# Patient Record
Sex: Male | Born: 1940
Health system: Southern US, Community
[De-identification: ages and names within clinical notes are randomized; demographics above are authoritative.]

## PROBLEM LIST (undated history)

## (undated) DIAGNOSIS — E785 Hyperlipidemia, unspecified: Secondary | ICD-10-CM

## (undated) DIAGNOSIS — N529 Male erectile dysfunction, unspecified: Secondary | ICD-10-CM

## (undated) DIAGNOSIS — Z860101 Personal history of adenomatous and serrated colon polyps: Secondary | ICD-10-CM

## (undated) DIAGNOSIS — Z8546 Personal history of malignant neoplasm of prostate: Secondary | ICD-10-CM

## (undated) DIAGNOSIS — N400 Enlarged prostate without lower urinary tract symptoms: Secondary | ICD-10-CM

## (undated) DIAGNOSIS — I10 Essential (primary) hypertension: Secondary | ICD-10-CM

## (undated) DIAGNOSIS — Z86010 Personal history of colonic polyps: Secondary | ICD-10-CM

## (undated) DIAGNOSIS — T7840XA Allergy, unspecified, initial encounter: Secondary | ICD-10-CM

## (undated) DIAGNOSIS — Z972 Presence of dental prosthetic device (complete) (partial): Secondary | ICD-10-CM

## (undated) DIAGNOSIS — M1990 Unspecified osteoarthritis, unspecified site: Secondary | ICD-10-CM

## (undated) HISTORY — DX: Male erectile dysfunction, unspecified: N52.9

## (undated) HISTORY — DX: Benign prostatic hyperplasia without lower urinary tract symptoms: N40.0

## (undated) HISTORY — DX: Essential (primary) hypertension: I10

## (undated) HISTORY — DX: Personal history of adenomatous and serrated colon polyps: Z86.0101

## (undated) HISTORY — DX: Hyperlipidemia, unspecified: E78.5

## (undated) HISTORY — DX: Personal history of malignant neoplasm of prostate: Z85.46

## (undated) HISTORY — DX: Personal history of colonic polyps: Z86.010

## (undated) HISTORY — DX: Unspecified osteoarthritis, unspecified site: M19.90

## (undated) HISTORY — DX: Allergy, unspecified, initial encounter: T78.40XA

---
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
## 1994-04-03 HISTORY — PX: FOOT FRACTURE SURGERY: SHX645

## 1999-05-25 ENCOUNTER — Encounter: Payer: Self-pay | Admitting: Internal Medicine

## 2004-04-06 ENCOUNTER — Ambulatory Visit: Payer: Self-pay | Admitting: Internal Medicine

## 2004-05-11 ENCOUNTER — Ambulatory Visit: Payer: Self-pay | Admitting: Family Medicine

## 2004-10-06 ENCOUNTER — Ambulatory Visit: Payer: Self-pay | Admitting: Internal Medicine

## 2004-10-06 ENCOUNTER — Emergency Department: Payer: Self-pay | Admitting: Emergency Medicine

## 2004-10-26 ENCOUNTER — Ambulatory Visit: Payer: Self-pay | Admitting: Internal Medicine

## 2004-11-09 ENCOUNTER — Ambulatory Visit: Payer: Self-pay | Admitting: Urology

## 2004-11-24 ENCOUNTER — Emergency Department: Payer: Self-pay | Admitting: Emergency Medicine

## 2004-11-28 ENCOUNTER — Ambulatory Visit: Payer: Self-pay | Admitting: Internal Medicine

## 2004-12-06 ENCOUNTER — Emergency Department: Payer: Self-pay | Admitting: Emergency Medicine

## 2004-12-09 ENCOUNTER — Emergency Department: Payer: Self-pay | Admitting: Unknown Physician Specialty

## 2004-12-14 ENCOUNTER — Ambulatory Visit: Payer: Self-pay | Admitting: Internal Medicine

## 2005-01-02 ENCOUNTER — Ambulatory Visit: Payer: Self-pay | Admitting: Internal Medicine

## 2005-02-07 ENCOUNTER — Ambulatory Visit: Payer: Self-pay | Admitting: Internal Medicine

## 2005-04-11 ENCOUNTER — Ambulatory Visit: Payer: Self-pay | Admitting: Internal Medicine

## 2005-07-21 ENCOUNTER — Ambulatory Visit: Payer: Self-pay | Admitting: Internal Medicine

## 2005-07-25 ENCOUNTER — Ambulatory Visit: Payer: Self-pay | Admitting: Internal Medicine

## 2005-07-27 ENCOUNTER — Encounter: Payer: Self-pay | Admitting: Internal Medicine

## 2005-08-19 ENCOUNTER — Ambulatory Visit: Payer: Self-pay | Admitting: Internal Medicine

## 2005-10-02 ENCOUNTER — Ambulatory Visit: Payer: Self-pay | Admitting: Orthopedic Surgery

## 2005-11-01 HISTORY — PX: KNEE ARTHROSCOPY: SHX127

## 2005-11-03 ENCOUNTER — Other Ambulatory Visit: Payer: Self-pay

## 2005-11-07 ENCOUNTER — Ambulatory Visit: Payer: Self-pay | Admitting: Internal Medicine

## 2005-11-09 ENCOUNTER — Ambulatory Visit: Payer: Self-pay | Admitting: Orthopedic Surgery

## 2005-12-08 ENCOUNTER — Ambulatory Visit: Payer: Self-pay | Admitting: Internal Medicine

## 2005-12-22 ENCOUNTER — Ambulatory Visit: Payer: Self-pay | Admitting: Orthopedic Surgery

## 2006-03-22 ENCOUNTER — Ambulatory Visit: Payer: Self-pay | Admitting: Internal Medicine

## 2006-08-30 ENCOUNTER — Encounter: Payer: Self-pay | Admitting: Internal Medicine

## 2006-11-06 ENCOUNTER — Encounter (INDEPENDENT_AMBULATORY_CARE_PROVIDER_SITE_OTHER): Payer: Self-pay | Admitting: *Deleted

## 2007-01-07 ENCOUNTER — Encounter (INDEPENDENT_AMBULATORY_CARE_PROVIDER_SITE_OTHER): Payer: Self-pay | Admitting: *Deleted

## 2007-03-04 ENCOUNTER — Encounter: Payer: Self-pay | Admitting: Internal Medicine

## 2007-03-07 DIAGNOSIS — J309 Allergic rhinitis, unspecified: Secondary | ICD-10-CM | POA: Insufficient documentation

## 2007-03-07 DIAGNOSIS — E119 Type 2 diabetes mellitus without complications: Secondary | ICD-10-CM

## 2007-03-07 DIAGNOSIS — F528 Other sexual dysfunction not due to a substance or known physiological condition: Secondary | ICD-10-CM

## 2007-03-07 DIAGNOSIS — D126 Benign neoplasm of colon, unspecified: Secondary | ICD-10-CM

## 2007-03-07 DIAGNOSIS — I10 Essential (primary) hypertension: Secondary | ICD-10-CM

## 2007-03-20 ENCOUNTER — Telehealth (INDEPENDENT_AMBULATORY_CARE_PROVIDER_SITE_OTHER): Payer: Self-pay | Admitting: *Deleted

## 2007-03-26 ENCOUNTER — Ambulatory Visit: Payer: Self-pay | Admitting: Internal Medicine

## 2007-04-08 LAB — CONVERTED CEMR LAB
Basophils Relative: 3.6 % — ABNORMAL HIGH (ref 0.0–1.0)
Chloride: 104 meq/L (ref 96–112)
Eosinophils Relative: 1.9 % (ref 0.0–5.0)
GFR calc Af Amer: 86 mL/min
GFR calc non Af Amer: 71 mL/min
Glucose, Bld: 107 mg/dL — ABNORMAL HIGH (ref 70–99)
Microalb Creat Ratio: 3.9 mg/g (ref 0.0–30.0)
Phosphorus: 3.9 mg/dL (ref 2.3–4.6)
Platelets: 199 10*3/uL (ref 150–400)
Potassium: 3.8 meq/L (ref 3.5–5.1)
RBC: 3.85 M/uL — ABNORMAL LOW (ref 4.22–5.81)
Sodium: 141 meq/L (ref 135–145)
TSH: 1.37 microintl units/mL (ref 0.35–5.50)
Total CHOL/HDL Ratio: 3.9
Triglycerides: 76 mg/dL (ref 0–149)
VLDL: 15 mg/dL (ref 0–40)
WBC: 5.1 10*3/uL (ref 4.5–10.5)

## 2007-04-25 ENCOUNTER — Ambulatory Visit: Payer: Self-pay | Admitting: Internal Medicine

## 2007-05-05 LAB — HM DIABETES EYE EXAM: HM Diabetic Eye Exam: NORMAL

## 2007-05-20 ENCOUNTER — Encounter: Payer: Self-pay | Admitting: Internal Medicine

## 2007-05-29 ENCOUNTER — Ambulatory Visit: Payer: Self-pay | Admitting: Internal Medicine

## 2007-06-18 ENCOUNTER — Telehealth: Payer: Self-pay | Admitting: Internal Medicine

## 2007-07-04 ENCOUNTER — Encounter: Payer: Self-pay | Admitting: Internal Medicine

## 2007-07-24 ENCOUNTER — Telehealth: Payer: Self-pay | Admitting: Internal Medicine

## 2007-08-01 ENCOUNTER — Encounter: Payer: Self-pay | Admitting: Internal Medicine

## 2007-08-28 ENCOUNTER — Ambulatory Visit: Payer: Self-pay | Admitting: Unknown Physician Specialty

## 2007-08-28 ENCOUNTER — Other Ambulatory Visit: Payer: Self-pay

## 2007-08-30 ENCOUNTER — Ambulatory Visit: Payer: Self-pay | Admitting: Internal Medicine

## 2007-09-02 HISTORY — PX: SINUS SURGERY WITH INSTATRAK: SHX5215

## 2007-09-10 ENCOUNTER — Ambulatory Visit: Payer: Self-pay | Admitting: Unknown Physician Specialty

## 2007-09-21 ENCOUNTER — Emergency Department: Payer: Self-pay | Admitting: Unknown Physician Specialty

## 2007-10-08 ENCOUNTER — Ambulatory Visit: Payer: Self-pay | Admitting: Internal Medicine

## 2007-10-08 DIAGNOSIS — N400 Enlarged prostate without lower urinary tract symptoms: Secondary | ICD-10-CM | POA: Insufficient documentation

## 2007-10-08 DIAGNOSIS — E785 Hyperlipidemia, unspecified: Secondary | ICD-10-CM

## 2007-10-09 LAB — CONVERTED CEMR LAB
Albumin: 4 g/dL (ref 3.5–5.2)
Basophils Absolute: 0 10*3/uL (ref 0.0–0.1)
Basophils Relative: 0.6 % (ref 0.0–1.0)
CO2: 28 meq/L (ref 19–32)
Chloride: 98 meq/L (ref 96–112)
Cholesterol: 175 mg/dL (ref 0–200)
Eosinophils Absolute: 0.1 10*3/uL (ref 0.0–0.7)
GFR calc non Af Amer: 79 mL/min
Glucose, Bld: 156 mg/dL — ABNORMAL HIGH (ref 70–99)
Hemoglobin: 13.8 g/dL (ref 13.0–17.0)
Hgb A1c MFr Bld: 7.1 % — ABNORMAL HIGH (ref 4.6–6.0)
LDL Cholesterol: 112 mg/dL — ABNORMAL HIGH (ref 0–99)
MCHC: 34.2 g/dL (ref 30.0–36.0)
MCV: 99.4 fL (ref 78.0–100.0)
Neutro Abs: 1.8 10*3/uL (ref 1.4–7.7)
Neutrophils Relative %: 46.2 % (ref 43.0–77.0)
RBC: 4.06 M/uL — ABNORMAL LOW (ref 4.22–5.81)
RDW: 13.8 % (ref 11.5–14.6)
Sodium: 135 meq/L (ref 135–145)
TSH: 0.88 microintl units/mL (ref 0.35–5.50)
Total Bilirubin: 1.3 mg/dL — ABNORMAL HIGH (ref 0.3–1.2)
Triglycerides: 95 mg/dL (ref 0–149)
VLDL: 19 mg/dL (ref 0–40)

## 2007-10-10 ENCOUNTER — Telehealth: Payer: Self-pay | Admitting: Internal Medicine

## 2007-10-14 ENCOUNTER — Telehealth (INDEPENDENT_AMBULATORY_CARE_PROVIDER_SITE_OTHER): Payer: Self-pay | Admitting: *Deleted

## 2007-10-21 ENCOUNTER — Telehealth: Payer: Self-pay | Admitting: Internal Medicine

## 2007-10-21 ENCOUNTER — Ambulatory Visit: Payer: Self-pay | Admitting: Internal Medicine

## 2007-10-21 DIAGNOSIS — G479 Sleep disorder, unspecified: Secondary | ICD-10-CM | POA: Insufficient documentation

## 2007-10-30 ENCOUNTER — Encounter: Payer: Self-pay | Admitting: Internal Medicine

## 2007-11-22 ENCOUNTER — Ambulatory Visit: Payer: Self-pay | Admitting: Internal Medicine

## 2007-11-25 ENCOUNTER — Telehealth (INDEPENDENT_AMBULATORY_CARE_PROVIDER_SITE_OTHER): Payer: Self-pay | Admitting: *Deleted

## 2008-01-20 ENCOUNTER — Telehealth: Payer: Self-pay | Admitting: Internal Medicine

## 2008-02-18 ENCOUNTER — Ambulatory Visit: Payer: Self-pay | Admitting: Internal Medicine

## 2008-04-17 ENCOUNTER — Ambulatory Visit: Payer: Self-pay | Admitting: Internal Medicine

## 2008-04-20 ENCOUNTER — Encounter: Payer: Self-pay | Admitting: Internal Medicine

## 2008-04-20 LAB — CONVERTED CEMR LAB
ALT: 27 units/L (ref 0–53)
Basophils Absolute: 0.1 10*3/uL (ref 0.0–0.1)
Chloride: 102 meq/L (ref 96–112)
Cholesterol: 165 mg/dL (ref 0–200)
Creatinine,U: 191.5 mg/dL
Eosinophils Absolute: 0.1 10*3/uL (ref 0.0–0.7)
GFR calc Af Amer: 108 mL/min
Glucose, Bld: 104 mg/dL — ABNORMAL HIGH (ref 70–99)
HCT: 40.7 % (ref 39.0–52.0)
Hemoglobin: 14.4 g/dL (ref 13.0–17.0)
MCHC: 35.3 g/dL (ref 30.0–36.0)
MCV: 97.7 fL (ref 78.0–100.0)
Microalb, Ur: 1.4 mg/dL (ref 0.0–1.9)
Monocytes Absolute: 0.4 10*3/uL (ref 0.1–1.0)
Monocytes Relative: 7.4 % (ref 3.0–12.0)
Neutro Abs: 2.2 10*3/uL (ref 1.4–7.7)
Phosphorus: 3.3 mg/dL (ref 2.3–4.6)
Potassium: 3.6 meq/L (ref 3.5–5.1)
RDW: 13.1 % (ref 11.5–14.6)
Sodium: 139 meq/L (ref 135–145)
TSH: 0.98 microintl units/mL (ref 0.35–5.50)
Total Bilirubin: 1.7 mg/dL — ABNORMAL HIGH (ref 0.3–1.2)
Triglycerides: 57 mg/dL (ref 0–149)

## 2008-05-18 ENCOUNTER — Telehealth: Payer: Self-pay | Admitting: Internal Medicine

## 2008-07-13 ENCOUNTER — Telehealth: Payer: Self-pay | Admitting: Internal Medicine

## 2008-07-28 ENCOUNTER — Encounter: Payer: Self-pay | Admitting: Internal Medicine

## 2008-08-14 ENCOUNTER — Telehealth: Payer: Self-pay | Admitting: Internal Medicine

## 2008-09-08 ENCOUNTER — Telehealth: Payer: Self-pay | Admitting: Internal Medicine

## 2008-09-10 ENCOUNTER — Ambulatory Visit: Payer: Self-pay | Admitting: Internal Medicine

## 2008-09-21 ENCOUNTER — Telehealth: Payer: Self-pay | Admitting: Internal Medicine

## 2008-10-19 ENCOUNTER — Encounter: Payer: Self-pay | Admitting: Internal Medicine

## 2008-11-10 ENCOUNTER — Telehealth: Payer: Self-pay | Admitting: Internal Medicine

## 2008-11-18 ENCOUNTER — Encounter: Payer: Self-pay | Admitting: Internal Medicine

## 2008-11-25 ENCOUNTER — Encounter: Payer: Self-pay | Admitting: Internal Medicine

## 2008-12-02 ENCOUNTER — Ambulatory Visit: Payer: Self-pay | Admitting: Radiation Oncology

## 2008-12-08 ENCOUNTER — Telehealth: Payer: Self-pay | Admitting: Internal Medicine

## 2008-12-14 ENCOUNTER — Encounter: Payer: Self-pay | Admitting: Internal Medicine

## 2008-12-21 ENCOUNTER — Encounter: Payer: Self-pay | Admitting: Internal Medicine

## 2008-12-21 ENCOUNTER — Ambulatory Visit: Payer: Self-pay | Admitting: Radiation Oncology

## 2008-12-22 ENCOUNTER — Ambulatory Visit: Payer: Self-pay | Admitting: Internal Medicine

## 2008-12-22 DIAGNOSIS — Z8546 Personal history of malignant neoplasm of prostate: Secondary | ICD-10-CM

## 2008-12-24 LAB — CONVERTED CEMR LAB
Basophils Absolute: 0 10*3/uL (ref 0.0–0.1)
CO2: 31 meq/L (ref 19–32)
Calcium: 9.1 mg/dL (ref 8.4–10.5)
Eosinophils Absolute: 0.1 10*3/uL (ref 0.0–0.7)
HCT: 40.9 % (ref 39.0–52.0)
HDL: 45 mg/dL (ref >39.00)
Hemoglobin: 14 g/dL (ref 13.0–17.0)
Lymphs Abs: 2 10*3/uL (ref 0.7–4.0)
MCHC: 34.1 g/dL (ref 30.0–36.0)
Microalb Creat Ratio: 2.3 mg/g (ref 0.0–30.0)
Neutro Abs: 2 10*3/uL (ref 1.4–7.7)
Potassium: 3.7 meq/L (ref 3.5–5.1)
RDW: 12.7 % (ref 11.5–14.6)
Sodium: 140 meq/L (ref 135–145)
Total Bilirubin: 2.4 mg/dL — ABNORMAL HIGH (ref 0.3–1.2)
Total CHOL/HDL Ratio: 3
VLDL: 15.4 mg/dL (ref 0.0–40.0)

## 2008-12-30 ENCOUNTER — Ambulatory Visit: Payer: Self-pay | Admitting: Internal Medicine

## 2008-12-30 ENCOUNTER — Telehealth: Payer: Self-pay | Admitting: Internal Medicine

## 2009-01-01 ENCOUNTER — Ambulatory Visit: Payer: Self-pay | Admitting: Radiation Oncology

## 2009-01-01 HISTORY — PX: PROSTATE SURGERY: SHX751

## 2009-01-12 ENCOUNTER — Ambulatory Visit: Payer: Self-pay | Admitting: Radiation Oncology

## 2009-01-13 ENCOUNTER — Telehealth: Payer: Self-pay | Admitting: Internal Medicine

## 2009-02-01 ENCOUNTER — Ambulatory Visit: Payer: Self-pay | Admitting: Radiation Oncology

## 2009-02-02 ENCOUNTER — Ambulatory Visit: Payer: Self-pay | Admitting: Urology

## 2009-02-04 ENCOUNTER — Ambulatory Visit: Payer: Self-pay | Admitting: Cardiology

## 2009-02-09 ENCOUNTER — Ambulatory Visit: Payer: Self-pay | Admitting: Radiation Oncology

## 2009-02-17 ENCOUNTER — Telehealth: Payer: Self-pay | Admitting: Internal Medicine

## 2009-03-03 ENCOUNTER — Ambulatory Visit: Payer: Self-pay | Admitting: Radiation Oncology

## 2009-04-03 ENCOUNTER — Ambulatory Visit: Payer: Self-pay | Admitting: Radiation Oncology

## 2009-04-21 ENCOUNTER — Encounter: Payer: Self-pay | Admitting: Internal Medicine

## 2009-05-04 ENCOUNTER — Telehealth: Payer: Self-pay | Admitting: Internal Medicine

## 2009-06-21 ENCOUNTER — Ambulatory Visit: Payer: Self-pay | Admitting: Internal Medicine

## 2009-06-22 LAB — CONVERTED CEMR LAB
AST: 33 units/L (ref 0–37)
Albumin: 3.9 g/dL (ref 3.5–5.2)
Alkaline Phosphatase: 58 units/L (ref 39–117)
BUN: 16 mg/dL (ref 6–23)
Basophils Absolute: 0 10*3/uL (ref 0.0–0.1)
Bilirubin, Direct: 0.2 mg/dL (ref 0.0–0.3)
CO2: 29 meq/L (ref 19–32)
Cholesterol: 151 mg/dL (ref 0–200)
Creatinine, Ser: 0.9 mg/dL (ref 0.4–1.5)
Creatinine,U: 181.7 mg/dL
Eosinophils Absolute: 0.1 10*3/uL (ref 0.0–0.7)
Glucose, Bld: 124 mg/dL — ABNORMAL HIGH (ref 70–99)
Lymphs Abs: 1.2 10*3/uL (ref 0.7–4.0)
MCHC: 34 g/dL (ref 30.0–36.0)
MCV: 99.8 fL (ref 78.0–100.0)
Microalb Creat Ratio: 8.8 mg/g (ref 0.0–30.0)
Monocytes Absolute: 0.3 10*3/uL (ref 0.1–1.0)
Neutrophils Relative %: 52 % (ref 43.0–77.0)
Platelets: 179 10*3/uL (ref 150.0–400.0)
RDW: 13.3 % (ref 11.5–14.6)
TSH: 1.19 microintl units/mL (ref 0.35–5.50)
Total CHOL/HDL Ratio: 3
Triglycerides: 54 mg/dL (ref 0.0–149.0)
WBC: 3.4 10*3/uL — ABNORMAL LOW (ref 4.5–10.5)

## 2009-06-23 ENCOUNTER — Encounter: Payer: Self-pay | Admitting: Internal Medicine

## 2009-06-30 ENCOUNTER — Encounter (INDEPENDENT_AMBULATORY_CARE_PROVIDER_SITE_OTHER): Payer: Self-pay | Admitting: *Deleted

## 2009-07-16 ENCOUNTER — Telehealth: Payer: Self-pay | Admitting: Internal Medicine

## 2009-08-09 ENCOUNTER — Encounter: Payer: Self-pay | Admitting: Internal Medicine

## 2009-09-10 ENCOUNTER — Telehealth: Payer: Self-pay | Admitting: Internal Medicine

## 2009-12-16 ENCOUNTER — Encounter: Payer: Self-pay | Admitting: Internal Medicine

## 2009-12-20 ENCOUNTER — Telehealth: Payer: Self-pay | Admitting: Internal Medicine

## 2009-12-22 ENCOUNTER — Ambulatory Visit: Payer: Self-pay | Admitting: Internal Medicine

## 2009-12-22 DIAGNOSIS — M1990 Unspecified osteoarthritis, unspecified site: Secondary | ICD-10-CM | POA: Insufficient documentation

## 2009-12-22 LAB — HM DIABETES FOOT EXAM

## 2009-12-23 LAB — CONVERTED CEMR LAB: Hgb A1c MFr Bld: 6.3 % (ref 4.6–6.5)

## 2010-01-20 ENCOUNTER — Encounter: Payer: Self-pay | Admitting: Internal Medicine

## 2010-02-15 ENCOUNTER — Encounter (INDEPENDENT_AMBULATORY_CARE_PROVIDER_SITE_OTHER): Payer: Self-pay | Admitting: *Deleted

## 2010-02-17 ENCOUNTER — Ambulatory Visit: Payer: Self-pay | Admitting: Internal Medicine

## 2010-02-22 ENCOUNTER — Telehealth: Payer: Self-pay | Admitting: Internal Medicine

## 2010-03-03 ENCOUNTER — Ambulatory Visit: Payer: Self-pay | Admitting: Internal Medicine

## 2010-04-29 ENCOUNTER — Telehealth: Payer: Self-pay | Admitting: Internal Medicine

## 2010-05-04 NOTE — Letter (Signed)
Summary: Pre Visit No Show Letter  [facility]  [address] [place], [place] [address]   Phone: [phone]  Fax: [phone]        Aug 09, 2009 MRN: [id number]    [facility] [address] [place], [place]  [address]    Dear Rodney Suarez,   We have been unable to reach you by phone concerning the pre-procedure visit that you missed on 08/09/09. For this reason,your procedure scheduled on 08/23/09 has been cancelled. Our scheduling staff will gladly assist you with rescheduling your appointments at a more convenient time. Please call our office at [phone] between the hours of 8:00am and 5:00pm, press option #2 to reach an appointment scheduler. Please consider updating your contact numbers at this time so that we can reach you by phone in the future with schedule changes or results.    Thank you,    Wyona Almas RN [facility]

## 2010-05-04 NOTE — Assessment & Plan Note (Signed)
Summary: FOLLOW UP / LFW   Vital Signs:  Patient profile:   70 year old male Height:      72 inches Weight:      213 pounds BMI:     28.99 Temp:     98.7 degrees F oral Pulse rate:   72 / minute Pulse rhythm:   regular BP sitting:   160 / 100  (right arm) Cuff size:   large  Vitals Entered By: Mervin Hack CMA Duncan Dull) (December 22, 2009 10:36 AM) CC: six month follow up   History of Present Illness: DOing well Has had recent follow up with Dr Cope---has done well PSA very low BP was fine there  got disturbing phone call before visit may be reason for elevated BP today  checks sugars 3-4 times per week Usually under 110 Has cut out junk in diet and lost 12# rides bike daily Has eye appt  No depression Rarely needs lorazepam in day  Allergies: 1)  Riomet (Metformin Hcl)  Past History:  Past medical, surgical, family and social histories (including risk factors) reviewed for relevance to current acute and chronic problems.  Past Medical History: Allergic rhinitis Diabetes mellitus, type II Hypertension Colonic polyps, hx of--adenomatous Erectile dysfunction Benign prostatic hypertrophy Hyperlipidemia Prostate cancer, hx of----------------------------------------Dr Cope Osteoarthritis  Past Surgical History: Reviewed history from 06/21/2009 and no changes required. Appendectomy 1956 Fx right foot 1996 Right knee arthroscopy 08/07 Sinus surgery 6/09 I-131 implants 10/10 for prostate cancer  Family History: Reviewed history from 03/07/2007 and no changes required. Dad died in MVA Mom okay DM in family No CAD No cancer  Social History: Reviewed history from 03/07/2007 and no changes required. 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   Neck:  supple, no masses, no thyromegaly, no carotid bruits, and no cervical lymphadenopathy.   Lungs:  normal respiratory effort, no intercostal retractions, no accessory muscle use, and normal breath sounds.   Heart:  normal rate, regular rhythm, no murmur, and no gallop.   Msk:  no joint tenderness and no joint swelling.   Pulses:  2+ in each foot Extremities:  no edema Psych:  normally interactive, good eye contact, not anxious appearing, and not depressed appearing.    Diabetes Management Exam:    Foot Exam (with socks and/or shoes not present):       Sensory-Pinprick/Light touch:          Left medial foot (L-4): normal          Left dorsal foot (L-5): normal          Left lateral foot (S-1): normal          Right medial foot (L-4): normal          Right dorsal foot (L-5): normal          Right lateral foot (S-1): normal       Inspection:          Left foot: normal          Right foot: normal       Nails:          Left foot: normal          Right foot: normal   Impression &  Recommendations:  Problem # 1:  DIABETES MELLITUS, TYPE II (ICD-250.00) Assessment Unchanged  good control without meds will check A1c  His updated medication list for this problem includes:    Lisinopril-hydrochlorothiazide 20-12.5 Mg Tabs (Lisinopril-hydrochlorothiazide) .Marland Kitchen... Take 2 tablet by mouth once a day  Labs Reviewed: Creat: 0.9 (06/21/2009)     Last Eye Exam: normal (05/05/2007) Reviewed HgBA1c results: 6.4 (06/21/2009)  6.0 (12/22/2008)  Orders: Venipuncture (91478) TLB-A1C / Hgb A1C (Glycohemoglobin) (83036-A1C)  Problem # 2:  HYPERTENSION (ICD-401.9) Assessment: Comment Only up today but has been okay no changes  His updated medication list for this problem includes:    Lisinopril-hydrochlorothiazide 20-12.5 Mg Tabs (Lisinopril-hydrochlorothiazide) .Marland Kitchen... Take 2 tablet by mouth once a day    Norvasc 5 Mg Tabs (Amlodipine  besylate) .Marland Kitchen... Take 1 tablet by mouth once a day  BP today: 160/100 Prior BP: 132/78 (06/21/2009)  Labs Reviewed: K+: 3.6 (06/21/2009) Creat: : 0.9 (06/21/2009)   Chol: 151 (06/21/2009)   HDL: 59.90 (06/21/2009)   LDL: 80 (06/21/2009)   TG: 54.0 (06/21/2009)  Problem # 3:  HYPERLIPIDEMIA (ICD-272.4) Assessment: Unchanged at goal recheck next time  His updated medication list for this problem includes:    Pravastatin Sodium 20 Mg Tabs (Pravastatin sodium) .Marland Kitchen... Take one tablet by mouth once daily  Labs Reviewed: SGOT: 33 (06/21/2009)   SGPT: 40 (06/21/2009)   HDL:59.90 (06/21/2009), 45.00 (12/22/2008)  LDL:80 (06/21/2009), 87 (12/22/2008)  Chol:151 (06/21/2009), 147 (12/22/2008)  Trig:54.0 (06/21/2009), 77.0 (12/22/2008)  Problem # 4:  SLEEP DISORDER (ICD-780.50) Assessment: Unchanged uses lorazepam as needed   Problem # 5:  OSTEOARTHRITIS (ICD-715.90) Assessment: Improved knee better with weight loss  Complete Medication List: 1)  Lisinopril-hydrochlorothiazide 20-12.5 Mg Tabs (Lisinopril-hydrochlorothiazide) .... Take 2 tablet by mouth once a day 2)  Norvasc 5 Mg Tabs (Amlodipine besylate) .... Take 1 tablet by mouth once a day 3)  Pravastatin Sodium 20 Mg Tabs (Pravastatin sodium) .... Take one tablet by mouth once daily 4)  Lorazepam 1 Mg Tabs (Lorazepam) .Marland Kitchen.. 1-2 at bedtime to help sleep 5)  Viagra 100 Mg Tabs (Sildenafil citrate) .... Administer 1/2-1 tablet by mouth once a day 6)  Ketoconazole 2 % Crea (Ketoconazole) .... Apply two times a day to rash till clear  Other Orders: Flu Vaccine 59yrs + [phone]) Admin 1st Vaccine (13086) Admin 1st Vaccine [facility]) [phone])  Patient Instructions: 1)  Please schedule a follow-up appointment in 6 months for physical  Current Allergies (reviewed today): RIOMET (METFORMIN HCL)   Influenza Vaccine    Vaccine Type: Fluvax 3+    Site: left deltoid    Mfr: GlaxoSmithKline    Dose: 0.5 ml    Route: IM    Given by: Mervin Hack CMA (AAMA)    Exp. Date: 10/01/2010    Lot #: [id number]    VIS given: 10/26/09 version given December 22, 2009.  Flu Vaccine Consent Questions    Do you have a history of severe allergic reactions to this vaccine? no    Any prior history of allergic reactions to egg and/or gelatin? no    Do you have a sensitivity to the preservative Thimersol? no    Do you have a past history of Guillan-Barre Syndrome? no    Do you currently have an acute febrile illness? no    Have you ever had a severe reaction to latex? no    Vaccine information given and explained to patient? yes

## 2010-05-04 NOTE — Letter (Signed)
Summary: [facility] Instructions  [facility]  [address] [place], [place] [address]   Phone: [phone]  Fax: [phone]       Rodney Suarez    06/06/1940    MRN: [id number]        Procedure Day /Date:  Thursday 03/03/2010     Arrival Time:  8:30 am      Procedure Time: 9:30 am     Location of Procedure:                    _x _  [facility] (4th Floor)                        PREPARATION FOR COLONOSCOPY WITH MOVIPREP   Starting 5 days prior to your procedure Saturday 11/26 do not eat nuts, seeds, popcorn, corn, beans, peas,  salads, or any raw vegetables.  Do not take any fiber supplements (e.g. Metamucil, Citrucel, and Benefiber).  THE DAY BEFORE YOUR PROCEDURE         DATE: Wednesday 11/30  1.  Drink clear liquids the entire day-NO SOLID FOOD  2.  Do not drink anything colored red or purple.  Avoid juices with pulp.  No orange juice.  3.  Drink at least 64 oz. (8 glasses) of fluid/clear liquids during the day to prevent dehydration and help the prep work efficiently.  CLEAR LIQUIDS INCLUDE: Water Jello Ice Popsicles Tea (sugar ok, no milk/cream) Powdered fruit flavored drinks Coffee (sugar ok, no milk/cream) Gatorade Juice: apple, white grape, white cranberry  Lemonade Clear bullion, consomm, broth Carbonated beverages (any kind) Strained chicken noodle soup Hard Candy                             4.  In the morning, mix first dose of MoviPrep solution:    Empty 1 Pouch A and 1 Pouch B into the disposable container    Add lukewarm drinking water to the top line of the container. Mix to dissolve    Refrigerate (mixed solution should be used within 24 hrs)  5.  Begin drinking the prep at 5:00 p.m. The MoviPrep container is divided by 4 marks.   Every 15 minutes drink the solution down to the next mark (approximately 8 oz) until the full liter is complete.   6.  Follow completed prep with 16 oz of clear liquid of your choice  (Nothing red or purple).  Continue to drink clear liquids until bedtime.  7.  Before going to bed, mix second dose of MoviPrep solution:    Empty 1 Pouch A and 1 Pouch B into the disposable container    Add lukewarm drinking water to the top line of the container. Mix to dissolve    Refrigerate  THE DAY OF YOUR PROCEDURE      DATE: Thursday 12/1  Beginning at 4:30 a.m. (5 hours before procedure):         1. Every 15 minutes, drink the solution down to the next mark (approx 8 oz) until the full liter is complete.  2. Follow completed prep with 16 oz. of clear liquid of your choice.    3. You may drink clear liquids until 7:30 am (2 HOURS BEFORE PROCEDURE).   MEDICATION INSTRUCTIONS  Unless otherwise instructed, you should take regular prescription medications with a small sip of water   as early as possible the morning  of your procedure.   Additional medication instructions: _HOLD your lisinopril/HCTZ the am of your procedure         OTHER INSTRUCTIONS  You will need a responsible adult at least 71 years of age to accompany you and drive you home.   This person must remain in the waiting room during your procedure.  Wear loose fitting clothing that is easily removed.  Leave jewelry and other valuables at home.  However, you may wish to bring a book to read or  an iPod/MP3 player to listen to music as you wait for your procedure to start.  Remove all body piercing jewelry and leave at home.  Total time from sign-in until discharge is approximately 2-3 hours.  You should go home directly after your procedure and rest.  You can resume normal activities the  day after your procedure.  The day of your procedure you should not:   Drive   Make legal decisions   Operate machinery   Drink alcohol   Return to work  You will receive specific instructions about eating, activities and medications before you leave.    The above instructions have been reviewed and  explained to me by   Clide Cliff, RN______________________    I fully understand and can verbalize these instructions _____________________________ Date _________

## 2010-05-04 NOTE — Progress Notes (Signed)
Summary: refill request for lorazepam  Phone Note Refill Request Message from:  Fax from Pharmacy  Refills Requested: Medication #1:  LORAZEPAM 1 MG  TABS 1-2 at bedtime to help sleep   Last Refilled: 01/22/2010 [facility] [address] [facility] on your desk.  Initial call taken by: Lowella Petties CMA, AAMA,  February 22, 2010 8:20 AM  Follow-up for Phone Call        okay #60 x 1 Follow-up by: Cindee Salt MD,  February 22, 2010 8:54 AM  Additional Follow-up for Phone Call Additional follow up Details #1::        Rx faxed to pharmacy Additional Follow-up by: DeShannon Holden CMA Duncan Dull),  February 22, 2010 10:00 AM    Prescriptions: LORAZEPAM 1 MG  TABS (LORAZEPAM) 1-2 at bedtime to help sleep  #60 x 1   Entered by:   Mervin Hack CMA (AAMA)   Authorized by:   Cindee Salt MD   Signed by:   Mervin Hack CMA (AAMA) on 02/22/2010   Method used:   Handwritten   RxID:   [id number]

## 2010-05-04 NOTE — Letter (Signed)
Summary: Pre Visit Letter Revised  [facility]  [address] [place], [place] [address]   Phone: [phone]  Fax: [phone]        01/20/2010 MRN: [id number]  [facility] [address] [place], [place]  [address]                            Procedure Date:  12-1at 9:30am Welcome to the Gastroenterology Division at [facility].    You are scheduled to see a nurse for your pre-procedure visit on 02-17-10 at 10am on the 3rd floor at [facility], [address]  We ask that you try to arrive at our office 15 minutes prior to your appointment time to allow for check-in.  Please take a minute to review the attached form.  If you answer "Yes" to one or more of the questions on the first page, we ask that you call the person listed at your earliest opportunity.  If you answer "No" to all of the questions, please complete the rest of the form and bring it to your appointment.    Your nurse visit will consist of discussing your medical and surgical history, your immediate family medical history, and your medications.   If you are unable to list all of your medications on the form, please bring the medication bottles to your appointment and we will list them.  We will need to be aware of both prescribed and over the counter drugs.  We will need to know exact dosage information as well.    Please be prepared to read and sign documents such as consent forms, a financial agreement, and acknowledgement forms.  If necessary, and with your consent, a friend or relative is welcome to sit-in on the nurse visit with you.  Please bring your insurance card so that we may make a copy of it.  If your insurance requires a referral to see a specialist, please bring your referral form from your primary care physician.  No co-pay is required for this nurse visit.     If you cannot keep your appointment, please call [phone] to cancel or reschedule prior to your appointment date.   This allows [place] the opportunity to schedule an appointment for another patient in need of care.    Thank you for choosing  Gastroenterology for your medical needs.  We appreciate the opportunity to care for you.  Please visit [place] at our website  to learn more about our practice.  Sincerely, The Gastroenterology Division

## 2010-05-04 NOTE — Letter (Signed)
Summary: [facility]  [facility]   Imported By: Lanelle Bal 12/28/2009 08:32:18  _____________________________________________________________________  External Attachment:    Type:   Image     Comment:   External Document  Appended Document: [facility] Prostate cancer follow up PSA drawn SOme issues with ED as well

## 2010-05-04 NOTE — Letter (Signed)
Summary: [facility]  [facility]   Imported By: Lanelle Bal 04/27/2009 13:57:03  _____________________________________________________________________  External Attachment:    Type:   Image     Comment:   External Document  Appended Document: [facility] doing well after prostate brachytherapy viagra sample given

## 2010-05-04 NOTE — Progress Notes (Signed)
Summary: LORAZEPAM  Phone Note [facility] 04[facility] 3:55 PM  Refills Requested: Medication #1:  LORAZEPAM 1 MG  TABS 1-2 at bedtime to help sleep   Last Refilled: 02/17/2009 Form on your desk    Method Requested: Fax to Local Pharmacy Initial call taken by: DeShannon Colvin CMA Duncan Dull),  May 04, 2009 3:55 PM  Follow-up for Phone Call        okay #60 x 1 Follow-up by: Cindee Salt MD,  May 05, 2009 10:26 AM  Additional Follow-up for Phone Call Additional follow up Details #1::        Rx faxed to pharmacy Additional Follow-up by: DeShannon Moilanen CMA Duncan Dull),  May 05, 2009 10:50 AM    Prescriptions: LORAZEPAM 1 MG  TABS (LORAZEPAM) 1-2 at bedtime to help sleep  #60 x 1   Entered by:   Mervin Hack CMA (AAMA)   Authorized by:   Cindee Salt MD   Signed by:   Mervin Hack CMA (AAMA) on 05/05/2009   Method used:   Handwritten   RxID:   [id number]

## 2010-05-04 NOTE — Miscellaneous (Signed)
Summary: RECALL COLON/YF  Clinical Lists Changes  Medications: Added new medication of MOVIPREP 100 GM  SOLR (PEG-KCL-NACL-NASULF-NA ASC-C) As directed - Signed Rx of MOVIPREP 100 GM  SOLR (PEG-KCL-NACL-NASULF-NA ASC-C) As directed;  #1 x 0;  Signed;  Entered by: Clide Cliff RN;  Authorized by: Hilarie Fredrickson MD;  Method used: [facility]. [phone]*, [address], [place], [place], [place]  [address], Ph: [phone] or [phone], Fax: [phone] Observations: Added new observation of ALLERGY REV: Done (02/17/2010 9:41)    Prescriptions: MOVIPREP 100 GM  SOLR (PEG-KCL-NACL-NASULF-NA ASC-C) As directed  #1 x 0   Entered by:   Clide Cliff RN   Authorized by:   Hilarie Fredrickson MD   Signed by:   Clide Cliff RN on 02/17/2010   Method used:   [facility] to        [facility] [phone]* (retail)       [address] [place], [place]  [address]       Ph: [phone] or [phone]       Fax: [phone]   RxID:   [id number]

## 2010-05-04 NOTE — Assessment & Plan Note (Signed)
Summary: FOLLOW UP / LFW   Vital Signs:  Patient profile:   70 year old male Weight:      225 pounds BMI:     30.63 Temp:     98 degrees F oral Pulse rate:   64 / minute Pulse rhythm:   regular BP sitting:   132 / 78  (left arm) Cuff size:   large  Vitals Entered By: Mervin Hack CMA Duncan Dull) (June 21, 2009 10:34 AM) CC: 6 month follow-up   History of Present Illness: Had implants put in in October went well PSA down now No apparent side effects---no pelvic pain, no rectal bleeding No urinary problems Nocturia only once--rarely twice  Checking sugars every morning Doing great  105-115 Has cut out all the junk Rides bike fairly regularly Due for eye exam  No chest pain  No SOB No edema  Allergies: 1)  Riomet (Metformin Hcl)  Past History:  Past medical, surgical, family and social histories (including risk factors) reviewed for relevance to current acute and chronic problems.  Past Medical History: Reviewed history from 12/22/2008 and no changes required. Allergic rhinitis Diabetes mellitus, type II Hypertension Colonic polyps, hx of--adenomatous Erectile dysfunction Benign prostatic hypertrophy Hyperlipidemia Prostate cancer, hx of----------------------------------------Dr Achilles Dunk  Past Surgical History: Appendectomy 1956 Fx right foot 1996 Right knee arthroscopy 08/07 Sinus surgery 6/09 I-131 implants 10/10 for prostate cancer  Family History: Reviewed history from 03/07/2007 and no changes required. Dad died in MVA Mom okay DM in family No CAD No cancer  Social History: Reviewed history from 03/07/2007 and no changes required. 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   Neck:  supple, no masses, no thyromegaly, no carotid bruits, and no cervical lymphadenopathy.   Lungs:  normal respiratory effort and normal breath sounds.   Heart:  normal rate, regular rhythm, no murmur, and no gallop.   Abdomen:  soft and non-tender.   Msk:  no joint tenderness and no joint swelling.   Skin:  no suspicious lesions and no ulcerations.   Psych:  normally interactive, good eye contact, not anxious appearing, and not depressed appearing.    Diabetes Management Exam:    Foot Exam (with socks and/or shoes not present):       Sensory-Pinprick/Light touch:          Left medial foot (L-4): normal          Left dorsal foot (L-5): normal          Left lateral foot (S-1): normal          Right medial foot (L-4): normal          Right dorsal foot (L-5): normal          Right lateral foot (S-1): normal       Inspection:          Left foot: normal          Right foot: normal       Nails:          Left foot: normal          Right foot: normal   Impression & Recommendations:  Problem # 1:  DIABETES MELLITUS, TYPE II (ICD-250.00) Assessment Comment Only  seems to be controlled without meds will check labs getting eye exam soon  His updated medication list for this problem includes:    Lisinopril-hydrochlorothiazide 20-12.5 Mg Tabs (Lisinopril-hydrochlorothiazide) .Marland Kitchen... Take 2 tablet by mouth once a day  Labs Reviewed: Creat: 0.9 (12/22/2008)     Last Eye Exam: normal (05/05/2007) Reviewed HgBA1c results: 6.0 (12/22/2008)  6.3 (04/17/2008)  Orders: TLB-Microalbumin/Creat Ratio, Urine (82043-MALB) TLB-A1C / Hgb A1C (Glycohemoglobin) (83036-A1C)  Problem # 2:  HYPERLIPIDEMIA (ICD-272.4) Assessment: Unchanged  no problems with meds will check labs  His updated medication list for this problem includes:    Pravastatin Sodium 20 Mg Tabs (Pravastatin sodium) .Marland Kitchen... Take one tablet by mouth once daily  Labs  Reviewed: SGOT: 34 (12/22/2008)   SGPT: 36 (12/22/2008)   HDL:45.00 (12/22/2008), 55.1 (04/17/2008)  LDL:87 (12/22/2008), 99 (04/17/2008)  Chol:147 (12/22/2008), 165 (04/17/2008)  Trig:77.0 (12/22/2008), 57 (04/17/2008)  Orders: TLB-Lipid Panel (80061-LIPID) TLB-Hepatic/Liver Function Pnl (80076-HEPATIC)  Problem # 3:  HYPERTENSION (ICD-401.9) Assessment: Unchanged  good control no changes needed  His updated medication list for this problem includes:    Lisinopril-hydrochlorothiazide 20-12.5 Mg Tabs (Lisinopril-hydrochlorothiazide) .Marland Kitchen... Take 2 tablet by mouth once a day    Norvasc 5 Mg Tabs (Amlodipine besylate) .Marland Kitchen... Take 1 tablet by mouth once a day  BP today: 132/78 Prior BP: 120/80 (12/22/2008)  Labs Reviewed: K+: 3.7 (12/22/2008) Creat: : 0.9 (12/22/2008)   Chol: 147 (12/22/2008)   HDL: 45.00 (12/22/2008)   LDL: 87 (12/22/2008)   TG: 77.0 (12/22/2008)  Orders: Venipuncture (40981) TLB-Renal Function Panel (80069-RENAL) TLB-CBC Platelet - w/Differential (85025-CBCD) TLB-TSH (Thyroid Stimulating Hormone) (84443-TSH)  Problem # 4:  SLEEP DISORDER (ICD-780.50) Assessment: Comment Only uses lorazepam as needed   Problem # 5:  BENIGN PROSTATIC HYPERTROPHY (ICD-600.00) Assessment: Comment Only voiding okay  Complete Medication List: 1)  Viagra 100 Mg Tabs (Sildenafil citrate) .... Administer 1/2-1 tablet by mouth once a day 2)  Lisinopril-hydrochlorothiazide 20-12.5 Mg Tabs (Lisinopril-hydrochlorothiazide) .... Take 2 tablet by mouth once a day 3)  Norvasc 5 Mg Tabs (Amlodipine besylate) .... Take 1 tablet by mouth once a day 4)  Pravastatin Sodium 20 Mg Tabs (Pravastatin sodium) .... Take one tablet by mouth once daily 5)  Lorazepam 1 Mg Tabs (Lorazepam) .Marland Kitchen.. 1-2 at bedtime to help sleep 6)  Ketoconazole 2 % Crea (Ketoconazole) .... Apply two times a day to rash till clear  Patient Instructions: 1)  Please schedule a follow-up appointment in 6 months .  2)  Schedule  a colonoscopy/ sigmoidoscopy to help detect colon cancer.   Current Allergies (reviewed today): RIOMET (METFORMIN HCL)

## 2010-05-04 NOTE — Progress Notes (Signed)
Summary: LORAZEPAM   Phone Note Refill Request Message from:  [id number] on July 16, 2009 10:00 AM  Refills Requested: Medication #1:  LORAZEPAM 1 MG  TABS 1-2 at bedtime to help sleep   Last Refilled: 06/08/2009 Form on your desk    Method Requested: Fax to Local Pharmacy Initial call taken by: DeShannon Wilmes CMA Duncan Dull),  July 16, 2009 10:01 AM  Follow-up for Phone Call        okay #60 x 1 Follow-up by: Cindee Salt MD,  July 16, 2009 11:44 AM  Additional Follow-up for Phone Call Additional follow up Details #1::        Rx called to pharmacy Additional Follow-up by: DeShannon Twining CMA Duncan Dull),  July 16, 2009 12:07 PM    Prescriptions: LORAZEPAM 1 MG  TABS (LORAZEPAM) 1-2 at bedtime to help sleep  #60 x 1   Entered by:   Mervin Hack CMA (AAMA)   Authorized by:   Cindee Salt MD   Signed by:   Mervin Hack CMA (AAMA) on 07/16/2009   Method used:   Telephoned to ...       [facility] [phone] (retail)       [address] [place], [place]  [address]       Ph: [phone] or [phone]       Fax: [phone]   RxID:   [id number]

## 2010-05-04 NOTE — Progress Notes (Signed)
Summary: refill request for lorazepam  Phone Note Refill Request   Refills Requested: Medication #1:  LORAZEPAM 1 MG  TABS 1-2 at bedtime to help sleep   Last Refilled: 08/15/2009 [facility] [address] is on your desk.  Initial call taken by: Lowella Petties CMA,  September 10, 2009 9:30 AM  Follow-up for Phone Call        okay #60 x 1 Follow-up by: Cindee Salt MD,  September 10, 2009 1:39 PM  Additional Follow-up for Phone Call Additional follow up Details #1::        Rx faxed to pharmacy Additional Follow-up by: DeShannon Rho CMA Duncan Dull),  September 10, 2009 3:16 PM    Prescriptions: LORAZEPAM 1 MG  TABS (LORAZEPAM) 1-2 at bedtime to help sleep  #60 x 1   Entered by:   Mervin Hack CMA (AAMA)   Authorized by:   Cindee Salt MD   Signed by:   Mervin Hack CMA (AAMA) on 09/10/2009   Method used:   Handwritten   RxID:   [id number]

## 2010-05-04 NOTE — Progress Notes (Signed)
Summary: ativan  Phone Note Refill Request Message from:  Fax from Pharmacy on December 20, 2009 1:28 PM  Refills Requested: Medication #1:  LORAZEPAM 1 MG  TABS 1-2 at bedtime to help sleep   Supply Requested: 1 month   Last Ref[facility]: [facility]10/[phone]   Method Requested: Telephone to Pharmacy Initial call taken by: Benny Lennert CMA Duncan Dull),  December 20, 2009 1:29 PM  Follow-up for Phone Call        okay #60 x 1 Follow-up by: Cindee Salt MD,  December 20, 2009 1:48 PM  Additional Follow-up for Phone Call Additional follow up Details #1::        Rx called to pharmacy Additional Follow-up by: Mervin Hack CMA Duncan Dull),  December 20, 2009 3:26 PM    Prescriptions: LORAZEPAM 1 MG  TABS (LORAZEPAM) 1-2 at bedtime to help sleep  #60 x 1   Entered by:   Mervin Hack CMA (AAMA)   Authorized by:   Cindee Salt MD   Signed by:   Mervin Hack CMA (AAMA) on 12/20/2009   Method used:   Telephoned to ...       [facility] [phone]* (retail)       [address] [place], [place]  [address]       Ph: [phone] or [phone]       Fax: [phone]   RxID:   [id number]

## 2010-05-04 NOTE — Procedures (Signed)
Summary: Colonoscopy  Patient: [name] Note: All result statuses are Final unless otherwise noted.  Tests: (1) Colonoscopy (COL)   COL Colonoscopy           DONE     [facility]     [address]     [place], [place]  [address]           COLONOSCOPY PROCEDURE REPORT           PATIENT:  Rodney Suarez, Rodney Suarez  MR#:  [id number]     BIRTHDATE:  [birth year], 69 yrs. old  GENDER:  male     ENDOSCOPIST:  Wilhemina Bonito. Eda Keys, MD     REF. BY:  Surveillance Program Recall,     PROCEDURE DATE:  03/03/2010     PROCEDURE:  Surveillance Colonoscopy     ASA CLASS:  Class II     INDICATIONS:  history of pre-cancerous (adenomatous) colon polyps,     surveillance and high-risk screening ; index 05-2003 w/ TA     MEDICATIONS:   Fentanyl 75 mcg IV, Versed 8 mg IV           DESCRIPTION OF PROCEDURE:   After the risks benefits and     alternatives of the procedure were thoroughly explained, informed     consent was obtained.  Digital rectal exam was performed and     revealed no abnormalities.   The LB CF-[id number] endoscope     was introduced through the anus and advanced to the cecum, which     was identified by both the appendix and ileocecal valve, without     limitations.Time to cecum = 4:43 min.  The quality of the prep was     good, using MoviPrep.  The instrument was then slowly withdrawn     (time = 11:25 min) as the colon was fully examined.     <<PROCEDUREIMAGES>>           FINDINGS:  A normal appearing cecum, ileocecal valve, and     appendiceal orifice were identified. The ascending, hepatic     flexure, transverse, splenic flexure, descending, sigmoid colon,     and rectum appeared unremarkable.  No polyps or cancers were seen.     Retroflexed views in the rectum revealed internal hemorrhoids.     The scope was then withdrawn from the patient and the procedure     completed.           COMPLICATIONS:  None           ENDOSCOPIC IMPRESSION:     1) Normal colon     2) No  polyps or cancers     3) Internal hemorrhoids           RECOMMENDATIONS:     1) Follow up colonoscopy in 5 years           ______________________________     Wilhemina Bonito. Eda Keys, MD           CC:  Karie Schwalbe, MD; The Patient           n.     eSIGNED:   Wilhemina Bonito. Eda Keys at 03/03/2010 10:52 AM           Elson Areas, [id number]  Note: An exclamation mark (!) indicates a result that was not dispersed into the flowsheet. Document Creation Date: 03/03/2010 10:52 AM _______________________________________________________________________  (1) Order result status: Final Collection or observation date-time: 03/03/2010 10:46 Requested  date-time:  Receipt date-time:  Reported date-time:  Referring Physician:   Ordering Physician: Fransico Setters [phone]) Specimen Source:  Source: Launa Grill Order Number: [id number] Lab site:   Appended Document: Colonoscopy    Clinical Lists Changes  Observations: Added new observation of COLONNXTDUE: 03/2015 (03/03/2010 12:29)

## 2010-05-04 NOTE — Miscellaneous (Signed)
Summary: Orders Update   Clinical Lists Changes  Orders: Added new Referral order of Gastroenterology Referral (GI) - Signed 

## 2010-05-04 NOTE — Letter (Signed)
Summary: Previsit letter  [facility]  [address] [place], [place] [address]   Phone: [phone]  Fax: [phone]       06/30/2009 MRN: [id number]  [facility] [address] [place], [place]  [address]  Dear Rodney Suarez,  Welcome to the Gastroenterology Division at [facility].    You are scheduled to see a nurse for your pre-procedure visit on 08-09-09 at 10:00a.m. on the 3rd floor at [facility], [address]  We ask that you try to arrive at our office 15 minutes prior to your appointment time to allow for check-in.  Your nurse visit will consist of discussing your medical and surgical history, your immediate family medical history, and your medications.    Please bring a complete list of all your medications or, if you prefer, bring the medication bottles and we will list them.  We will need to be aware of both prescribed and over the counter drugs.  We will need to know exact dosage information as well.  If you are on blood thinners (Coumadin, Plavix, Aggrenox, Ticlid, etc.) please call our office today/prior to your appointment, as we need to consult with your physician about holding your medication.   Please be prepared to read and sign documents such as consent forms, a financial agreement, and acknowledgement forms.  If necessary, and with your consent, a friend or relative is welcome to sit-in on the nurse visit with you.  Please bring your insurance card so that we may make a copy of it.  If your insurance requires a referral to see a specialist, please bring your referral form from your primary care physician.  No co-pay is required for this nurse visit.     If you cannot keep your appointment, please call [phone] to cancel or reschedule prior to your appointment date.  This allows [place] the opportunity to schedule an appointment for another patient in need of care.    Thank you for choosing [facility] for your medical needs.  We  appreciate the opportunity to care for you.  Please visit [place] at our website  to learn more about our practice.                     Sincerely.                                                                                                                   The Gastroenterology Division

## 2010-05-05 NOTE — Progress Notes (Signed)
Summary: lorazepam   Phone Note Refill Request Message from:  Fax from Pharmacy on April 29, 2010 10:11 AM  Refills Requested: Medication #1:  LORAZEPAM 1 MG  TABS 1-2 at bedtime to help sleep   Last Refilled: 03/29/2010 [facility] [address] [phone]. Fax is on your desk.   Initial call taken by: Melody Comas,  April 29, 2010 10:11 AM  Follow-up for Phone Call        Okay #60 x 1 Follow-up by: Cindee Salt MD,  April 29, 2010 10:44 AM  Additional Follow-up for Phone Call Additional follow up Details #1::        Rx faxed to pharmacy Additional Follow-up by: DeShannon Schrieber CMA Duncan Dull),  April 29, 2010 11:01 AM    Prescriptions: LORAZEPAM 1 MG  TABS (LORAZEPAM) 1-2 at bedtime to help sleep  #60 x 1   Entered by:   Mervin Hack CMA (AAMA)   Authorized by:   Cindee Salt MD   Signed by:   Mervin Hack CMA (AAMA) on 04/29/2010   Method used:   Handwritten   RxID:   [id number]

## 2010-05-07 ENCOUNTER — Encounter: Payer: Self-pay | Admitting: Internal Medicine

## 2010-06-13 ENCOUNTER — Encounter: Payer: Self-pay | Admitting: Internal Medicine

## 2010-06-21 NOTE — Letter (Signed)
Summary: [facility] Follow [facility]  [facility] Follow [facility]   Imported By: Maryln Gottron 06/17/2010 13:53:17  _____________________________________________________________________  External Attachment:    Type:   Image     Comment:   External Document

## 2010-06-22 ENCOUNTER — Encounter: Payer: Self-pay | Admitting: Internal Medicine

## 2010-07-11 ENCOUNTER — Other Ambulatory Visit: Payer: Self-pay | Admitting: *Deleted

## 2010-07-11 MED ORDER — LORAZEPAM 1 MG PO TABS: ORAL_TABLET | ORAL | Status: DC

## 2010-07-11 NOTE — Telephone Encounter (Signed)
Okay #60 x 0 

## 2010-07-11 NOTE — Telephone Encounter (Signed)
rx faxed over to pharmacy, manually.

## 2010-07-11 NOTE — Telephone Encounter (Signed)
Form on your desk  

## 2010-08-12 ENCOUNTER — Other Ambulatory Visit: Payer: Self-pay | Admitting: *Deleted

## 2010-08-12 MED ORDER — LORAZEPAM 1 MG PO TABS: ORAL_TABLET | ORAL | Status: DC

## 2010-08-12 NOTE — Telephone Encounter (Signed)
rx faxed to pharmacy manually  

## 2010-08-12 NOTE — Telephone Encounter (Signed)
Okay #60 x 1 

## 2010-10-17 ENCOUNTER — Other Ambulatory Visit: Payer: Self-pay | Admitting: *Deleted

## 2010-10-17 MED ORDER — AMLODIPINE BESYLATE 5 MG PO TABS: 5.0000 mg | ORAL_TABLET | Freq: Every day | ORAL | Status: DC

## 2010-10-17 MED ORDER — LORAZEPAM 1 MG PO TABS: ORAL_TABLET | ORAL | Status: DC

## 2010-10-17 NOTE — Telephone Encounter (Signed)
Fax is on your desk . 

## 2010-10-17 NOTE — Telephone Encounter (Signed)
rx faxed to pharmacy manually  

## 2010-10-17 NOTE — Telephone Encounter (Signed)
Okay #60 x 1 

## 2010-12-16 ENCOUNTER — Other Ambulatory Visit: Payer: Self-pay | Admitting: *Deleted

## 2010-12-16 MED ORDER — LORAZEPAM 1 MG PO TABS: ORAL_TABLET | ORAL | Status: DC

## 2010-12-16 NOTE — Telephone Encounter (Signed)
Form on your desk  

## 2010-12-16 NOTE — Telephone Encounter (Signed)
rx faxed to pharmacy manually  

## 2010-12-16 NOTE — Telephone Encounter (Signed)
Okay #60 x 1 

## 2010-12-28 ENCOUNTER — Encounter: Payer: Self-pay | Admitting: Internal Medicine

## 2010-12-28 ENCOUNTER — Ambulatory Visit (INDEPENDENT_AMBULATORY_CARE_PROVIDER_SITE_OTHER): Payer: Medicare Other | Admitting: Internal Medicine

## 2010-12-28 VITALS — BP 167/79 | HR 58 | Temp 97.9°F | Ht 72.0 in | Wt 208.0 lb

## 2010-12-28 DIAGNOSIS — E119 Type 2 diabetes mellitus without complications: Secondary | ICD-10-CM

## 2010-12-28 DIAGNOSIS — Z23 Encounter for immunization: Secondary | ICD-10-CM

## 2010-12-28 DIAGNOSIS — I10 Essential (primary) hypertension: Secondary | ICD-10-CM

## 2010-12-28 DIAGNOSIS — E785 Hyperlipidemia, unspecified: Secondary | ICD-10-CM

## 2010-12-28 DIAGNOSIS — Z0000 Encounter for general adult medical examination without abnormal findings: Secondary | ICD-10-CM | POA: Insufficient documentation

## 2010-12-28 LAB — CBC WITH DIFFERENTIAL/PLATELET
Basophils Relative: 0.5 % (ref 0.0–3.0)
Eosinophils Relative: 3.2 % (ref 0.0–5.0)
Hemoglobin: 14.4 g/dL (ref 13.0–17.0)
Lymphocytes Relative: 39.7 % (ref 12.0–46.0)
MCHC: 33.8 g/dL (ref 30.0–36.0)
Monocytes Relative: 9.1 % (ref 3.0–12.0)
Neutro Abs: 1.8 10*3/uL (ref 1.4–7.7)
Neutrophils Relative %: 47.5 % (ref 43.0–77.0)
RBC: 4.26 Mil/uL (ref 4.22–5.81)
WBC: 3.9 10*3/uL — ABNORMAL LOW (ref 4.5–10.5)

## 2010-12-28 LAB — HEPATIC FUNCTION PANEL
ALT: 28 U/L (ref 0–53)
AST: 33 U/L (ref 0–37)
Alkaline Phosphatase: 73 U/L (ref 39–117)
Bilirubin, Direct: 0.3 mg/dL (ref 0.0–0.3)
Total Bilirubin: 2.1 mg/dL — ABNORMAL HIGH (ref 0.3–1.2)
Total Protein: 7.2 g/dL (ref 6.0–8.3)

## 2010-12-28 LAB — BASIC METABOLIC PANEL
CO2: 29 mEq/L (ref 19–32)
Calcium: 9.1 mg/dL (ref 8.4–10.5)
GFR: 152.57 mL/min (ref >60.00)
Sodium: 141 mEq/L (ref 135–145)

## 2010-12-28 LAB — MICROALBUMIN / CREATININE URINE RATIO
Creatinine,U: 158.9 mg/dL
Microalb Creat Ratio: 0.5 mg/g (ref 0.0–30.0)

## 2010-12-28 LAB — LIPID PANEL
Total CHOL/HDL Ratio: 3
Triglycerides: 64 mg/dL (ref 0.0–149.0)

## 2010-12-28 MED ORDER — AMLODIPINE BESYLATE 10 MG PO TABS: 10.0000 mg | ORAL_TABLET | Freq: Every day | ORAL | Status: DC

## 2010-12-28 NOTE — Assessment & Plan Note (Signed)
Still seems to have good control Will recheck labs 

## 2010-12-28 NOTE — Progress Notes (Signed)
Subjective:    Patient ID: Rodney Suarez, male    DOB: 1940/10/10, 70 y.o.   MRN: [id number]  HPI Doing fairly well No new concerns  Checks sugars occ---about once a week Still running good No hypoglycemic reaction Just had eye exam in July  Still sees Dr Achilles Dunk for cancer PSA went up just a little---not a concern as yet  Current Outpatient Prescriptions on File Prior to Visit  Medication Sig Dispense Refill  . amLODipine (NORVASC) 5 MG tablet Take 1 tablet (5 mg total) by mouth daily.  90 tablet  0  . ketoconazole (NIZORAL) 2 % cream Apply topically 2 (two) times daily as needed.        Marland Kitchen lisinopril-hydrochlorothiazide (PRINZIDE,ZESTORETIC) 20-12.5 MG per tablet Take 2 tablets by mouth daily.        Marland Kitchen LORazepam (ATIVAN) 1 MG tablet Take 1-2 at bedtime to help sleep  60 tablet  1  . pravastatin (PRAVACHOL) 20 MG tablet Take 20 mg by mouth daily.        . sildenafil (VIAGRA) 100 MG tablet Take by mouth daily as needed. 1/2 to 1         Allergies  Allergen Reactions  . Metformin     REACTION: diarrhea    Past Medical History  Diagnosis Date  . Allergy   . Diabetes mellitus   . Hypertension   . Hx of adenomatous colonic polyps   . ED (erectile dysfunction)   . BPH (benign prostatic hypertrophy)   . Hyperlipidemia   . History of prostate cancer   . Osteoarthritis     Past Surgical History  Procedure Date  . Appendectomy 1956  . Foot fracture surgery 1996  . Knee arthroscopy 11/2005    right knee  . Sinus surgery with instatrak 09/2007  . Prostate surgery 01/2009    I-131 implants    No family history on file.  History   Social History  . Marital Status: Married    Spouse Name: N/A    Number of Children: 2  . Years of Education: N/A   Occupational History  . retired     Publishing copy   Social History Main Topics  . Smoking status: Never Smoker   . Smokeless tobacco: Never Used  . Alcohol Use: No  . Drug Use:   . Sexually Active:     Other Topics Concern  . Not on file   Social History Narrative  . No narrative on file   Review of Systems  Constitutional: Negative for fatigue and unexpected weight change.       Weight is down a few pounds Regularly walks and rides bike Wears seat belt  HENT: Negative for hearing loss, congestion, rhinorrhea, dental problem and tinnitus.        Regular with dentist  Eyes: Negative for visual disturbance.       No diplopia or unilateral vision loss   Respiratory: Negative for choking, chest tightness and shortness of breath.   Cardiovascular: Negative for chest pain, palpitations and leg swelling.  Gastrointestinal: Negative for nausea, vomiting, abdominal pain, constipation and blood in stool.       No sig heartburn  Genitourinary: Negative for dysuria, urgency, frequency and difficulty urinating.       Has ED--viagra had helped before prostate procedure  Musculoskeletal: Positive for arthralgias. Negative for back pain and joint swelling.       Knee feels better using the bike more  Skin: Negative for rash.  No suspicious areas   Neurological: Negative for dizziness, syncope, weakness, light-headedness, numbness and headaches.  Hematological: Negative for adenopathy. Does not bruise/bleed easily.  Psychiatric/Behavioral: Positive for sleep disturbance. Negative for dysphoric mood. The patient is not nervous/anxious.        Occ needs lorazepam to help sleep Nerves are better now       Objective:   Physical Exam  Constitutional: He is oriented to person, place, and time. He appears well-developed and well-nourished. No distress.  HENT:  Head: Normocephalic and atraumatic.  Right Ear: External ear normal.  Left Ear: External ear normal.  Mouth/Throat: Oropharynx is clear and moist. No oropharyngeal exudate.       TMs normal  Eyes: Conjunctivae and EOM are normal. Pupils are equal, round, and reactive to light.  Neck: Normal range of motion. Neck supple. No  thyromegaly present.  Cardiovascular: Normal rate, regular rhythm, normal heart sounds and intact distal pulses.  Exam reveals no gallop.   No murmur heard. Pulmonary/Chest: Effort normal and breath sounds normal. No respiratory distress. He has no wheezes. He has no rales.  Abdominal: Soft. There is no tenderness.  Musculoskeletal: Normal range of motion. He exhibits no edema and no tenderness.  Lymphadenopathy:    He has no cervical adenopathy.  Neurological: He is alert and oriented to person, place, and time. He exhibits normal muscle tone.       Normal gait and strength  Skin: Skin is warm. No rash noted.       Multiple benign nevi on back No foot lesions  Psychiatric: He has a normal mood and affect. His behavior is normal. Judgment and thought content normal.          Assessment & Plan:

## 2010-12-28 NOTE — Patient Instructions (Signed)
Please increase the amlodipine to 10mg  daily Check your blood pressure about once a month and write them down for review. Call if they are above 150/90

## 2010-12-28 NOTE — Assessment & Plan Note (Signed)
Doing well Gave Rx for zostavax Follows with urologist for prostate cancer Exercises regularly

## 2010-12-28 NOTE — Assessment & Plan Note (Signed)
BP Readings from Last 3 Encounters:  12/28/10 167/79  12/22/09 160/100  06/21/09 132/78   He thinks he has white coat hypertension Will check urine microal  He will check as outpatient Increase amlodipine

## 2011-02-13 ENCOUNTER — Other Ambulatory Visit: Payer: Self-pay | Admitting: Internal Medicine

## 2011-02-20 ENCOUNTER — Other Ambulatory Visit: Payer: Self-pay | Admitting: *Deleted

## 2011-02-20 NOTE — Telephone Encounter (Signed)
Received faxed refill request, in your IN box.

## 2011-02-21 MED ORDER — LORAZEPAM 1 MG PO TABS: 1.0000 mg | ORAL_TABLET | Freq: Every evening | ORAL | Status: DC | PRN

## 2011-02-21 NOTE — Telephone Encounter (Signed)
rx called into pharmacy

## 2011-02-21 NOTE — Telephone Encounter (Signed)
Okay #60 x 1 

## 2011-05-14 ENCOUNTER — Other Ambulatory Visit: Payer: Self-pay | Admitting: Internal Medicine

## 2011-06-01 ENCOUNTER — Other Ambulatory Visit: Payer: Self-pay | Admitting: *Deleted

## 2011-06-01 NOTE — Telephone Encounter (Signed)
Okay #60 x 1 

## 2011-06-02 MED ORDER — LORAZEPAM 1 MG PO TABS: 1.0000 mg | ORAL_TABLET | Freq: Every evening | ORAL | Status: DC | PRN

## 2011-06-02 NOTE — Telephone Encounter (Signed)
rx called into pharmacy

## 2011-06-21 ENCOUNTER — Telehealth: Payer: Self-pay | Admitting: Internal Medicine

## 2011-06-21 NOTE — Telephone Encounter (Signed)
Triage Record Num: [id number] Operator: Arline Asp Loftin Patient Name: Rodney Suarez Call Date & Time: 06/21/2011 10:35:10AM Patient Phone: [phone] PCP: Tillman Abide Patient Gender: Male PCP Fax : [phone] Patient DOB: 1940-10-15 Practice Name: [facility] Reason for Call: Caller: [name]/Patient; PCP: Tillman Abide I.; CB#: [phone]; Call regarding possible Sinus Infection and Runny Nose, occasional sneezing, onset 03/17. Took Zyrtec on 03/18, mild relief. Afebrile. Emergent sx negative. Home care advice per "Upper Respiratory Infection" due to "new onset of runny nose, sneezing, usually lasting about a week." Recommended daily Zyrtec to see if this is allergy sx. Call back parameters reviewed. Protocol(s) Used: Upper Respiratory Infection (URI) Recommended Outcome per Protocol: Provide Home/Self Care Reason for Outcome: New onset of two or more of the following symptoms: nasal congestion with runny nose; sneezing; itchy or mild sore throat; mild headache or body aches; mild fatigue; low grade fever up to 101.5 F (38.6C) usually lasting about a week Care Advice: ~ Call provider if symptoms worsen or new symptoms develop. Most adults need to drink 6-10 eight-ounce glasses (1.2-2.0 liters) of fluids per day unless previously told to limit fluid intake for other medical reasons. Limit fluids that contain caffeine, sugar or alcohol. Urine will be a very light yellow color when you drink enough fluids. ~ 06/21/2011 11:00:25AM Page 1 of 1 [id number]

## 2011-06-21 NOTE — Telephone Encounter (Signed)
[facility] Triage Call Report Triage Record Num: [id number] Operator: Arline Asp Loftin Patient Name: Rodney Suarez Call Date & Time: 06/21/2011 10:35:10AM Patient Phone: [phone] PCP: Tillman Abide Patient Gender: Male PCP Fax : [phone] Patient DOB: 30-Jun-1940 Practice Name: [facility] Reason for Call: Caller: Antwione/Patient; PCP: Tillman Abide I.; CB#: [phone]; Call regarding possible Sinus Infection and Runny Nose, occasional sneezing, onset 03/17. Took Zyrtec on 03/18, mild relief. Afebrile. Emergent sx negative. Home care advice per "Upper Respiratory Infection" due to "new onset of runny nose, sneezing, usually lasting about a week." Recommended daily Zyrtec to see if this is allergy sx. Call back parameters reviewed. Protocol(s) Used: Upper Respiratory Infection (URI) Recommended Outcome per Protocol: Provide Home/Self Care Reason for Outcome: New onset of two or more of the following symptoms: nasal congestion with runny nose; sneezing; itchy or mild sore throat; mild headache or body aches; mild fatigue; low grade fever up to 101.5 F (38.6C) usually lasting about a week Care Advice: ~ Call provider if symptoms worsen or new symptoms develop. Most adults need to drink 6-10 eight-ounce glasses (1.2-2.0 liters) of fluids per day unless previously told to limit fluid intake for other medical reasons. Limit fluids that contain caffeine, sugar or alcohol. Urine will be a very light yellow color when you drink enough fluids. ~ 03/

## 2011-06-21 NOTE — Telephone Encounter (Signed)
noted 

## 2011-06-22 NOTE — Telephone Encounter (Signed)
See how he is doing Can offer appt if he feels he needs to be seen

## 2011-06-23 NOTE — Telephone Encounter (Signed)
Spoke with patient and he feels better and doesn't need to be seen.

## 2011-06-29 ENCOUNTER — Ambulatory Visit: Payer: Medicare Other | Admitting: Internal Medicine

## 2011-07-07 ENCOUNTER — Ambulatory Visit (INDEPENDENT_AMBULATORY_CARE_PROVIDER_SITE_OTHER): Payer: Medicare Other | Admitting: Internal Medicine

## 2011-07-07 ENCOUNTER — Encounter: Payer: Self-pay | Admitting: Internal Medicine

## 2011-07-07 VITALS — BP 128/80 | HR 76 | Temp 97.7°F | Ht 72.0 in | Wt 201.0 lb

## 2011-07-07 DIAGNOSIS — E119 Type 2 diabetes mellitus without complications: Secondary | ICD-10-CM

## 2011-07-07 DIAGNOSIS — E785 Hyperlipidemia, unspecified: Secondary | ICD-10-CM

## 2011-07-07 DIAGNOSIS — I10 Essential (primary) hypertension: Secondary | ICD-10-CM

## 2011-07-07 DIAGNOSIS — G479 Sleep disorder, unspecified: Secondary | ICD-10-CM

## 2011-07-07 DIAGNOSIS — G478 Other sleep disorders: Secondary | ICD-10-CM

## 2011-07-07 NOTE — Assessment & Plan Note (Signed)
Doing well on the med Lab Results  Component Value Date   LDLCALC 96 12/28/2010

## 2011-07-07 NOTE — Progress Notes (Signed)
Subjective:    Patient ID: Rodney Suarez, male    DOB: 1941/01/24, 71 y.o.   MRN: [id number]  HPI Had some sinus symptoms a couple of weeks ago Did use symptom relief and is better  Didn't get the shingles vaccine Plans to do this though  Checks sugars every other day or so Generally under 110 Just had eye exam  No chest pain No SOB No edema No dizziness or syncope  No stomach troubles No myalgias  Still on statin  Current Outpatient Prescriptions on File Prior to Visit  Medication Sig Dispense Refill  . amLODipine (NORVASC) 10 MG tablet Take 1 tablet (10 mg total) by mouth daily.  90 tablet  3  . ketoconazole (NIZORAL) 2 % cream Apply topically 2 (two) times daily as needed.        Marland Kitchen lisinopril-hydrochlorothiazide (PRINZIDE,ZESTORETIC) 20-12.5 MG per tablet TAKE 2 TABLETS BY MOUTH EVERY DAY  180 tablet  2  . LORazepam (ATIVAN) 1 MG tablet Take 1-2 tablets (1-2 mg total) by mouth at bedtime as needed.  60 tablet  1  . pravastatin (PRAVACHOL) 20 MG tablet TAKE 1 TABLET BY MOUTH EVERY DAY  90 tablet  2  . sildenafil (VIAGRA) 100 MG tablet Take by mouth daily as needed. 1/2 to 1         Allergies  Allergen Reactions  . Metformin     REACTION: diarrhea    Past Medical History  Diagnosis Date  . Allergy   . Diabetes mellitus   . Hypertension   . Hx of adenomatous colonic polyps   . ED (erectile dysfunction)   . BPH (benign prostatic hypertrophy)   . Hyperlipidemia   . History of prostate cancer   . Osteoarthritis     Past Surgical History  Procedure Date  . Appendectomy 1956  . Foot fracture surgery 1996  . Knee arthroscopy 11/2005    right knee  . Sinus surgery with instatrak 09/2007  . Prostate surgery 01/2009    I-131 implants    No family history on file.  History   Social History  . Marital Status: Married    Spouse Name: N/A    Number of Children: 2  . Years of Education: N/A   Occupational History  . retired     Publishing copy    Social History Main Topics  . Smoking status: Never Smoker   . Smokeless tobacco: Never Used  . Alcohol Use: No  . Drug Use:   . Sexually Active:    Other Topics Concern  . Not on file   Social History Narrative  . No narrative on file   Review of Systems Usually sleeps okay----occ uses the lorazepam to help sleep Appetite is fine Weight is down some--has been more active and not eating out as much    Objective:   Physical Exam  Constitutional: He appears well-developed and well-nourished. No distress.  Neck: Normal range of motion. Neck supple. No thyromegaly present.  Cardiovascular: Normal rate, regular rhythm, normal heart sounds and intact distal pulses.  Exam reveals no gallop.   No murmur heard. Pulmonary/Chest: Effort normal and breath sounds normal. No respiratory distress. He has no wheezes. He has no rales.  Musculoskeletal: He exhibits no edema and no tenderness.  Lymphadenopathy:    He has no cervical adenopathy.  Skin:       No foot ulcers or lesions  Psychiatric: He has a normal mood and affect. His behavior is normal.  Assessment & Plan:

## 2011-07-07 NOTE — Assessment & Plan Note (Signed)
BP Readings from Last 3 Encounters:  07/07/11 128/80  12/28/10 167/79  12/22/09 160/100   Much better now No changes needed

## 2011-07-07 NOTE — Assessment & Plan Note (Signed)
Lab Results  Component Value Date   HGBA1C 5.8 12/28/2010   Will recheck  Has lost weight and good lifestyle

## 2011-07-07 NOTE — Assessment & Plan Note (Signed)
Uses the lorazepam occ

## 2011-07-10 ENCOUNTER — Encounter: Payer: Self-pay | Admitting: *Deleted

## 2011-07-10 ENCOUNTER — Ambulatory Visit: Payer: Medicare Other | Admitting: Internal Medicine

## 2011-08-09 ENCOUNTER — Other Ambulatory Visit: Payer: Self-pay | Admitting: *Deleted

## 2011-08-09 MED ORDER — LORAZEPAM 1 MG PO TABS: 1.0000 mg | ORAL_TABLET | Freq: Every evening | ORAL | Status: DC | PRN

## 2011-08-09 NOTE — Telephone Encounter (Signed)
Okay #60 x 1 

## 2011-08-09 NOTE — Telephone Encounter (Signed)
rx called into pharmacy

## 2011-11-15 ENCOUNTER — Other Ambulatory Visit: Payer: Self-pay | Admitting: *Deleted

## 2011-11-15 NOTE — Telephone Encounter (Signed)
Last filled 09/27/2011

## 2011-11-16 MED ORDER — LORAZEPAM 1 MG PO TABS: 1.0000 mg | ORAL_TABLET | Freq: Every evening | ORAL | Status: DC | PRN

## 2011-11-16 NOTE — Telephone Encounter (Signed)
Okay #60 x 0 

## 2011-11-16 NOTE — Telephone Encounter (Signed)
rx called into pharmacy

## 2012-01-10 ENCOUNTER — Other Ambulatory Visit: Payer: Self-pay | Admitting: *Deleted

## 2012-01-10 MED ORDER — LORAZEPAM 1 MG PO TABS: 1.0000 mg | ORAL_TABLET | Freq: Every evening | ORAL | Status: DC | PRN

## 2012-01-10 NOTE — Telephone Encounter (Signed)
Okay #60 x 0 

## 2012-01-10 NOTE — Telephone Encounter (Signed)
rx called into pharmacy

## 2012-02-06 ENCOUNTER — Encounter: Payer: Medicare Other | Admitting: Internal Medicine

## 2012-02-07 ENCOUNTER — Ambulatory Visit: Payer: Medicare Other

## 2012-02-07 ENCOUNTER — Ambulatory Visit (INDEPENDENT_AMBULATORY_CARE_PROVIDER_SITE_OTHER): Payer: Medicare Other

## 2012-02-07 DIAGNOSIS — Z23 Encounter for immunization: Secondary | ICD-10-CM

## 2012-02-22 ENCOUNTER — Other Ambulatory Visit: Payer: Self-pay | Admitting: *Deleted

## 2012-02-22 MED ORDER — LORAZEPAM 1 MG PO TABS: 1.0000 mg | ORAL_TABLET | Freq: Every evening | ORAL | Status: DC | PRN

## 2012-02-22 NOTE — Telephone Encounter (Signed)
Okay #60 x 0 

## 2012-02-22 NOTE — Telephone Encounter (Signed)
rx called into pharmacy

## 2012-04-01 ENCOUNTER — Encounter: Payer: Self-pay | Admitting: Internal Medicine

## 2012-04-01 ENCOUNTER — Ambulatory Visit (INDEPENDENT_AMBULATORY_CARE_PROVIDER_SITE_OTHER): Payer: Medicare Other | Admitting: Internal Medicine

## 2012-04-01 VITALS — BP 130/82 | HR 82 | Temp 98.7°F | Ht 72.0 in | Wt 196.5 lb

## 2012-04-01 DIAGNOSIS — E119 Type 2 diabetes mellitus without complications: Secondary | ICD-10-CM

## 2012-04-01 DIAGNOSIS — I10 Essential (primary) hypertension: Secondary | ICD-10-CM

## 2012-04-01 DIAGNOSIS — Z8546 Personal history of malignant neoplasm of prostate: Secondary | ICD-10-CM

## 2012-04-01 DIAGNOSIS — E785 Hyperlipidemia, unspecified: Secondary | ICD-10-CM

## 2012-04-01 DIAGNOSIS — R110 Nausea: Secondary | ICD-10-CM

## 2012-04-01 LAB — BASIC METABOLIC PANEL
BUN: 15 mg/dL (ref 6–23)
CO2: 30 mEq/L (ref 19–32)
Calcium: 8.9 mg/dL (ref 8.4–10.5)
Chloride: 101 mEq/L (ref 96–112)
GFR: 96.34 mL/min (ref >60.00)
Potassium: 3.5 mEq/L (ref 3.5–5.1)

## 2012-04-01 LAB — LIPID PANEL
Cholesterol: 190 mg/dL (ref 0–200)
LDL Cholesterol: 104 mg/dL — ABNORMAL HIGH (ref 0–99)

## 2012-04-01 LAB — HEPATIC FUNCTION PANEL
Alkaline Phosphatase: 214 U/L — ABNORMAL HIGH (ref 39–117)
Bilirubin, Direct: 0.9 mg/dL — ABNORMAL HIGH (ref 0.0–0.3)
Total Bilirubin: 3.6 mg/dL — ABNORMAL HIGH (ref 0.3–1.2)
Total Protein: 7.4 g/dL (ref 6.0–8.3)

## 2012-04-01 LAB — TSH: TSH: 1.21 u[IU]/mL (ref 0.35–5.50)

## 2012-04-01 NOTE — Assessment & Plan Note (Signed)
Seems to be from intolerance to the sausage that he ate No signs of infection Discussed just waiting this out No rx indicated

## 2012-04-01 NOTE — Assessment & Plan Note (Signed)
BP Readings from Last 3 Encounters:  04/01/12 130/82  07/07/11 128/80  12/28/10 167/79   Good control since he lost weight No changes needed

## 2012-04-01 NOTE — Assessment & Plan Note (Signed)
Seems to still be well controlled Will check labs 

## 2012-04-01 NOTE — Progress Notes (Signed)
Subjective:    Patient ID: Rodney Suarez, male    DOB: Jul 30, 1940, 71 y.o.   MRN: [id number]  HPI Had been feeling good Has been delivering orders for [facility] Add little beef sausages all day since he was busy Started getting anorexia and nausea since then (2 days ago) Better today but still doesn't feel good No abdominal pain No diarrhea ---had normal stool 2 nights ago. No blood May have had low grade fever  Checks sugars every couple of days Running good--very careful. Still watching diet  No chest pain No SOB  Current Outpatient Prescriptions on File Prior to Visit  Medication Sig Dispense Refill  . ketoconazole (NIZORAL) 2 % cream Apply topically 2 (two) times daily as needed.        Marland Kitchen LORazepam (ATIVAN) 1 MG tablet Take 1-2 tablets (1-2 mg total) by mouth at bedtime as needed.  60 tablet  0  . pravastatin (PRAVACHOL) 20 MG tablet TAKE 1 TABLET BY MOUTH EVERY DAY  90 tablet  2  . amLODipine (NORVASC) 10 MG tablet Take 1 tablet (10 mg total) by mouth daily.  90 tablet  3  . lisinopril-hydrochlorothiazide (PRINZIDE,ZESTORETIC) 20-12.5 MG per tablet TAKE 2 TABLETS BY MOUTH EVERY DAY  180 tablet  2  . sildenafil (VIAGRA) 100 MG tablet Take by mouth daily as needed. 1/2 to 1         Allergies  Allergen Reactions  . Metformin     REACTION: diarrhea    Past Medical History  Diagnosis Date  . Allergy   . Diabetes mellitus   . Hypertension   . Hx of adenomatous colonic polyps   . ED (erectile dysfunction)   . BPH (benign prostatic hypertrophy)   . Hyperlipidemia   . History of prostate cancer   . Osteoarthritis     Past Surgical History  Procedure Date  . Appendectomy 1956  . Foot fracture surgery 1996  . Knee arthroscopy 11/2005    right knee  . Sinus surgery with instatrak 09/2007  . Prostate surgery 01/2009    I-131 implants    No family history on file.  History   Social History  . Marital Status: Married    Spouse Name: N/A    Number of  Children: 2  . Years of Education: N/A   Occupational History  . retired     Publishing copy   Social History Main Topics  . Smoking status: Former Smoker    Quit date: 04/03/1982  . Smokeless tobacco: Never Used  . Alcohol Use: No  . Drug Use: Not on file  . Sexually Active: Not on file   Other Topics Concern  . Not on file   Social History Narrative  . No narrative on file   Review of Systems Urine bright yellow No dysuria No cough or SOB    Objective:   Physical Exam  Constitutional: He appears well-developed and well-nourished. No distress.  Neck: Normal range of motion. Neck supple. No thyromegaly present.  Cardiovascular: Normal rate, regular rhythm and normal heart sounds.  Exam reveals no gallop.   No murmur heard. Pulmonary/Chest: Effort normal and breath sounds normal. No respiratory distress. He has no wheezes. He has no rales.  Abdominal: Soft. Bowel sounds are normal. He exhibits no distension and no mass. There is no tenderness. There is no rebound and no guarding.  Musculoskeletal: He exhibits no edema and no tenderness.  Lymphadenopathy:    He has no cervical adenopathy.  Psychiatric: He has a normal mood and affect. His behavior is normal.          Assessment & Plan:

## 2012-04-02 LAB — CBC WITH DIFFERENTIAL/PLATELET
Basophils Absolute: 0 10*3/uL (ref 0.0–0.1)
Eosinophils Absolute: 0.2 10*3/uL (ref 0.0–0.7)
Hemoglobin: 15 g/dL (ref 13.0–17.0)
Lymphocytes Relative: 24.4 % (ref 12.0–46.0)
Monocytes Relative: 10.1 % (ref 3.0–12.0)
Neutro Abs: 2 10*3/uL (ref 1.4–7.7)
Neutrophils Relative %: 57.7 % (ref 43.0–77.0)
RDW: 14.7 % — ABNORMAL HIGH (ref 11.5–14.6)

## 2012-04-09 ENCOUNTER — Other Ambulatory Visit: Payer: Self-pay | Admitting: *Deleted

## 2012-04-09 MED ORDER — LORAZEPAM 1 MG PO TABS: 1.0000 mg | ORAL_TABLET | Freq: Every evening | ORAL | Status: DC | PRN

## 2012-04-09 NOTE — Telephone Encounter (Signed)
rx called into pharmacy

## 2012-04-09 NOTE — Telephone Encounter (Signed)
Okay #60 x 0 

## 2012-04-24 ENCOUNTER — Other Ambulatory Visit (INDEPENDENT_AMBULATORY_CARE_PROVIDER_SITE_OTHER): Payer: Medicare Other

## 2012-04-24 DIAGNOSIS — R7989 Other specified abnormal findings of blood chemistry: Secondary | ICD-10-CM

## 2012-04-24 LAB — HEPATIC FUNCTION PANEL
ALT: 22 U/L (ref 0–53)
AST: 24 U/L (ref 0–37)
Alkaline Phosphatase: 99 U/L (ref 39–117)
Bilirubin, Direct: 0.1 mg/dL (ref 0.0–0.3)
Total Bilirubin: 1.1 mg/dL (ref 0.3–1.2)
Total Protein: 7.5 g/dL (ref 6.0–8.3)

## 2012-06-03 ENCOUNTER — Other Ambulatory Visit: Payer: Self-pay | Admitting: *Deleted

## 2012-06-03 MED ORDER — LORAZEPAM 1 MG PO TABS: 1.0000 mg | ORAL_TABLET | Freq: Every evening | ORAL | Status: DC | PRN

## 2012-06-03 NOTE — Telephone Encounter (Signed)
rx called into pharmacy

## 2012-06-03 NOTE — Telephone Encounter (Signed)
Last filled 04/09/2012

## 2012-06-03 NOTE — Telephone Encounter (Signed)
Okay #60 x 0 

## 2012-06-08 ENCOUNTER — Emergency Department: Payer: Self-pay | Admitting: Emergency Medicine

## 2012-06-25 ENCOUNTER — Other Ambulatory Visit: Payer: Self-pay | Admitting: Internal Medicine

## 2012-07-10 ENCOUNTER — Other Ambulatory Visit: Payer: Self-pay | Admitting: Internal Medicine

## 2012-07-11 ENCOUNTER — Other Ambulatory Visit: Payer: Self-pay | Admitting: *Deleted

## 2012-07-11 MED ORDER — LORAZEPAM 1 MG PO TABS: 1.0000 mg | ORAL_TABLET | Freq: Every evening | ORAL | Status: DC | PRN

## 2012-07-11 NOTE — Telephone Encounter (Signed)
Okay #60 x 0 

## 2012-07-11 NOTE — Telephone Encounter (Signed)
Last filled 06/03/12

## 2012-07-11 NOTE — Telephone Encounter (Signed)
rx called into pharmacy

## 2012-08-09 ENCOUNTER — Other Ambulatory Visit: Payer: Self-pay | Admitting: Internal Medicine

## 2012-08-09 NOTE — Telephone Encounter (Signed)
rx called into pharmacy

## 2012-08-09 NOTE — Telephone Encounter (Signed)
Okay #60 x 0 

## 2012-08-19 ENCOUNTER — Encounter: Payer: Medicare Other | Admitting: Internal Medicine

## 2012-09-13 ENCOUNTER — Other Ambulatory Visit: Payer: Self-pay | Admitting: *Deleted

## 2012-09-13 MED ORDER — LORAZEPAM 1 MG PO TABS: ORAL_TABLET | ORAL | Status: DC

## 2012-09-13 NOTE — Telephone Encounter (Signed)
Faxed refill request from [facility] [address] for lorazepam, last filled 08/09/12.  Dr Alphonsus Sias is out of the office.

## 2012-09-17 MED ORDER — LORAZEPAM 1 MG PO TABS: ORAL_TABLET | ORAL | Status: DC

## 2012-09-17 NOTE — Telephone Encounter (Signed)
Rx called to pharmacy as instructed. 

## 2012-09-17 NOTE — Telephone Encounter (Signed)
Please call in

## 2012-10-11 ENCOUNTER — Encounter: Payer: Self-pay | Admitting: *Deleted

## 2012-10-14 ENCOUNTER — Ambulatory Visit (INDEPENDENT_AMBULATORY_CARE_PROVIDER_SITE_OTHER): Payer: Medicare Other | Admitting: Internal Medicine

## 2012-10-14 ENCOUNTER — Encounter: Payer: Self-pay | Admitting: Internal Medicine

## 2012-10-14 VITALS — BP 140/80 | HR 67 | Temp 98.6°F | Wt 196.0 lb

## 2012-10-14 DIAGNOSIS — Z0000 Encounter for general adult medical examination without abnormal findings: Secondary | ICD-10-CM

## 2012-10-14 DIAGNOSIS — I10 Essential (primary) hypertension: Secondary | ICD-10-CM

## 2012-10-14 DIAGNOSIS — E119 Type 2 diabetes mellitus without complications: Secondary | ICD-10-CM

## 2012-10-14 DIAGNOSIS — G479 Sleep disorder, unspecified: Secondary | ICD-10-CM

## 2012-10-14 DIAGNOSIS — E785 Hyperlipidemia, unspecified: Secondary | ICD-10-CM

## 2012-10-14 LAB — HEPATIC FUNCTION PANEL
Alkaline Phosphatase: 71 U/L (ref 39–117)
Bilirubin, Direct: 0.2 mg/dL (ref 0.0–0.3)
Total Bilirubin: 2.2 mg/dL — ABNORMAL HIGH (ref 0.3–1.2)
Total Protein: 7.5 g/dL (ref 6.0–8.3)

## 2012-10-14 LAB — CBC WITH DIFFERENTIAL/PLATELET
Eosinophils Relative: 2.9 % (ref 0.0–5.0)
HCT: 44.6 % (ref 39.0–52.0)
Hemoglobin: 15.1 g/dL (ref 13.0–17.0)
Lymphocytes Relative: 40.7 % (ref 12.0–46.0)
Lymphs Abs: 1.5 10*3/uL (ref 0.7–4.0)
Monocytes Relative: 8.3 % (ref 3.0–12.0)
Platelets: 190 10*3/uL (ref 150.0–400.0)
WBC: 3.7 10*3/uL — ABNORMAL LOW (ref 4.5–10.5)

## 2012-10-14 LAB — BASIC METABOLIC PANEL
BUN: 15 mg/dL (ref 6–23)
GFR: 99.71 mL/min (ref >60.00)
Potassium: 4 mEq/L (ref 3.5–5.1)
Sodium: 138 mEq/L (ref 135–145)

## 2012-10-14 LAB — LIPID PANEL
LDL Cholesterol: 123 mg/dL — ABNORMAL HIGH (ref 0–99)
VLDL: 10.2 mg/dL (ref 0.0–40.0)

## 2012-10-14 LAB — TSH: TSH: 1.38 u[IU]/mL (ref 0.35–5.50)

## 2012-10-14 NOTE — Assessment & Plan Note (Signed)
Seems to be well controlled.

## 2012-10-14 NOTE — Assessment & Plan Note (Signed)
Doing well UTD on cancer screening and imms Taking good care of himself

## 2012-10-14 NOTE — Patient Instructions (Signed)
Please set up your diabetic eye exam. 

## 2012-10-14 NOTE — Assessment & Plan Note (Signed)
Uses the lorazepam fairly regularly

## 2012-10-14 NOTE — Assessment & Plan Note (Signed)
BP Readings from Last 3 Encounters:  10/14/12 140/80  04/01/12 130/82  07/07/11 128/80   Good control No changes needed

## 2012-10-14 NOTE — Progress Notes (Signed)
Subjective:    Patient ID: Rodney Suarez, male    DOB: 11-30-1940, 72 y.o.   MRN: [id number]  HPI Here for physical Reviewed advanced directives Reviewed form   checks sugars twice a week or so Usually under 110 No hypoglycemic Due for eye exam  Exercises regularly Rides bike, walks  Current Outpatient Prescriptions on File Prior to Visit  Medication Sig Dispense Refill  . amLODipine (NORVASC) 10 MG tablet TAKE 1 TABLET (10 MG TOTAL) BY MOUTH DAILY.  90 tablet  2  . aspirin 325 MG tablet Take 325 mg by mouth daily.      Marland Kitchen lisinopril-hydrochlorothiazide (PRINZIDE,ZESTORETIC) 20-12.5 MG per tablet TAKE 2 TABLETS BY MOUTH EVERY DAY  180 tablet  2  . LORazepam (ATIVAN) 1 MG tablet TAKE 1 TO 2 TABLETS AT BEDTIME AS NEEDED  60 tablet  0  . pravastatin (PRAVACHOL) 20 MG tablet TAKE 1 TABLET BY MOUTH EVERY DAY  90 tablet  2   No current facility-administered medications on file prior to visit.    Allergies  Allergen Reactions  . Metformin     REACTION: diarrhea    Past Medical History  Diagnosis Date  . Allergy   . Diabetes mellitus   . Hypertension   . Hx of adenomatous colonic polyps   . ED (erectile dysfunction)   . BPH (benign prostatic hypertrophy)   . Hyperlipidemia   . History of prostate cancer   . Osteoarthritis     Past Surgical History  Procedure Laterality Date  . Appendectomy  1956  . Foot fracture surgery  1996  . Knee arthroscopy  11/2005    right knee  . Sinus surgery with instatrak  09/2007  . Prostate surgery  01/2009    I-131 implants    No family history on file.  History   Social History  . Marital Status: Married    Spouse Name: N/A    Number of Children: 2  . Years of Education: N/A   Occupational History  . retired     Publishing copy   Social History Main Topics  . Smoking status: Former Smoker    Quit date: 04/03/1982  . Smokeless tobacco: Never Used  . Alcohol Use: No  . Drug Use: Not on file  . Sexually Active:  Not on file   Other Topics Concern  . Not on file   Social History Narrative   No living will   Wife and daughter should be health care POAs   Discussed DNR--he requests this (done 10/14/12)   No tube feedings if cognitively unaware   Review of Systems  Constitutional: Negative for fatigue and unexpected weight change.       Wears seat belt  HENT: Negative for hearing loss, congestion, rhinorrhea, dental problem and tinnitus.        Overdue for dentist  Eyes: Negative for visual disturbance.       No diplopia or unilateral vision loss  Respiratory: Negative for cough, chest tightness and shortness of breath.   Cardiovascular: Negative for chest pain, palpitations and leg swelling.  Gastrointestinal: Negative for nausea, vomiting, abdominal pain, constipation and blood in stool.       No heartburn  Endocrine: Negative for cold intolerance and heat intolerance.  Genitourinary: Negative for urgency, frequency and difficulty urinating.       No sex --no problem  Musculoskeletal: Negative for back pain, joint swelling and arthralgias.  Skin: Negative for rash.       No  suspicious lesions  Allergic/Immunologic: Negative for environmental allergies and immunocompromised state.  Neurological: Negative for dizziness, syncope, weakness, light-headedness, numbness and headaches.  Psychiatric/Behavioral: Positive for sleep disturbance. Negative for dysphoric mood. The patient is not nervous/anxious.        Does need the lorazepam regularly to help sleep       Objective:   Physical Exam  Constitutional: He is oriented to person, place, and time. He appears well-developed and well-nourished. No distress.  HENT:  Head: Normocephalic and atraumatic.  Right Ear: External ear normal.  Left Ear: External ear normal.  Mouth/Throat: Oropharynx is clear and moist. No oropharyngeal exudate.  Eyes: Conjunctivae and EOM are normal. Pupils are equal, round, and reactive to light.  Neck: Normal range  of motion. Neck supple. No thyromegaly present.  Cardiovascular: Normal rate, regular rhythm, normal heart sounds and intact distal pulses.  Exam reveals no gallop.   No murmur heard. Pulmonary/Chest: Effort normal and breath sounds normal. No respiratory distress. He has no wheezes. He has no rales.  Abdominal: Soft. There is no tenderness.  Musculoskeletal: He exhibits no edema and no tenderness.  Lymphadenopathy:    He has no cervical adenopathy.  Neurological: He is alert and oriented to person, place, and time.  Skin: No rash noted. No erythema.  Psychiatric: He has a normal mood and affect. His behavior is normal.          Assessment & Plan:

## 2012-10-14 NOTE — Assessment & Plan Note (Signed)
Will check labs Increase pravastatin if LDL still over 100

## 2012-10-16 ENCOUNTER — Encounter: Payer: Self-pay | Admitting: *Deleted

## 2012-10-22 ENCOUNTER — Other Ambulatory Visit: Payer: Self-pay | Admitting: Family Medicine

## 2012-10-22 NOTE — Telephone Encounter (Signed)
Electronic refill request.  Please advise. 

## 2012-10-23 NOTE — Telephone Encounter (Signed)
rx called into pharmacy

## 2012-10-23 NOTE — Telephone Encounter (Signed)
Okay #60 x 0 

## 2012-10-25 ENCOUNTER — Encounter: Payer: Self-pay | Admitting: Internal Medicine

## 2012-10-29 ENCOUNTER — Other Ambulatory Visit: Payer: Self-pay | Admitting: Internal Medicine

## 2012-12-06 ENCOUNTER — Encounter: Payer: Medicare Other | Admitting: Internal Medicine

## 2012-12-09 ENCOUNTER — Other Ambulatory Visit: Payer: Self-pay | Admitting: *Deleted

## 2012-12-09 NOTE — Telephone Encounter (Signed)
Last filled 10/23/12 

## 2012-12-10 MED ORDER — LORAZEPAM 1 MG PO TABS: ORAL_TABLET | ORAL | Status: DC

## 2012-12-10 NOTE — Telephone Encounter (Signed)
rx called into pharmacy

## 2012-12-10 NOTE — Telephone Encounter (Signed)
Okay #60 x 0 

## 2012-12-20 ENCOUNTER — Encounter: Payer: Self-pay | Admitting: Internal Medicine

## 2013-01-13 ENCOUNTER — Other Ambulatory Visit: Payer: Self-pay | Admitting: *Deleted

## 2013-01-13 NOTE — Telephone Encounter (Signed)
Last filled 12/10/12 

## 2013-01-14 MED ORDER — LORAZEPAM 1 MG PO TABS: ORAL_TABLET | ORAL | Status: DC

## 2013-01-14 NOTE — Telephone Encounter (Signed)
Please call in

## 2013-01-14 NOTE — Telephone Encounter (Signed)
rx called into pharmacy

## 2013-01-23 ENCOUNTER — Ambulatory Visit (INDEPENDENT_AMBULATORY_CARE_PROVIDER_SITE_OTHER): Payer: Medicare Other

## 2013-01-23 DIAGNOSIS — Z23 Encounter for immunization: Secondary | ICD-10-CM

## 2013-02-18 ENCOUNTER — Telehealth: Payer: Self-pay | Admitting: Internal Medicine

## 2013-02-18 NOTE — Telephone Encounter (Signed)
Does Mr. Rodney Suarez need to come in for futher labs work? Pt stated on phone this morning that his wife spoke to someone yesterday about coming in for labs. Pt stated his cholesterol was high the last time he was seen and maybe needed to be rechecked.

## 2013-02-18 NOTE — Telephone Encounter (Signed)
Please disregard previous message. Spoke to [employer] D and got the answer!  Thanks

## 2013-03-02 ENCOUNTER — Other Ambulatory Visit: Payer: Self-pay | Admitting: Family Medicine

## 2013-03-03 NOTE — Telephone Encounter (Signed)
Last filled 01/13/13

## 2013-03-04 NOTE — Telephone Encounter (Signed)
Okay #60 x 0 

## 2013-03-04 NOTE — Telephone Encounter (Signed)
Rx called to pharmacy as instructed. 

## 2013-04-07 ENCOUNTER — Other Ambulatory Visit: Payer: Self-pay | Admitting: *Deleted

## 2013-04-07 MED ORDER — LORAZEPAM 1 MG PO TABS: 1.0000 mg | ORAL_TABLET | Freq: Every evening | ORAL | Status: DC | PRN

## 2013-04-07 NOTE — Telephone Encounter (Signed)
rx called into pharmacy

## 2013-04-07 NOTE — Telephone Encounter (Signed)
Ok to refill 

## 2013-04-07 NOTE — Telephone Encounter (Signed)
Okay #60 x 0 

## 2013-05-05 ENCOUNTER — Other Ambulatory Visit: Payer: Self-pay | Admitting: *Deleted

## 2013-05-05 MED ORDER — LORAZEPAM 1 MG PO TABS: 1.0000 mg | ORAL_TABLET | Freq: Every evening | ORAL | Status: DC | PRN

## 2013-05-05 NOTE — Telephone Encounter (Signed)
Last filled 04/07/13

## 2013-05-05 NOTE — Telephone Encounter (Signed)
plz phone in. 

## 2013-05-06 NOTE — Telephone Encounter (Signed)
rx called into pharmacy

## 2013-06-12 ENCOUNTER — Other Ambulatory Visit: Payer: Self-pay | Admitting: Family Medicine

## 2013-06-12 NOTE — Telephone Encounter (Signed)
Medication phoned to pharmacy.  

## 2013-06-12 NOTE — Telephone Encounter (Signed)
Ok to refill in Dr. Letvak's absence? 

## 2013-06-12 NOTE — Telephone Encounter (Signed)
Please call in

## 2013-07-15 ENCOUNTER — Other Ambulatory Visit: Payer: Self-pay | Admitting: Family Medicine

## 2013-07-15 NOTE — Telephone Encounter (Signed)
Electronic refill request. Last filled:  60 tablets 0RF on 06/12/2013  Please advise.

## 2013-07-16 NOTE — Telephone Encounter (Signed)
Please call in.  Thanks.   

## 2013-07-16 NOTE — Telephone Encounter (Signed)
rx called into pharmacy

## 2013-07-22 ENCOUNTER — Telehealth: Payer: Self-pay

## 2013-07-22 MED ORDER — KETOCONAZOLE 2 % EX CREA: 1.0000 "application " | TOPICAL_CREAM | Freq: Two times a day (BID) | CUTANEOUS | Status: DC | PRN

## 2013-07-22 NOTE — Telephone Encounter (Signed)
Pt has red irritated area in right groin with itching which started 2 days ago. Pt said years ago Dr Silvio Pate prescribed med for jock itch but pt cannot remember name of med. Pt only wants to see Dr Silvio Pate; no available appts for few days with Dr Silvio Pate; pt ask if Dr Silvio Pate will prescribe med for jock itch to [place]. Pt has tried OTC cortisone but has not worked.pt request cb.

## 2013-07-22 NOTE — Telephone Encounter (Signed)
rx sent to pharmacy by e-script  

## 2013-07-22 NOTE — Telephone Encounter (Signed)
Okay to send Rx for ketoconazole 2% cream Bid to rash prn #60 gm x 1

## 2013-07-31 LAB — HM DIABETES EYE EXAM

## 2013-08-18 ENCOUNTER — Other Ambulatory Visit: Payer: Self-pay | Admitting: Family Medicine

## 2013-08-18 NOTE — Telephone Encounter (Signed)
Received refill request electronically from pharmacy. Last refill 07/16/13 #60, last office visit 10/14/12. Is it okay to refill medication?

## 2013-08-18 NOTE — Telephone Encounter (Signed)
rx called into pharmacy

## 2013-08-18 NOTE — Telephone Encounter (Signed)
Okay #60 x 0 

## 2013-09-02 ENCOUNTER — Other Ambulatory Visit: Payer: Self-pay | Admitting: Internal Medicine

## 2013-09-24 ENCOUNTER — Other Ambulatory Visit: Payer: Self-pay | Admitting: Internal Medicine

## 2013-09-24 NOTE — Telephone Encounter (Signed)
08/18/13 

## 2013-09-24 NOTE — Telephone Encounter (Signed)
rx called into pharmacy

## 2013-09-24 NOTE — Telephone Encounter (Signed)
Okay #60 x 0 Have him set up Medicare wellness in October or November

## 2013-10-02 ENCOUNTER — Other Ambulatory Visit: Payer: Self-pay | Admitting: Internal Medicine

## 2013-10-13 ENCOUNTER — Telehealth: Payer: Self-pay | Admitting: *Deleted

## 2013-10-13 NOTE — Telephone Encounter (Signed)
Lm on pts vm requesting a call back to schedule DIABETIC BUNDLE OV-needs BP check and LDL 

## 2013-10-15 ENCOUNTER — Ambulatory Visit: Payer: Medicare Other | Admitting: Internal Medicine

## 2013-10-22 ENCOUNTER — Ambulatory Visit (INDEPENDENT_AMBULATORY_CARE_PROVIDER_SITE_OTHER): Payer: Medicare Other | Admitting: Internal Medicine

## 2013-10-22 ENCOUNTER — Encounter: Payer: Self-pay | Admitting: Internal Medicine

## 2013-10-22 ENCOUNTER — Encounter: Payer: Self-pay | Admitting: Radiology

## 2013-10-22 VITALS — BP 122/80 | HR 68 | Temp 98.4°F | Wt 215.0 lb

## 2013-10-22 DIAGNOSIS — G479 Sleep disorder, unspecified: Secondary | ICD-10-CM

## 2013-10-22 DIAGNOSIS — E119 Type 2 diabetes mellitus without complications: Secondary | ICD-10-CM

## 2013-10-22 DIAGNOSIS — I10 Essential (primary) hypertension: Secondary | ICD-10-CM

## 2013-10-22 DIAGNOSIS — E785 Hyperlipidemia, unspecified: Secondary | ICD-10-CM

## 2013-10-22 LAB — COMPREHENSIVE METABOLIC PANEL
ALBUMIN: 4.3 g/dL (ref 3.5–5.2)
ALK PHOS: 64 U/L (ref 39–117)
ALT: 28 U/L (ref 0–53)
AST: 31 U/L (ref 0–37)
BUN: 17 mg/dL (ref 6–23)
CHLORIDE: 102 meq/L (ref 96–112)
CO2: 30 mEq/L (ref 19–32)
Calcium: 9.1 mg/dL (ref 8.4–10.5)
Creatinine, Ser: 1 mg/dL (ref 0.4–1.5)
GFR: 89.57 mL/min (ref >60.00)
GLUCOSE: 111 mg/dL — AB (ref 70–99)
Potassium: 3.7 mEq/L (ref 3.5–5.1)
Sodium: 138 mEq/L (ref 135–145)
Total Bilirubin: 2 mg/dL — ABNORMAL HIGH (ref 0.2–1.2)
Total Protein: 7.4 g/dL (ref 6.0–8.3)

## 2013-10-22 LAB — LIPID PANEL
CHOLESTEROL: 162 mg/dL (ref 0–200)
HDL: 53.8 mg/dL (ref >39.00)
LDL Cholesterol: 94 mg/dL (ref 0–99)
NONHDL: 108.2
Total CHOL/HDL Ratio: 3
Triglycerides: 71 mg/dL (ref 0.0–149.0)
VLDL: 14.2 mg/dL (ref 0.0–40.0)

## 2013-10-22 LAB — CBC WITH DIFFERENTIAL/PLATELET
Basophils Absolute: 0 10*3/uL (ref 0.0–0.1)
Basophils Relative: 0.5 % (ref 0.0–3.0)
EOS ABS: 0.1 10*3/uL (ref 0.0–0.7)
Eosinophils Relative: 3.3 % (ref 0.0–5.0)
HEMATOCRIT: 42 % (ref 39.0–52.0)
Hemoglobin: 14.1 g/dL (ref 13.0–17.0)
LYMPHS ABS: 1.8 10*3/uL (ref 0.7–4.0)
Lymphocytes Relative: 39.4 % (ref 12.0–46.0)
MCHC: 33.6 g/dL (ref 30.0–36.0)
MCV: 99.2 fl (ref 78.0–100.0)
MONO ABS: 0.4 10*3/uL (ref 0.1–1.0)
Monocytes Relative: 9.6 % (ref 3.0–12.0)
NEUTROS PCT: 47.2 % (ref 43.0–77.0)
Neutro Abs: 2.1 10*3/uL (ref 1.4–7.7)
PLATELETS: 190 10*3/uL (ref 150.0–400.0)
RBC: 4.23 Mil/uL (ref 4.22–5.81)
RDW: 14.5 % (ref 11.5–15.5)
WBC: 4.5 10*3/uL (ref 4.0–10.5)

## 2013-10-22 LAB — HEMOGLOBIN A1C: HEMOGLOBIN A1C: 6.2 % (ref 4.6–6.5)

## 2013-10-22 LAB — HM DIABETES FOOT EXAM

## 2013-10-22 LAB — T4, FREE: Free T4: 0.97 ng/dL (ref 0.60–1.60)

## 2013-10-22 NOTE — Assessment & Plan Note (Signed)
Still seems to have excellent control Will check labs

## 2013-10-22 NOTE — Assessment & Plan Note (Signed)
Uses the lorazepam intermittently

## 2013-10-22 NOTE — Assessment & Plan Note (Signed)
Will recheck labs now Had been off the med by mistake last year when LDL was up some

## 2013-10-22 NOTE — Assessment & Plan Note (Signed)
BP Readings from Last 3 Encounters:  10/22/13 122/80  10/14/12 140/80  04/01/12 130/82   Good control No changes needed

## 2013-10-22 NOTE — Progress Notes (Signed)
Subjective:    Patient ID: Rodney Suarez, male    DOB: September 29, 1940, 73 y.o.   MRN: [id number]  HPI Doing well No new problems  Checks sugars every day or 2 Usually under 110 fasting Trying to be careful with diet Eye exam ~2 months ago No sores, numbness or pain in feet  Still has some sleep issues Uses the lorazepam when needed---several times per week (twice in past week?) No depression or anxiety  No chest pain No SOB No palpitations No dizziness or syncope No edema  No myalgias or GI problems on statin Discussed reducing asa dose to 81  Current Outpatient Prescriptions on File Prior to Visit  Medication Sig Dispense Refill  . amLODipine (NORVASC) 10 MG tablet TAKE 1 TABLET EVERY DAY  90 tablet  2  . ketoconazole (NIZORAL) 2 % cream Apply 1 application topically 2 (two) times daily as needed for irritation.  60 g  1  . lisinopril-hydrochlorothiazide (PRINZIDE,ZESTORETIC) 20-12.5 MG per tablet TAKE 2 TABLETS BY MOUTH EVERY DAY  180 tablet  2  . LORazepam (ATIVAN) 1 MG tablet TAKE 1 TO 2 TABLETS AT BEDTIME AS NEEDED  60 tablet  0  . pravastatin (PRAVACHOL) 20 MG tablet TAKE 1 TABLET BY MOUTH EVERY DAY  90 tablet  1   No current facility-administered medications on file prior to visit.    Allergies  Allergen Reactions  . Metformin     REACTION: diarrhea    Past Medical History  Diagnosis Date  . Allergy   . Diabetes mellitus   . Hypertension   . Hx of adenomatous colonic polyps   . ED (erectile dysfunction)   . BPH (benign prostatic hypertrophy)   . Hyperlipidemia   . History of prostate cancer   . Osteoarthritis     Past Surgical History  Procedure Laterality Date  . Appendectomy  1956  . Foot fracture surgery  1996  . Knee arthroscopy  11/2005    right knee  . Sinus surgery with instatrak  09/2007  . Prostate surgery  01/2009    I-131 implants    No family history on file.  History   Social History  . Marital Status: Married    Spouse Name:  Rodney Suarez    Number of Children: 2  . Years of Education: Rodney Suarez   Occupational History  . retired     [employer] logistics/support/administrative officer   Social History Main Topics  . Smoking status: Former Smoker    Quit date: 04/03/1982  . Smokeless tobacco: Never Used  . Alcohol Use: No  . Drug Use: Not on file  . Sexual Activity: Not on file   Other Topics Concern  . Not on file   Social History Narrative   No living will   Wife and daughter should be health care POAs   Discussed DNR--he requests this (done 10/14/12)   No tube feedings if cognitively unaware   Review of Systems Did have a recent fall helping son load his car--mild injury to left shoulder Bowels okay    Objective:   Physical Exam  Constitutional: He appears well-developed and well-nourished. No distress.  Neck: Normal range of motion. Neck supple. No thyromegaly present.  Cardiovascular: Normal rate, regular rhythm, normal heart sounds and intact distal pulses.  Exam reveals no gallop.   No murmur heard. Pulmonary/Chest: Effort normal and breath sounds normal. No respiratory distress. He has no wheezes. He has no rales.  Musculoskeletal: He exhibits no edema and no tenderness.  Lymphadenopathy:    He has no cervical adenopathy.  Neurological:  Normal sensation in plantar feet  Skin: No rash noted.  Psychiatric: He has a normal mood and affect. His behavior is normal.          Assessment & Plan:

## 2013-10-22 NOTE — Progress Notes (Signed)
Pre visit review using our clinic review tool, if applicable. No additional management support is needed unless otherwise documented below in the visit note. 

## 2013-10-23 ENCOUNTER — Telehealth: Payer: Self-pay | Admitting: Internal Medicine

## 2013-10-23 NOTE — Telephone Encounter (Signed)
Relevant patient education assigned to patient using Emmi. ° °

## 2013-10-24 ENCOUNTER — Encounter: Payer: Self-pay | Admitting: *Deleted

## 2013-10-27 ENCOUNTER — Other Ambulatory Visit: Payer: Self-pay | Admitting: Internal Medicine

## 2013-10-27 NOTE — Telephone Encounter (Signed)
09/24/13 

## 2013-10-27 NOTE — Telephone Encounter (Signed)
rx called into pharmacy

## 2013-10-27 NOTE — Telephone Encounter (Signed)
Okay #60 x 0 

## 2013-11-05 ENCOUNTER — Encounter: Payer: Self-pay | Admitting: Internal Medicine

## 2013-12-05 ENCOUNTER — Other Ambulatory Visit: Payer: Self-pay | Admitting: Internal Medicine

## 2013-12-05 NOTE — Telephone Encounter (Signed)
Last filled 10/27/13.  Dr. Silvio Pate out of office.

## 2013-12-05 NOTE — Telephone Encounter (Signed)
plz phone in. 

## 2013-12-05 NOTE — Telephone Encounter (Signed)
Medication phoned to pharmacy.  

## 2013-12-31 ENCOUNTER — Ambulatory Visit (INDEPENDENT_AMBULATORY_CARE_PROVIDER_SITE_OTHER): Payer: Medicare Other

## 2013-12-31 DIAGNOSIS — Z23 Encounter for immunization: Secondary | ICD-10-CM

## 2014-01-12 ENCOUNTER — Other Ambulatory Visit: Payer: Self-pay | Admitting: Family Medicine

## 2014-01-12 NOTE — Telephone Encounter (Signed)
Ok to refill in Dr. Letvak's absence? 

## 2014-01-12 NOTE — Telephone Encounter (Signed)
plz phone in. 

## 2014-01-12 NOTE — Telephone Encounter (Signed)
Rx called in as directed.   

## 2014-01-14 ENCOUNTER — Other Ambulatory Visit: Payer: Self-pay | Admitting: Family Medicine

## 2014-02-20 ENCOUNTER — Other Ambulatory Visit: Payer: Self-pay | Admitting: Family Medicine

## 2014-02-20 NOTE — Telephone Encounter (Signed)
Approved: #60 x 0 Due for follow up in January

## 2014-02-20 NOTE — Telephone Encounter (Signed)
Ok to refill 

## 2014-02-20 NOTE — Telephone Encounter (Signed)
Rx called in to pharmacy. 

## 2014-03-07 ENCOUNTER — Other Ambulatory Visit: Payer: Self-pay | Admitting: Internal Medicine

## 2014-04-01 ENCOUNTER — Other Ambulatory Visit: Payer: Self-pay | Admitting: Family Medicine

## 2014-04-01 NOTE — Telephone Encounter (Signed)
rx called into pharmacy

## 2014-04-01 NOTE — Telephone Encounter (Signed)
Approved: #60 x 0 Due for appt to check labs and reeval

## 2014-04-30 ENCOUNTER — Other Ambulatory Visit: Payer: Self-pay | Admitting: Internal Medicine

## 2014-05-01 NOTE — Telephone Encounter (Signed)
Rx called in to requested pharmacy. Attempted to contact pt; vm full

## 2014-05-01 NOTE — Telephone Encounter (Signed)
Pt requesting medication refill. Last f/u appt 10/2013. pls advise

## 2014-05-01 NOTE — Telephone Encounter (Signed)
Approved: #60 x 0 He is overdue for diabetic follow up Have him set up within the next month

## 2014-06-09 ENCOUNTER — Other Ambulatory Visit: Payer: Self-pay | Admitting: Internal Medicine

## 2014-06-09 NOTE — Telephone Encounter (Signed)
Received refill request electronically from pharmacy. Last refill 05/01/14 #60, last office visit 10/22/13. Is it okay to refill medication?

## 2014-06-09 NOTE — Telephone Encounter (Signed)
plz phone in. 

## 2014-06-10 NOTE — Telephone Encounter (Signed)
Rx called in as directed.   

## 2014-06-23 ENCOUNTER — Telehealth: Payer: Self-pay | Admitting: *Deleted

## 2014-06-23 NOTE — Telephone Encounter (Signed)
Patient called stating that the home health nurse with [facility] came by today for his annual check. Patient stated that the nurse told him that he has a heart murmur and that was not noted on his records. Patient stated that the nurse told him that it was not a bad one, but wanted to make sure that you were aware of it. Patient stated that the nurse will be sending you a report. Patient stated that he was not aware of this either. Please let patient know what he should do.

## 2014-06-23 NOTE — Telephone Encounter (Signed)
Most murmurs are not a concern I don't think I have heard one in the past but if he feels okay, no need to do anything. I will just check him well at his next visit

## 2014-06-24 NOTE — Telephone Encounter (Signed)
Spoke with patient's wife and advised results  

## 2014-06-29 ENCOUNTER — Other Ambulatory Visit: Payer: Self-pay | Admitting: Internal Medicine

## 2014-07-06 ENCOUNTER — Ambulatory Visit (INDEPENDENT_AMBULATORY_CARE_PROVIDER_SITE_OTHER): Payer: Medicare Other | Admitting: Internal Medicine

## 2014-07-06 ENCOUNTER — Encounter: Payer: Self-pay | Admitting: Internal Medicine

## 2014-07-06 VITALS — BP 130/70 | HR 60 | Temp 97.8°F | Wt 221.0 lb

## 2014-07-06 DIAGNOSIS — S86911A Strain of unspecified muscle(s) and tendon(s) at lower leg level, right leg, initial encounter: Secondary | ICD-10-CM | POA: Insufficient documentation

## 2014-07-06 DIAGNOSIS — I10 Essential (primary) hypertension: Secondary | ICD-10-CM

## 2014-07-06 DIAGNOSIS — E119 Type 2 diabetes mellitus without complications: Secondary | ICD-10-CM

## 2014-07-06 LAB — HM DIABETES FOOT EXAM

## 2014-07-06 NOTE — Assessment & Plan Note (Signed)
BP Readings from Last 3 Encounters:  07/06/14 130/70  10/22/13 122/80  10/14/12 140/80   Good control No changes needed

## 2014-07-06 NOTE — Progress Notes (Signed)
Pre visit review using our clinic review tool, if applicable. No additional management support is needed unless otherwise documented below in the visit note. 

## 2014-07-06 NOTE — Progress Notes (Signed)
Subjective:    Patient ID: Rodney Suarez, male    DOB: [birth year], 74 y.o.   MRN: [id number]  HPI Here due to right knee and ankle pain Overdue for follow up of diabetes  Several days ago--riding mower didn't work and he had to push mow his entire yard (~2 acres) Long standing stiffness in right knee--- past arthroscopy Knee also swelled up Right ankle also swelled--over lateral malleolus Not much pain in ankle though  Swelling is better than at first He did ice it at first Uses a couple of aleve--seemed to help Able to walk on it Brace helps him  No problems with diabetic control Checks daily-- usually under 120 No sores or pain in feet  No chest pain No SOB No dizziness or syncope  Current Outpatient Prescriptions on File Prior to Visit  Medication Sig Dispense Refill  . amLODipine (NORVASC) 10 MG tablet TAKE 1 TABLET EVERY DAY 90 tablet 2  . aspirin EC 81 MG tablet Take 81 mg by mouth daily.    Marland Kitchen ketoconazole (NIZORAL) 2 % cream Apply 1 application topically 2 (two) times daily as needed for irritation. 60 g 1  . lisinopril-hydrochlorothiazide (PRINZIDE,ZESTORETIC) 20-12.5 MG per tablet TAKE 2 TABLETS BY MOUTH EVERY DAY 180 tablet 2  . LORazepam (ATIVAN) 1 MG tablet TAKE 1 TO 2 TABLETS AT BEDTIME AS NEEDED 60 tablet 0  . pravastatin (PRAVACHOL) 20 MG tablet TAKE 1 TABLET BY MOUTH EVERY DAY 90 tablet 3   No current facility-administered medications on file prior to visit.    Allergies  Allergen Reactions  . Metformin     REACTION: diarrhea    Past Medical History  Diagnosis Date  . Allergy   . Diabetes mellitus   . Hypertension   . Hx of adenomatous colonic polyps   . ED (erectile dysfunction)   . BPH (benign prostatic hypertrophy)   . Hyperlipidemia   . History of prostate cancer   . Osteoarthritis     Past Surgical History  Procedure Laterality Date  . Appendectomy  1956  . Foot fracture surgery  1996  . Knee arthroscopy  11/2005    right knee  .  Sinus surgery with instatrak  09/2007  . Prostate surgery  01/2009    I-131 implants    No family history on file.  History   Social History  . Marital Status: Married    Spouse Name: N/A  . Number of Children: 2  . Years of Education: N/A   Occupational History  . retired     [employer] logistics/support/administrative officer   Social History Main Topics  . Smoking status: Former Smoker    Quit date: 04/03/1982  . Smokeless tobacco: Never Used  . Alcohol Use: No  . Drug Use: Not on file  . Sexual Activity: Not on file   Other Topics Concern  . Not on file   Social History Narrative   No living will   Wife and daughter should be health care POAs   Discussed DNR--he requests this (done 10/14/12)   No tube feedings if cognitively unaware   Review of Systems Sleeps okay in general Appetite is fine Weight is stable    Objective:   Physical Exam  Constitutional: He appears well-developed and well-nourished. No distress.  Neck: Normal range of motion. Neck supple. No thyromegaly present.  Cardiovascular: Normal rate, regular rhythm, normal heart sounds and intact distal pulses.  Exam reveals no gallop.   No murmur heard. Pulmonary/Chest: Effort  normal and breath sounds normal. No respiratory distress. He has no wheezes. He has no rales.  Musculoskeletal: He exhibits no edema or tenderness.  No right ankle swelling. No malleolar tenderness. Normal ROM  No right knee swelling. Moderate crepitus but full passive ROM. No ligament or meniscus findings  Lymphadenopathy:    He has no cervical adenopathy.  Neurological:  Normal sensation on plantar feet  Skin:  No foot lesions  Psychiatric: He has a normal mood and affect. His behavior is normal.          Assessment & Plan:

## 2014-07-06 NOTE — Assessment & Plan Note (Signed)
Overdid it--on top of baseline arthritis Improving Discussed brace, ice if exacerbates and occasional aleve No ortho eval needed

## 2014-07-06 NOTE — Assessment & Plan Note (Signed)
Seems to have good control Will just recheck A1c

## 2014-07-07 ENCOUNTER — Encounter: Payer: Self-pay | Admitting: *Deleted

## 2014-07-07 LAB — HEMOGLOBIN A1C: Hgb A1c MFr Bld: 6 % (ref 4.6–6.5)

## 2014-07-20 ENCOUNTER — Other Ambulatory Visit: Payer: Self-pay | Admitting: Family Medicine

## 2014-07-20 NOTE — Telephone Encounter (Signed)
Ok to refill 

## 2014-07-20 NOTE — Telephone Encounter (Signed)
Approved: #60 x 0 

## 2014-07-21 NOTE — Telephone Encounter (Signed)
rx called into pharmacy

## 2014-09-14 ENCOUNTER — Other Ambulatory Visit: Payer: Self-pay | Admitting: Family Medicine

## 2014-09-14 NOTE — Telephone Encounter (Signed)
Rx called in as directed.   

## 2014-09-14 NOTE — Telephone Encounter (Signed)
plz phone in. 

## 2014-11-10 ENCOUNTER — Other Ambulatory Visit: Payer: Self-pay | Admitting: Family Medicine

## 2014-11-10 NOTE — Telephone Encounter (Signed)
This is Dr. Everardo Beals pt, will send to him as he is in office today

## 2014-11-10 NOTE — Telephone Encounter (Signed)
Approved:okay #60 x 0 

## 2014-11-10 NOTE — Telephone Encounter (Signed)
09/04/2014 

## 2014-11-10 NOTE — Telephone Encounter (Signed)
rx called into pharmacy

## 2014-11-10 NOTE — Telephone Encounter (Signed)
Ok to refill in Dr. Synthia Innocent absence? Last filled 09/14/14 #60 0RF

## 2014-11-18 ENCOUNTER — Ambulatory Visit (INDEPENDENT_AMBULATORY_CARE_PROVIDER_SITE_OTHER)
Admission: RE | Admit: 2014-11-18 | Discharge: 2014-11-18 | Disposition: A | Discharge status: Home / Self Care | Payer: Medicare Other | Source: Ambulatory Visit | Attending: Family Medicine | Admitting: Family Medicine

## 2014-11-18 ENCOUNTER — Ambulatory Visit (INDEPENDENT_AMBULATORY_CARE_PROVIDER_SITE_OTHER): Payer: Medicare Other | Admitting: Family Medicine

## 2014-11-18 VITALS — BP 138/76 | HR 66 | Temp 98.6°F | Ht 72.0 in | Wt 216.5 lb

## 2014-11-18 DIAGNOSIS — M25561 Pain in right knee: Secondary | ICD-10-CM | POA: Diagnosis not present

## 2014-11-18 DIAGNOSIS — M1711 Unilateral primary osteoarthritis, right knee: Secondary | ICD-10-CM | POA: Diagnosis not present

## 2014-11-18 MED ORDER — METHYLPREDNISOLONE ACETATE 40 MG/ML IJ SUSP
80.0000 mg | Freq: Once | INTRAMUSCULAR | Status: AC
  Administered 2014-11-18: 80 mg via INTRA_ARTICULAR

## 2014-11-18 MED ORDER — MELOXICAM 15 MG PO TABS: 15.0000 mg | ORAL_TABLET | Freq: Every day | ORAL | Status: DC
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
## 2014-11-18 NOTE — Progress Notes (Signed)
Pre visit review using our clinic review tool, if applicable. No additional management support is needed unless otherwise documented below in the visit note. 

## 2014-11-18 NOTE — Patient Instructions (Signed)
After you have an injection of any joint, the numbing medicine will make it feel better for a few hours. Later tonight, it is common for the joint to feel worse. The steroid will take 48-72 hours to start working - it is the thing that will likely provide the most relief.  Ice the joint where you had the injection at least 2-3 times a day for 20 minutes for 3 days. If have had swelling, pain in the joint itself, ice for 1 week.  You can use an ice bag, frozen peas or corn, an ice pack - all work

## 2014-11-21 ENCOUNTER — Encounter: Payer: Self-pay | Admitting: Family Medicine

## 2014-11-25 ENCOUNTER — Encounter: Payer: Self-pay | Admitting: Family Medicine

## 2014-11-25 ENCOUNTER — Ambulatory Visit (INDEPENDENT_AMBULATORY_CARE_PROVIDER_SITE_OTHER): Payer: Medicare Other | Admitting: Family Medicine

## 2014-11-25 VITALS — BP 150/90 | HR 58 | Temp 98.0°F | Ht 72.0 in | Wt 216.2 lb

## 2014-11-25 DIAGNOSIS — M25561 Pain in right knee: Secondary | ICD-10-CM

## 2014-11-25 DIAGNOSIS — M1711 Unilateral primary osteoarthritis, right knee: Secondary | ICD-10-CM

## 2014-11-25 NOTE — Progress Notes (Signed)
Pre visit review using our clinic review tool, if applicable. No additional management support is needed unless otherwise documented below in the visit note. 
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
## 2014-11-25 NOTE — Patient Instructions (Signed)

## 2014-12-01 DIAGNOSIS — M25561 Pain in right knee: Secondary | ICD-10-CM | POA: Diagnosis not present

## 2014-12-01 DIAGNOSIS — M1711 Unilateral primary osteoarthritis, right knee: Secondary | ICD-10-CM | POA: Diagnosis not present

## 2014-12-08 DIAGNOSIS — M1711 Unilateral primary osteoarthritis, right knee: Secondary | ICD-10-CM | POA: Diagnosis not present

## 2014-12-14 ENCOUNTER — Encounter: Payer: Self-pay | Admitting: Internal Medicine

## 2014-12-14 ENCOUNTER — Ambulatory Visit (INDEPENDENT_AMBULATORY_CARE_PROVIDER_SITE_OTHER): Payer: Medicare Other | Admitting: Internal Medicine

## 2014-12-14 VITALS — BP 140/80 | HR 61 | Temp 98.0°F | Wt 212.0 lb

## 2014-12-14 DIAGNOSIS — R252 Cramp and spasm: Secondary | ICD-10-CM | POA: Diagnosis not present

## 2014-12-14 MED ORDER — MELOXICAM 7.5 MG PO TABS: 7.5000 mg | ORAL_TABLET | Freq: Two times a day (BID) | ORAL | Status: DC | PRN

## 2014-12-14 NOTE — Progress Notes (Signed)
   Subjective:    Patient ID: Rodney Suarez, male    DOB: 26-May-1940, 74 y.o.   MRN: [id number]  HPI Here due to leg cramping  Reviewed Dr Lillie Fragmin notes Referred to Dr Marry Guan-- saw Rodney Suarez Not too bad-- got first synvisc (or similar) shot--may be some better  Now having cramps at night in past week In feet and by toes also Actually had to pull over in car after severe cramp in right calf No new meds--but ran out of meloxicam 2 weeks ago  Drinking lots of water Hasn't tried anything for this--tried small amount of vinegar (no help)  Current Outpatient Prescriptions on File Prior to Visit  Medication Sig Dispense Refill  . amLODipine (NORVASC) 10 MG tablet TAKE 1 TABLET EVERY DAY 90 tablet 2  . aspirin EC 81 MG tablet Take 81 mg by mouth daily.    Marland Kitchen ketoconazole (NIZORAL) 2 % cream Apply 1 application topically 2 (two) times daily as needed for irritation. 60 g 1  . lisinopril-hydrochlorothiazide (PRINZIDE,ZESTORETIC) 20-12.5 MG per tablet TAKE 2 TABLETS BY MOUTH EVERY DAY 180 tablet 2  . LORazepam (ATIVAN) 1 MG tablet TAKE 1 TO 2 TABLETS AT BEDTIME 60 tablet 0  . meloxicam (MOBIC) 15 MG tablet Take 1 tablet (15 mg total) by mouth daily. 30 tablet 3  . pravastatin (PRAVACHOL) 20 MG tablet TAKE 1 TABLET BY MOUTH EVERY DAY 90 tablet 3   No current facility-administered medications on file prior to visit.    Allergies  Allergen Reactions  . Metformin     REACTION: diarrhea    Past Medical History  Diagnosis Date  . Allergy   . Diabetes mellitus   . Hypertension   . Hx of adenomatous colonic polyps   . ED (erectile dysfunction)   . BPH (benign prostatic hypertrophy)   . Hyperlipidemia   . History of prostate cancer   . Osteoarthritis     Past Surgical History  Procedure Laterality Date  . Appendectomy  1956  . Foot fracture surgery  1996  . Knee arthroscopy  11/2005    right knee  . Sinus surgery with instatrak  09/2007  . Prostate surgery  01/2009    I-131  implants    No family history on file.  Social History   Social History  . Marital Status: Married    Spouse Name: N/A  . Number of Children: 2  . Years of Education: N/A   Occupational History  . retired     Armed forces logistics/support/administrative officer   Social History Main Topics  . Smoking status: Former Smoker    Quit date: 04/03/1982  . Smokeless tobacco: Never Used  . Alcohol Use: No  . Drug Use: Not on file  . Sexual Activity: Not on file   Other Topics Concern  . Not on file   Social History Narrative   No living will   Wife and daughter should be health care POAs   Discussed DNR--he requests this (done 10/14/12)   No tube feedings if cognitively unaware   Review of Systems No other myalgias or muscle aches Sleeps okay--unless the cramp awakens him    Objective:   Physical Exam  Constitutional: He appears well-developed and well-nourished. No distress.  Musculoskeletal:  No muscle tenderness No calf swelling or tenderness  Neurological:  Gait slow but not antalgic          Assessment & Plan:

## 2014-12-14 NOTE — Patient Instructions (Signed)
Please consider trying some mustard before bedtime.  You can use the lower dose of meloxicam at bedtime for now to see if that prevents the muscle cramps.

## 2014-12-14 NOTE — Assessment & Plan Note (Signed)
Unclear reason Just recently I don't think it is related to the synvisc Will refill the meloxicam ?try mustard at bedtime

## 2014-12-14 NOTE — Progress Notes (Signed)
Pre visit review using our clinic review tool, if applicable. No additional management support is needed unless otherwise documented below in the visit note. 

## 2014-12-15 DIAGNOSIS — M1711 Unilateral primary osteoarthritis, right knee: Secondary | ICD-10-CM | POA: Diagnosis not present

## 2014-12-22 DIAGNOSIS — M1711 Unilateral primary osteoarthritis, right knee: Secondary | ICD-10-CM | POA: Diagnosis not present

## 2014-12-25 ENCOUNTER — Other Ambulatory Visit: Payer: Self-pay | Admitting: Internal Medicine

## 2015-01-03 ENCOUNTER — Other Ambulatory Visit: Payer: Self-pay | Admitting: Internal Medicine

## 2015-01-04 NOTE — Telephone Encounter (Signed)
Approved: #60 x 0 

## 2015-01-04 NOTE — Telephone Encounter (Signed)
11/10/14 

## 2015-01-04 NOTE — Telephone Encounter (Signed)
rx called into pharmacy

## 2015-01-12 DIAGNOSIS — M1711 Unilateral primary osteoarthritis, right knee: Secondary | ICD-10-CM | POA: Diagnosis not present

## 2015-01-15 ENCOUNTER — Ambulatory Visit (INDEPENDENT_AMBULATORY_CARE_PROVIDER_SITE_OTHER): Payer: Medicare Other | Admitting: Internal Medicine

## 2015-01-15 ENCOUNTER — Encounter: Payer: Self-pay | Admitting: Internal Medicine

## 2015-01-15 VITALS — BP 128/80 | HR 63 | Temp 97.9°F | Ht 72.0 in | Wt 215.0 lb

## 2015-01-15 DIAGNOSIS — E785 Hyperlipidemia, unspecified: Secondary | ICD-10-CM

## 2015-01-15 DIAGNOSIS — R413 Other amnesia: Secondary | ICD-10-CM | POA: Diagnosis not present

## 2015-01-15 DIAGNOSIS — I10 Essential (primary) hypertension: Secondary | ICD-10-CM | POA: Diagnosis not present

## 2015-01-15 DIAGNOSIS — Z23 Encounter for immunization: Secondary | ICD-10-CM

## 2015-01-15 DIAGNOSIS — E119 Type 2 diabetes mellitus without complications: Secondary | ICD-10-CM | POA: Diagnosis not present

## 2015-01-15 DIAGNOSIS — Z0000 Encounter for general adult medical examination without abnormal findings: Secondary | ICD-10-CM

## 2015-01-15 LAB — COMPREHENSIVE METABOLIC PANEL
ALBUMIN: 4.2 g/dL (ref 3.5–5.2)
ALT: 36 U/L (ref 0–53)
AST: 33 U/L (ref 0–37)
Alkaline Phosphatase: 58 U/L (ref 39–117)
BILIRUBIN TOTAL: 1.7 mg/dL — AB (ref 0.2–1.2)
BUN: 24 mg/dL — ABNORMAL HIGH (ref 6–23)
CALCIUM: 9.3 mg/dL (ref 8.4–10.5)
CO2: 32 meq/L (ref 19–32)
CREATININE: 1 mg/dL (ref 0.40–1.50)
Chloride: 102 mEq/L (ref 96–112)
GFR: 93.41 mL/min (ref >60.00)
Glucose, Bld: 131 mg/dL — ABNORMAL HIGH (ref 70–99)
Potassium: 4 mEq/L (ref 3.5–5.1)
Sodium: 139 mEq/L (ref 135–145)
Total Protein: 7.3 g/dL (ref 6.0–8.3)

## 2015-01-15 LAB — LIPID PANEL
CHOLESTEROL: 176 mg/dL (ref 0–200)
HDL: 70.2 mg/dL (ref >39.00)
LDL CALC: 95 mg/dL (ref 0–99)
NonHDL: 105.48
TRIGLYCERIDES: 51 mg/dL (ref 0.0–149.0)
Total CHOL/HDL Ratio: 3
VLDL: 10.2 mg/dL (ref 0.0–40.0)

## 2015-01-15 LAB — CBC WITH DIFFERENTIAL/PLATELET
BASOS ABS: 0 10*3/uL (ref 0.0–0.1)
Basophils Relative: 0.4 % (ref 0.0–3.0)
EOS ABS: 0.1 10*3/uL (ref 0.0–0.7)
Eosinophils Relative: 1 % (ref 0.0–5.0)
HEMATOCRIT: 41 % (ref 39.0–52.0)
HEMOGLOBIN: 13.8 g/dL (ref 13.0–17.0)
LYMPHS PCT: 31.9 % (ref 12.0–46.0)
Lymphs Abs: 1.7 10*3/uL (ref 0.7–4.0)
MCHC: 33.6 g/dL (ref 30.0–36.0)
MCV: 99.3 fl (ref 78.0–100.0)
MONOS PCT: 9.5 % (ref 3.0–12.0)
Monocytes Absolute: 0.5 10*3/uL (ref 0.1–1.0)
Neutro Abs: 3.1 10*3/uL (ref 1.4–7.7)
Neutrophils Relative %: 57.2 % (ref 43.0–77.0)
Platelets: 184 10*3/uL (ref 150.0–400.0)
RBC: 4.13 Mil/uL — ABNORMAL LOW (ref 4.22–5.81)
RDW: 15.5 % (ref 11.5–15.5)
WBC: 5.5 10*3/uL (ref 4.0–10.5)

## 2015-01-15 LAB — HEMOGLOBIN A1C: Hgb A1c MFr Bld: 6.1 % (ref 4.6–6.5)

## 2015-01-15 LAB — VITAMIN B12: Vitamin B-12: 326 pg/mL (ref 211–911)

## 2015-01-15 LAB — T4, FREE: Free T4: 1.13 ng/dL (ref 0.60–1.60)

## 2015-01-15 MED ORDER — ZOSTER VACCINE LIVE 19400 UNT/0.65ML ~~LOC~~ SOLR: 0.6500 mL | Freq: Once | SUBCUTANEOUS | Status: DC

## 2015-01-15 NOTE — Assessment & Plan Note (Signed)
Didn't do well on testing Will check labs He will discuss with wife--but no functional deficits

## 2015-01-15 NOTE — Addendum Note (Signed)
Addended by: Despina Hidden on: 01/15/2015 11:22 AM   Modules accepted: Orders, Medications

## 2015-01-15 NOTE — Assessment & Plan Note (Signed)
Seems to still have good control Labs today Has eye exam in 2 weeks

## 2015-01-15 NOTE — Progress Notes (Signed)
Pre visit review using our clinic review tool, if applicable. No additional management support is needed unless otherwise documented below in the visit note. 

## 2015-01-15 NOTE — Assessment & Plan Note (Signed)
No problems with statin 

## 2015-01-15 NOTE — Assessment & Plan Note (Signed)
BP Readings from Last 3 Encounters:  01/15/15 128/80  12/14/14 140/80  11/25/14 150/90   Good control No change needed

## 2015-01-15 NOTE — Progress Notes (Signed)
Subjective:    Patient ID: Rodney Suarez, male    DOB: 1940/07/09, 74 y.o.   MRN: [id number]  HPI Here for Medicare wellness visit and follow up on chronic medical conditions Reviewed form and advanced directives Reviewed other doctors No tobacco or alcohol Has had to stop walking due to the knee pain Vision and hearing are okay No falls No depression or anhedonia Remains independent with instrumental ADLs No cognitive changes  Had had the 3 shot series in right knee Had follow up without improvement--- got another shot (steroid) Still not great--thinks he will eventually need TKR Cramps did go away  Checks sugars every day or every other day Runs 100-110 usually No Rx No numbness, pain or foot sores Has appt for eye exam later this month  Voids okay Nocturia is only occasional No significant daytime problems  No problems with statin No myalgias or GI problems  No chest pain No palpitations No dizziness or syncope No edema No SOB  Current Outpatient Prescriptions on File Prior to Visit  Medication Sig Dispense Refill  . amLODipine (NORVASC) 10 MG tablet TAKE 1 TABLET EVERY DAY 90 tablet 2  . aspirin EC 81 MG tablet Take 81 mg by mouth daily.    Marland Kitchen ketoconazole (NIZORAL) 2 % cream Apply 1 application topically 2 (two) times daily as needed for irritation. 60 g 1  . lisinopril-hydrochlorothiazide (PRINZIDE,ZESTORETIC) 20-12.5 MG per tablet TAKE 2 TABLETS BY MOUTH EVERY DAY 180 tablet 2  . LORazepam (ATIVAN) 1 MG tablet TAKE 1 TO 2 TABLETS AT BEDTIME 60 tablet 0  . meloxicam (MOBIC) 7.5 MG tablet Take 1 tablet (7.5 mg total) by mouth 2 (two) times daily as needed for pain. 60 tablet 3  . pravastatin (PRAVACHOL) 20 MG tablet TAKE 1 TABLET BY MOUTH EVERY DAY 90 tablet 3   No current facility-administered medications on file prior to visit.    Allergies  Allergen Reactions  . Metformin     REACTION: diarrhea    Past Medical History  Diagnosis Date  . Allergy     . Diabetes mellitus   . Hypertension   . Hx of adenomatous colonic polyps   . ED (erectile dysfunction)   . BPH (benign prostatic hypertrophy)   . Hyperlipidemia   . History of prostate cancer   . Osteoarthritis     Past Surgical History  Procedure Laterality Date  . Appendectomy  1956  . Foot fracture surgery  1996  . Knee arthroscopy  11/2005    right knee  . Sinus surgery with instatrak  09/2007  . Prostate surgery  01/2009    I-131 implants    No family history on file.  Social History   Social History  . Marital Status: Married    Spouse Name: N/A  . Number of Children: 2  . Years of Education: N/A   Occupational History  . retired     Armed forces logistics/support/administrative officer   Social History Main Topics  . Smoking status: Former Smoker    Quit date: 04/03/1982  . Smokeless tobacco: Never Used  . Alcohol Use: No  . Drug Use: Not on file  . Sexual Activity: Not on file   Other Topics Concern  . Not on file   Social History Narrative   No living will   Wife and daughter should be health care POAs   Discussed DNR--he requests this (done 10/14/12)   No tube feedings if cognitively unaware   Review of Systems Appetite  is good Weight stable Sleeps fairly well Wears seat belt Teeth need some work No other joint pain No skin problems    Objective:   Physical Exam  Constitutional: He is oriented to person, place, and time. He appears well-developed and well-nourished. No distress.  HENT:  Mouth/Throat: Oropharynx is clear and moist. No oropharyngeal exudate.  Neck: Normal range of motion. Neck supple. No thyromegaly present.  Cardiovascular: Normal rate, regular rhythm, normal heart sounds and intact distal pulses.  Exam reveals no gallop.   No murmur heard. Pulmonary/Chest: Effort normal and breath sounds normal. No respiratory distress. He has no wheezes. He has no rales.  Abdominal: Soft. There is no tenderness.  Musculoskeletal: He exhibits no edema or  tenderness.  Lymphadenopathy:    He has no cervical adenopathy.  Neurological: He is alert and oriented to person, place, and time.  President--- "Dorie Rank Obama, Ulysses, ?" [phone].... D-o-r-l Recall 0/3  Skin: No rash noted. No erythema.  No foot lesions  Psychiatric: He has a normal mood and affect. His behavior is normal.          Assessment & Plan:

## 2015-01-15 NOTE — Assessment & Plan Note (Signed)
I have personally reviewed the Medicare Annual Wellness questionnaire and have noted 1. The patient's medical and social history 2. Their use of alcohol, tobacco or illicit drugs 3. Their current medications and supplements 4. The patient's functional ability including ADL's, fall risks, home safety risks and hearing or visual             impairment. 5. Diet and physical activities 6. Evidence for depression or mood disorders  The patients weight, height, BMI and visual acuity have been recorded in the chart I have made referrals, counseling and provided education to the patient based review of the above and I have provided the pt with a written personalized care plan for preventive services.  I have provided you with a copy of your personalized plan for preventive services. Please take the time to review along with your updated medication list.  Rx for zostavax Flu/prevnar today No PSA due to age (history of prostate cancer but long ago) Colon done 2011--no polyps then so no follow up due to age

## 2015-01-18 ENCOUNTER — Encounter: Payer: Self-pay | Admitting: *Deleted

## 2015-02-01 ENCOUNTER — Other Ambulatory Visit: Payer: Self-pay | Admitting: Internal Medicine

## 2015-02-01 DIAGNOSIS — H40003 Preglaucoma, unspecified, bilateral: Secondary | ICD-10-CM | POA: Diagnosis not present

## 2015-02-01 LAB — HM DIABETES EYE EXAM

## 2015-02-09 ENCOUNTER — Encounter: Payer: Self-pay | Admitting: Internal Medicine

## 2015-03-04 ENCOUNTER — Other Ambulatory Visit: Payer: Self-pay | Admitting: Internal Medicine

## 2015-03-04 NOTE — Telephone Encounter (Signed)
Approved: #60 x 0 

## 2015-03-04 NOTE — Telephone Encounter (Signed)
01/04/15 

## 2015-03-05 NOTE — Telephone Encounter (Signed)
rx called into pharmacy

## 2015-04-08 ENCOUNTER — Encounter: Payer: Self-pay | Admitting: Internal Medicine

## 2015-04-15 ENCOUNTER — Other Ambulatory Visit: Payer: Self-pay | Admitting: Internal Medicine

## 2015-04-15 NOTE — Telephone Encounter (Signed)
03/05/2015

## 2015-04-15 NOTE — Telephone Encounter (Signed)
rx called into pharmacy

## 2015-04-15 NOTE — Telephone Encounter (Signed)
Approved: #60 x 0 

## 2015-04-16 DIAGNOSIS — M1711 Unilateral primary osteoarthritis, right knee: Secondary | ICD-10-CM | POA: Diagnosis not present

## 2015-04-16 DIAGNOSIS — M25561 Pain in right knee: Secondary | ICD-10-CM | POA: Diagnosis not present

## 2015-06-14 ENCOUNTER — Other Ambulatory Visit: Payer: Self-pay | Admitting: Internal Medicine

## 2015-06-14 NOTE — Telephone Encounter (Signed)
Last Refill 04-15-15 #60/0 Last OV 01-15-15 No Next OV scheduled

## 2015-06-15 NOTE — Telephone Encounter (Signed)
Refill called in on voice mail at pharmacy

## 2015-06-15 NOTE — Telephone Encounter (Signed)
plz phone in. 

## 2015-08-02 DIAGNOSIS — H40003 Preglaucoma, unspecified, bilateral: Secondary | ICD-10-CM | POA: Diagnosis not present

## 2015-08-06 ENCOUNTER — Other Ambulatory Visit: Payer: Self-pay | Admitting: Internal Medicine

## 2015-08-06 NOTE — Telephone Encounter (Signed)
Left refill on voice mail at pharmacy  

## 2015-08-06 NOTE — Telephone Encounter (Signed)
Spoke to pt. Made [facility] Exam appt 02-10-16

## 2015-08-06 NOTE — Telephone Encounter (Signed)
Last filled 06-15-15 #60 Last OV 01-15-15 No future visit

## 2015-08-06 NOTE — Telephone Encounter (Signed)
Approved: #60 x 0 Have him schedule wellness visit for October or November

## 2015-08-08 ENCOUNTER — Other Ambulatory Visit: Payer: Self-pay | Admitting: Internal Medicine

## 2015-09-12 ENCOUNTER — Other Ambulatory Visit: Payer: Self-pay | Admitting: Internal Medicine

## 2015-10-08 ENCOUNTER — Other Ambulatory Visit: Payer: Self-pay | Admitting: Internal Medicine

## 2015-10-08 NOTE — Telephone Encounter (Signed)
Approved: #60 x 0 

## 2015-10-08 NOTE — Telephone Encounter (Signed)
Last filled 08-06-15 #60 Last OV 01-15-15 Next OV 02-10-16

## 2015-10-11 NOTE — Telephone Encounter (Signed)
Left refill on voice mail at pharmacy  

## 2015-11-10 ENCOUNTER — Other Ambulatory Visit: Payer: Self-pay | Admitting: Internal Medicine

## 2015-11-10 NOTE — Telephone Encounter (Signed)
Lorazepam last filled 10-11-15 #60 Last OV 01-15-15 Next OV 02-10-16

## 2015-11-11 NOTE — Telephone Encounter (Signed)
Approved: #60 x 0 

## 2015-11-11 NOTE — Telephone Encounter (Signed)
Left refill on voice mail at pharmacy  

## 2015-12-18 ENCOUNTER — Other Ambulatory Visit: Payer: Self-pay | Admitting: Internal Medicine

## 2015-12-20 NOTE — Telephone Encounter (Signed)
Left refill on voice mail at pharmacy  

## 2015-12-20 NOTE — Telephone Encounter (Signed)
Last filled 11-11-15 #60 Last OV 01-15-15 Next OV 02-10-16

## 2015-12-20 NOTE — Telephone Encounter (Signed)
Approved: #60 x 0 

## 2015-12-23 DIAGNOSIS — M1711 Unilateral primary osteoarthritis, right knee: Secondary | ICD-10-CM | POA: Diagnosis not present

## 2015-12-31 DIAGNOSIS — M1711 Unilateral primary osteoarthritis, right knee: Secondary | ICD-10-CM | POA: Insufficient documentation

## 2016-01-21 ENCOUNTER — Other Ambulatory Visit: Payer: Self-pay | Admitting: Internal Medicine

## 2016-01-24 NOTE — Telephone Encounter (Signed)
Approved: #60 x 0 

## 2016-01-24 NOTE — Telephone Encounter (Signed)
Last filled 12-20-15 #60 Last OV 01-15-15 Next OV 02-10-16

## 2016-01-24 NOTE — Telephone Encounter (Signed)
Left refill on voice mail at pharmacy  

## 2016-01-31 DIAGNOSIS — H40003 Preglaucoma, unspecified, bilateral: Secondary | ICD-10-CM | POA: Diagnosis not present

## 2016-02-02 ENCOUNTER — Ambulatory Visit (INDEPENDENT_AMBULATORY_CARE_PROVIDER_SITE_OTHER): Payer: Medicare Other

## 2016-02-02 DIAGNOSIS — Z23 Encounter for immunization: Secondary | ICD-10-CM

## 2016-02-07 DIAGNOSIS — E119 Type 2 diabetes mellitus without complications: Secondary | ICD-10-CM | POA: Diagnosis not present

## 2016-02-07 LAB — HM DIABETES EYE EXAM

## 2016-02-10 ENCOUNTER — Encounter: Payer: Self-pay | Admitting: Internal Medicine

## 2016-02-10 ENCOUNTER — Ambulatory Visit (INDEPENDENT_AMBULATORY_CARE_PROVIDER_SITE_OTHER): Payer: Medicare Other | Admitting: Internal Medicine

## 2016-02-10 VITALS — BP 122/80 | HR 75 | Temp 98.1°F | Ht 70.5 in | Wt 216.0 lb

## 2016-02-10 DIAGNOSIS — Z7189 Other specified counseling: Secondary | ICD-10-CM | POA: Insufficient documentation

## 2016-02-10 DIAGNOSIS — Z0000 Encounter for general adult medical examination without abnormal findings: Secondary | ICD-10-CM | POA: Diagnosis not present

## 2016-02-10 DIAGNOSIS — I10 Essential (primary) hypertension: Secondary | ICD-10-CM | POA: Diagnosis not present

## 2016-02-10 DIAGNOSIS — E785 Hyperlipidemia, unspecified: Secondary | ICD-10-CM | POA: Diagnosis not present

## 2016-02-10 DIAGNOSIS — E119 Type 2 diabetes mellitus without complications: Secondary | ICD-10-CM | POA: Diagnosis not present

## 2016-02-10 LAB — HM DIABETES FOOT EXAM

## 2016-02-10 NOTE — Assessment & Plan Note (Signed)
BP Readings from Last 3 Encounters:  02/10/16 122/80  01/15/15 128/80  12/14/14 140/80   Good control now On ACEI

## 2016-02-10 NOTE — Assessment & Plan Note (Signed)
Still controlled without meds Will check labs

## 2016-02-10 NOTE — Assessment & Plan Note (Signed)
Has DNR 

## 2016-02-10 NOTE — Assessment & Plan Note (Addendum)
I have personally reviewed the Medicare Annual Wellness questionnaire and have noted 1. The patient's medical and social history 2. Their use of alcohol, tobacco or illicit drugs 3. Their current medications and supplements 4. The patient's functional ability including ADL's, fall risks, home safety risks and hearing or visual             impairment. 5. Diet and physical activities 6. Evidence for depression or mood disorders  The patients weight, height, BMI and visual acuity have been recorded in the chart I have made referrals, counseling and provided education to the patient based review of the above and I have provided the pt with a written personalized care plan for preventive services.  I have provided you with a copy of your personalized plan for preventive services. Please take the time to review along with your updated medication list.  UTD on imms No cancer screening due to age Discussed fitness (after TKR) Did better on memory test and family not aware of any problems

## 2016-02-10 NOTE — Progress Notes (Signed)
Pre visit review using our clinic review tool, if applicable. No additional management support is needed unless otherwise documented below in the visit note. 

## 2016-02-10 NOTE — Progress Notes (Signed)
Subjective:    Patient ID: Rodney Suarez, male    DOB: [birth year], 75 y.o.   MRN: [id number]  HPI Here for Medicare wellness and follow up of chronic health conditions Reviewed form and advanced directives Reviewed other doctors No alcohol or tobacco No falls Vision and hearing are okay Hasn't been able to exercise Independent with instrumental ADLs Not aware of memory problems--- wife and daughter haven't seen anything  Scheduled to have right TKR next month  Checks sugars nearly every day Tries to eat right Usually under 100  Just had eye exam 2 days ago--everything fine No foot numbness, sores or pain  No problems with BP or cholesterol meds No chest pain or palpitations No dizziness or syncope Some right leg swelling --relates to the swelling No SOB  Uses the lorazepam at night prn No regular depression or anhedonia Not really anxious  Current Outpatient Prescriptions on File Prior to Visit  Medication Sig Dispense Refill  . amLODipine (NORVASC) 10 MG tablet TAKE 1 TABLET BY MOUTH EVERY DAY 90 tablet 2  . aspirin EC 81 MG tablet Take 81 mg by mouth daily.    Marland Kitchen ketoconazole (NIZORAL) 2 % cream APPLY TOPICALLY 2 (TWO) TIMES DAILY AS NEEDED FOR IRRITATION. 60 g 1  . lisinopril-hydrochlorothiazide (PRINZIDE,ZESTORETIC) 20-12.5 MG per tablet TAKE 2 TABLETS BY MOUTH EVERY DAY 180 tablet 2  . LORazepam (ATIVAN) 1 MG tablet TAKE 1 TO 2 TABLETS BY MOUTH AT BEDTIME AS NEEDED 60 tablet 0  . meloxicam (MOBIC) 7.5 MG tablet Take 1 tablet (7.5 mg total) by mouth 2 (two) times daily as needed for pain. 60 tablet 3  . pravastatin (PRAVACHOL) 20 MG tablet TAKE 1 TABLET BY MOUTH EVERY DAY 90 tablet 1   No current facility-administered medications on file prior to visit.     Allergies  Allergen Reactions  . Metformin     REACTION: diarrhea    Past Medical History:  Diagnosis Date  . Allergy   . BPH (benign prostatic hypertrophy)   . Diabetes mellitus   . ED (erectile  dysfunction)   . History of prostate cancer   . Hx of adenomatous colonic polyps   . Hyperlipidemia   . Hypertension   . Osteoarthritis     Past Surgical History:  Procedure Laterality Date  . APPENDECTOMY  1956  . FOOT FRACTURE SURGERY  1996  . KNEE ARTHROSCOPY  11/2005   right knee  . PROSTATE SURGERY  01/2009   I-131 implants  . SINUS SURGERY WITH INSTATRAK  09/2007    No family history on file.  Social History   Social History  . Marital status: Married    Spouse name: N/A  . Number of children: 2  . Years of education: N/A   Occupational History  . retired Retired    Armed forces logistics/support/administrative officer   Social History Main Topics  . Smoking status: Former Smoker    Quit date: 04/03/1982  . Smokeless tobacco: Never Used  . Alcohol use No  . Drug use: Unknown  . Sexual activity: Not on file   Other Topics Concern  . Not on file   Social History Narrative   No living will   Wife and daughter should be health care POAs   Discussed DNR--he requests this (done 10/14/12)   No tube feedings if cognitively unaware   Review of Systems Appetite is good Weight is stable Sleeps okay in general Full upper and lower dentures--mostly just wears upper. No mouth  sores Wears seat belt Bowels are okay. No blood in stool Voids okay. Flow is okay No skin problems-- no suspicious lesions Arthritis isolated in knee    Objective:   Physical Exam  Constitutional: He is oriented to person, place, and time. He appears well-developed and well-nourished. No distress.  HENT:  Mouth/Throat: Oropharynx is clear and moist. No oropharyngeal exudate.  Neck: Normal range of motion. Neck supple. No thyromegaly present.  Cardiovascular: Normal rate, regular rhythm, normal heart sounds and intact distal pulses.  Exam reveals no gallop.   No murmur heard. Pulmonary/Chest: Effort normal and breath sounds normal. No respiratory distress. He has no wheezes. He has no rales.  Abdominal: Soft. There  is no tenderness.  Musculoskeletal: He exhibits no edema or tenderness.  Lymphadenopathy:    He has no cervical adenopathy.  Neurological: He is alert and oriented to person, place, and time.  President--- "Trump, Obama, Clinton---Bush" 100-93-? D-l-o-r-w Recall 1/3  Normal sensation in feet  Skin: No rash noted. No erythema.  No foot lesions  Psychiatric: He has a normal mood and affect. His behavior is normal.          Assessment & Plan:

## 2016-02-10 NOTE — Assessment & Plan Note (Signed)
No problems with statin 

## 2016-02-11 LAB — CBC WITH DIFFERENTIAL/PLATELET
BASOS ABS: 0 10*3/uL (ref 0.0–0.1)
Basophils Relative: 0.4 % (ref 0.0–3.0)
EOS ABS: 0.2 10*3/uL (ref 0.0–0.7)
EOS PCT: 3.5 % (ref 0.0–5.0)
HCT: 41.9 % (ref 39.0–52.0)
HEMOGLOBIN: 14.4 g/dL (ref 13.0–17.0)
LYMPHS ABS: 1.9 10*3/uL (ref 0.7–4.0)
Lymphocytes Relative: 45.4 % (ref 12.0–46.0)
MCHC: 34.4 g/dL (ref 30.0–36.0)
MCV: 96.7 fl (ref 78.0–100.0)
MONO ABS: 0.4 10*3/uL (ref 0.1–1.0)
Monocytes Relative: 9 % (ref 3.0–12.0)
NEUTROS PCT: 41.7 % — AB (ref 43.0–77.0)
Neutro Abs: 1.8 10*3/uL (ref 1.4–7.7)
Platelets: 225 10*3/uL (ref 150.0–400.0)
RBC: 4.34 Mil/uL (ref 4.22–5.81)
RDW: 14.5 % (ref 11.5–15.5)
WBC: 4.3 10*3/uL (ref 4.0–10.5)

## 2016-02-11 LAB — COMPREHENSIVE METABOLIC PANEL
ALBUMIN: 4.3 g/dL (ref 3.5–5.2)
ALK PHOS: 70 U/L (ref 39–117)
ALT: 28 U/L (ref 0–53)
AST: 34 U/L (ref 0–37)
BILIRUBIN TOTAL: 1 mg/dL (ref 0.2–1.2)
BUN: 17 mg/dL (ref 6–23)
CO2: 31 mEq/L (ref 19–32)
CREATININE: 0.97 mg/dL (ref 0.40–1.50)
Calcium: 9.4 mg/dL (ref 8.4–10.5)
Chloride: 100 mEq/L (ref 96–112)
GFR: 96.48 mL/min (ref >60.00)
GLUCOSE: 117 mg/dL — AB (ref 70–99)
POTASSIUM: 3.5 meq/L (ref 3.5–5.1)
SODIUM: 139 meq/L (ref 135–145)
TOTAL PROTEIN: 7.2 g/dL (ref 6.0–8.3)

## 2016-02-11 LAB — LIPID PANEL
CHOLESTEROL: 162 mg/dL (ref 0–200)
HDL: 56.6 mg/dL (ref >39.00)
LDL CALC: 86 mg/dL (ref 0–99)
NonHDL: 104.99
TRIGLYCERIDES: 96 mg/dL (ref 0.0–149.0)
Total CHOL/HDL Ratio: 3
VLDL: 19.2 mg/dL (ref 0.0–40.0)

## 2016-02-11 LAB — HEMOGLOBIN A1C: HEMOGLOBIN A1C: 6.3 % (ref 4.6–6.5)

## 2016-02-15 ENCOUNTER — Encounter: Payer: Self-pay | Admitting: Internal Medicine

## 2016-02-23 ENCOUNTER — Encounter
Admission: RE | Admit: 2016-02-23 | Discharge: 2016-02-23 | Disposition: A | Discharge status: Home / Self Care | Payer: Medicare Other | Source: Ambulatory Visit | Attending: Orthopedic Surgery | Admitting: Orthopedic Surgery

## 2016-02-23 DIAGNOSIS — Z01818 Encounter for other preprocedural examination: Secondary | ICD-10-CM | POA: Diagnosis not present

## 2016-02-23 DIAGNOSIS — Z0183 Encounter for blood typing: Secondary | ICD-10-CM | POA: Insufficient documentation

## 2016-02-23 DIAGNOSIS — M1711 Unilateral primary osteoarthritis, right knee: Secondary | ICD-10-CM | POA: Insufficient documentation

## 2016-02-23 DIAGNOSIS — Z01812 Encounter for preprocedural laboratory examination: Secondary | ICD-10-CM | POA: Diagnosis not present

## 2016-02-23 DIAGNOSIS — I10 Essential (primary) hypertension: Secondary | ICD-10-CM | POA: Insufficient documentation

## 2016-02-23 LAB — TYPE AND SCREEN
ABO/RH(D): A POS
Antibody Screen: NEGATIVE

## 2016-02-23 LAB — CBC
HEMATOCRIT: 40.2 % (ref 40.0–52.0)
Hemoglobin: 13.9 g/dL (ref 13.0–18.0)
MCH: 33.3 pg (ref 26.0–34.0)
MCHC: 34.6 g/dL (ref 32.0–36.0)
MCV: 96.2 fL (ref 80.0–100.0)
Platelets: 159 10*3/uL (ref 150–440)
RBC: 4.18 MIL/uL — ABNORMAL LOW (ref 4.40–5.90)
RDW: 14.3 % (ref 11.5–14.5)
WBC: 3.9 10*3/uL (ref 3.8–10.6)

## 2016-02-23 LAB — URINALYSIS COMPLETE WITH MICROSCOPIC (ARMC ONLY)
Bacteria, UA: NONE SEEN
Bilirubin Urine: NEGATIVE
Glucose, UA: 150 mg/dL — AB
HGB URINE DIPSTICK: NEGATIVE
KETONES UR: NEGATIVE mg/dL
Leukocytes, UA: NEGATIVE
NITRITE: NEGATIVE
PH: 6 (ref 5.0–8.0)
PROTEIN: NEGATIVE mg/dL
SPECIFIC GRAVITY, URINE: 1.021 (ref 1.005–1.030)

## 2016-02-23 LAB — COMPREHENSIVE METABOLIC PANEL
ALBUMIN: 4.1 g/dL (ref 3.5–5.0)
ALK PHOS: 64 U/L (ref 38–126)
ALT: 26 U/L (ref 17–63)
ANION GAP: 8 (ref 5–15)
AST: 42 U/L — ABNORMAL HIGH (ref 15–41)
BILIRUBIN TOTAL: 1.3 mg/dL — AB (ref 0.3–1.2)
BUN: 17 mg/dL (ref 6–20)
CALCIUM: 8.8 mg/dL — AB (ref 8.9–10.3)
CO2: 26 mmol/L (ref 22–32)
Chloride: 105 mmol/L (ref 101–111)
Creatinine, Ser: 0.84 mg/dL (ref 0.61–1.24)
GFR calc Af Amer: >60 mL/min (ref >60)
GLUCOSE: 166 mg/dL — AB (ref 65–99)
Potassium: 3.2 mmol/L — ABNORMAL LOW (ref 3.5–5.1)
Sodium: 139 mmol/L (ref 135–145)
TOTAL PROTEIN: 7.1 g/dL (ref 6.5–8.1)

## 2016-02-23 LAB — SEDIMENTATION RATE: SED RATE: 10 mm/h (ref 0–20)

## 2016-02-23 LAB — C-REACTIVE PROTEIN

## 2016-02-23 LAB — SURGICAL PCR SCREEN
MRSA, PCR: NEGATIVE
Staphylococcus aureus: POSITIVE — AB

## 2016-02-23 LAB — PROTIME-INR
INR: 1.05
Prothrombin Time: 13.7 seconds (ref 11.4–15.2)

## 2016-02-23 LAB — APTT: APTT: 33 s (ref 24–36)

## 2016-02-23 MED ORDER — CEFAZOLIN SODIUM-DEXTROSE 2-4 GM/100ML-% IV SOLN: 2.0000 g | INTRAVENOUS | Status: DC

## 2016-02-23 MED ORDER — DEXTROSE 5 % IV SOLN
INTRAVENOUS | Status: DC
  Filled 2016-02-23: qty 20

## 2016-02-23 NOTE — Patient Instructions (Signed)
Your procedure is scheduled on: Monday 03/06/16 Report to [place]. 2ND FLOOR MEDICAL MALL ENTRANCE. To find out your arrival time please call [phone] between 1PM - 3PM on Friday 03/03/16.  Remember: Instructions that are not followed completely may result in serious medical risk, up to and including death, or upon the discretion of your surgeon and anesthesiologist your surgery may need to be rescheduled.    __X__ 1. Do not eat food or drink liquids after midnight. No gum chewing or hard candies.     __X__ 2. No Alcohol for 24 hours before or after surgery.   ____ 3. Bring all medications with you on the day of surgery if instructed.    __X__ 4. Notify your doctor if there is any change in your medical condition     (cold, fever, infections).             ___X__5. No smoking within 24 hours of your surgery.     Do not wear jewelry, make-up, hairpins, clips or nail polish.  Do not wear lotions, powders, or perfumes.   Do not shave 48 hours prior to surgery. Men may shave face and neck.  Do not bring valuables to the hospital.    [facility] is not responsible for any belongings or valuables.               Contacts, dentures or bridgework may not be worn into surgery.  Leave your suitcase in the car. After surgery it may be brought to your room.  For patients admitted to the hospital, discharge time is determined by your                treatment team.   Patients discharged the day of surgery will not be allowed to drive home.   Please read over the following fact sheets that you were given:   Pain Booklet and MRSA Information   __X__ Take these medicines the morning of surgery with A SIP OF WATER:    1. AMLODIPINE  2.   3.   4.  5.  6.  ____ Fleet Enema (as directed)   __X__ Use CHG Soap as directed  ____ Use inhalers on the day of surgery  ____ Stop metformin 2 days prior to surgery    ____ Take 1/2 of usual insulin dose the night before surgery and none on the  morning of surgery.   __X__ Stop Coumadin/Plavix/aspirin on 02/28/16  __X__ Stop Anti-inflammatories such as Advil, Aleve, Ibuprofen, Motrin, Naproxen, Naprosyn, Goodies,powder, or aspirin products.  OK to take Tylenol.   ____ Stop supplements until after surgery.    ____ Bring C-Pap to the hospital.

## 2016-02-24 LAB — URINE CULTURE
CULTURE: NO GROWTH
Special Requests: NORMAL

## 2016-02-24 LAB — HEMOGLOBIN A1C
Hgb A1c MFr Bld: 6.4 % — ABNORMAL HIGH (ref 4.8–5.6)
MEAN PLASMA GLUCOSE: 137 mg/dL

## 2016-03-02 ENCOUNTER — Other Ambulatory Visit: Payer: Self-pay | Admitting: Internal Medicine

## 2016-03-02 ENCOUNTER — Telehealth: Payer: Self-pay

## 2016-03-02 MED ORDER — POTASSIUM CHLORIDE ER 20 MEQ PO TBCR: 1.0000 | EXTENDED_RELEASE_TABLET | Freq: Every day | ORAL | 1 refills | Status: DC

## 2016-03-02 NOTE — Telephone Encounter (Signed)
Spoke to pt. Rx sent electronically.  

## 2016-03-02 NOTE — Telephone Encounter (Signed)
Approved: #60 x 0 

## 2016-03-02 NOTE — Telephone Encounter (Signed)
Left refill on voice mail at pharmacy  

## 2016-03-02 NOTE — Telephone Encounter (Signed)
Last filled 01-25-16 #60 Last OV 02-10-16 No Future OV

## 2016-03-02 NOTE — Telephone Encounter (Signed)
Pt scheduled for knee surgery on 03/06/16; pt was called from hospital that K is low and hospital was to contact Dr Silvio Pate about low K and med to be sent to [place]. Pt request cb. Under lab tab 02/23/16 pts K was 3.2.

## 2016-03-02 NOTE — Telephone Encounter (Signed)
Okay to send Rx for KCL 50meq daily  #30 x 1

## 2016-03-05 MED ORDER — TRANEXAMIC ACID 1000 MG/10ML IV SOLN
1000.0000 mg | INTRAVENOUS | Status: DC
  Filled 2016-03-05: qty 10

## 2016-03-05 MED ORDER — CEFAZOLIN SODIUM-DEXTROSE 2-4 GM/100ML-% IV SOLN: 2.0000 g | Freq: Once | INTRAVENOUS | Status: DC

## 2016-03-06 ENCOUNTER — Inpatient Hospital Stay: Payer: Medicare Other | Admitting: Certified Registered Nurse Anesthetist

## 2016-03-06 ENCOUNTER — Encounter: Payer: Self-pay | Admitting: Orthopedic Surgery

## 2016-03-06 ENCOUNTER — Inpatient Hospital Stay: Payer: Medicare Other

## 2016-03-06 ENCOUNTER — Encounter
Admission: RE | Disposition: A | Discharge status: Home Health | Payer: Self-pay | Source: Ambulatory Visit | Attending: Orthopedic Surgery

## 2016-03-06 ENCOUNTER — Inpatient Hospital Stay
Admission: RE | Admit: 2016-03-06 | Discharge: 2016-03-08 | DRG: 470 | Disposition: A | Discharge status: Home Health | Payer: Medicare Other | Source: Ambulatory Visit | Attending: Orthopedic Surgery | Admitting: Orthopedic Surgery

## 2016-03-06 DIAGNOSIS — I10 Essential (primary) hypertension: Secondary | ICD-10-CM | POA: Diagnosis present

## 2016-03-06 DIAGNOSIS — Z96659 Presence of unspecified artificial knee joint: Secondary | ICD-10-CM | POA: Diagnosis not present

## 2016-03-06 DIAGNOSIS — Z8546 Personal history of malignant neoplasm of prostate: Secondary | ICD-10-CM | POA: Diagnosis not present

## 2016-03-06 DIAGNOSIS — E119 Type 2 diabetes mellitus without complications: Secondary | ICD-10-CM | POA: Diagnosis not present

## 2016-03-06 DIAGNOSIS — N400 Enlarged prostate without lower urinary tract symptoms: Secondary | ICD-10-CM | POA: Diagnosis present

## 2016-03-06 DIAGNOSIS — Z86010 Personal history of colonic polyps: Secondary | ICD-10-CM | POA: Diagnosis not present

## 2016-03-06 DIAGNOSIS — Z471 Aftercare following joint replacement surgery: Secondary | ICD-10-CM | POA: Diagnosis not present

## 2016-03-06 DIAGNOSIS — Z79899 Other long term (current) drug therapy: Secondary | ICD-10-CM | POA: Diagnosis not present

## 2016-03-06 DIAGNOSIS — M1711 Unilateral primary osteoarthritis, right knee: Principal | ICD-10-CM | POA: Diagnosis present

## 2016-03-06 DIAGNOSIS — E785 Hyperlipidemia, unspecified: Secondary | ICD-10-CM | POA: Diagnosis present

## 2016-03-06 DIAGNOSIS — Z972 Presence of dental prosthetic device (complete) (partial): Secondary | ICD-10-CM | POA: Diagnosis not present

## 2016-03-06 DIAGNOSIS — M25561 Pain in right knee: Secondary | ICD-10-CM | POA: Diagnosis not present

## 2016-03-06 DIAGNOSIS — R262 Difficulty in walking, not elsewhere classified: Secondary | ICD-10-CM

## 2016-03-06 DIAGNOSIS — Z7982 Long term (current) use of aspirin: Secondary | ICD-10-CM

## 2016-03-06 DIAGNOSIS — Z87891 Personal history of nicotine dependence: Secondary | ICD-10-CM

## 2016-03-06 DIAGNOSIS — Z96651 Presence of right artificial knee joint: Secondary | ICD-10-CM | POA: Diagnosis not present

## 2016-03-06 DIAGNOSIS — M6281 Muscle weakness (generalized): Secondary | ICD-10-CM

## 2016-03-06 DIAGNOSIS — Z886 Allergy status to analgesic agent status: Secondary | ICD-10-CM

## 2016-03-06 HISTORY — PX: KNEE ARTHROPLASTY: SHX992

## 2016-03-06 LAB — POCT I-STAT 4, (NA,K, GLUC, HGB,HCT)
GLUCOSE: 115 mg/dL — AB (ref 65–99)
HCT: 42 % (ref 39.0–52.0)
Hemoglobin: 14.3 g/dL (ref 13.0–17.0)
POTASSIUM: 3.9 mmol/L (ref 3.5–5.1)
SODIUM: 139 mmol/L (ref 135–145)

## 2016-03-06 LAB — GLUCOSE, CAPILLARY
GLUCOSE-CAPILLARY: 101 mg/dL — AB (ref 65–99)
GLUCOSE-CAPILLARY: 99 mg/dL (ref 65–99)
Glucose-Capillary: 100 mg/dL — ABNORMAL HIGH (ref 65–99)
Glucose-Capillary: 89 mg/dL (ref 65–99)

## 2016-03-06 LAB — ABO/RH: ABO/RH(D): A POS

## 2016-03-06 SURGERY — ARTHROPLASTY, KNEE, TOTAL, USING IMAGELESS COMPUTER-ASSISTED NAVIGATION
Anesthesia: Spinal | Site: Knee | Laterality: Right | Wound class: Clean

## 2016-03-06 MED ORDER — PHENOL 1.4 % MT LIQD
1.0000 | OROMUCOSAL | Status: DC | PRN
  Administered 2016-03-08: 1 via OROMUCOSAL
  Filled 2016-03-06 (×2): qty 177

## 2016-03-06 MED ORDER — LORAZEPAM 1 MG PO TABS: 1.0000 mg | ORAL_TABLET | Freq: Every evening | ORAL | Status: DC | PRN

## 2016-03-06 MED ORDER — NEOMYCIN-POLYMYXIN B GU 40-200000 IR SOLN
Status: AC
  Filled 2016-03-06: qty 20

## 2016-03-06 MED ORDER — TETRACAINE HCL 1 % IJ SOLN
INTRAMUSCULAR | Status: AC
  Filled 2016-03-06: qty 2

## 2016-03-06 MED ORDER — POTASSIUM CHLORIDE CRYS ER 20 MEQ PO TBCR
20.0000 meq | EXTENDED_RELEASE_TABLET | Freq: Every day | ORAL | Status: DC
  Administered 2016-03-06 – 2016-03-07 (×2): 20 meq via ORAL
  Filled 2016-03-06 (×3): qty 1

## 2016-03-06 MED ORDER — DIPHENHYDRAMINE HCL 12.5 MG/5ML PO ELIX: 12.5000 mg | ORAL_SOLUTION | ORAL | Status: DC | PRN

## 2016-03-06 MED ORDER — CEFAZOLIN SODIUM-DEXTROSE 2-4 GM/100ML-% IV SOLN
2.0000 g | Freq: Four times a day (QID) | INTRAVENOUS | Status: AC
  Administered 2016-03-06 – 2016-03-07 (×4): 2 g via INTRAVENOUS
  Filled 2016-03-06 (×6): qty 100

## 2016-03-06 MED ORDER — PROPOFOL 500 MG/50ML IV EMUL
INTRAVENOUS | Status: DC | PRN
  Administered 2016-03-06: 100 ug/kg/min via INTRAVENOUS

## 2016-03-06 MED ORDER — ACETAMINOPHEN 10 MG/ML IV SOLN
1000.0000 mg | Freq: Four times a day (QID) | INTRAVENOUS | Status: AC
  Administered 2016-03-06 – 2016-03-07 (×4): 1000 mg via INTRAVENOUS
  Filled 2016-03-06 (×4): qty 100

## 2016-03-06 MED ORDER — TRANEXAMIC ACID 1000 MG/10ML IV SOLN
1000.0000 mg | Freq: Once | INTRAVENOUS | Status: AC
  Administered 2016-03-06: 1000 mg via INTRAVENOUS
  Filled 2016-03-06: qty 10

## 2016-03-06 MED ORDER — FENTANYL CITRATE (PF) 100 MCG/2ML IJ SOLN: 25.0000 ug | INTRAMUSCULAR | Status: DC | PRN

## 2016-03-06 MED ORDER — ACETAMINOPHEN 650 MG RE SUPP: 650.0000 mg | Freq: Four times a day (QID) | RECTAL | Status: DC | PRN

## 2016-03-06 MED ORDER — ACETAMINOPHEN 10 MG/ML IV SOLN
INTRAVENOUS | Status: AC
  Filled 2016-03-06: qty 100

## 2016-03-06 MED ORDER — CELECOXIB 200 MG PO CAPS
200.0000 mg | ORAL_CAPSULE | Freq: Two times a day (BID) | ORAL | Status: DC
  Administered 2016-03-06 – 2016-03-07 (×4): 200 mg via ORAL
  Filled 2016-03-06 (×5): qty 1

## 2016-03-06 MED ORDER — FLEET ENEMA 7-19 GM/118ML RE ENEM: 1.0000 | ENEMA | Freq: Once | RECTAL | Status: DC | PRN

## 2016-03-06 MED ORDER — MIDAZOLAM HCL 5 MG/5ML IJ SOLN
INTRAMUSCULAR | Status: DC | PRN
  Administered 2016-03-06 (×2): 1 mg via INTRAVENOUS

## 2016-03-06 MED ORDER — CEFAZOLIN SODIUM-DEXTROSE 2-3 GM-% IV SOLR
INTRAVENOUS | Status: DC | PRN
  Administered 2016-03-06: 2 g via INTRAVENOUS

## 2016-03-06 MED ORDER — LIDOCAINE HCL (CARDIAC) 20 MG/ML IV SOLN
INTRAVENOUS | Status: DC | PRN
  Administered 2016-03-06: 50 mg via INTRAVENOUS

## 2016-03-06 MED ORDER — ONDANSETRON HCL 4 MG/2ML IJ SOLN: 4.0000 mg | Freq: Once | INTRAMUSCULAR | Status: DC | PRN

## 2016-03-06 MED ORDER — SODIUM CHLORIDE 0.9 % IV SOLN
INTRAVENOUS | Status: DC
  Administered 2016-03-06 – 2016-03-07 (×3): via INTRAVENOUS

## 2016-03-06 MED ORDER — SODIUM CHLORIDE 0.9 % IJ SOLN
INTRAMUSCULAR | Status: AC
  Filled 2016-03-06: qty 50

## 2016-03-06 MED ORDER — BUPIVACAINE LIPOSOME 1.3 % IJ SUSP
INTRAMUSCULAR | Status: AC
  Filled 2016-03-06: qty 20

## 2016-03-06 MED ORDER — ONDANSETRON HCL 4 MG/2ML IJ SOLN
INTRAMUSCULAR | Status: DC | PRN
  Administered 2016-03-06: 4 mg via INTRAVENOUS

## 2016-03-06 MED ORDER — PANTOPRAZOLE SODIUM 40 MG PO TBEC
40.0000 mg | DELAYED_RELEASE_TABLET | Freq: Two times a day (BID) | ORAL | Status: DC
  Administered 2016-03-06 – 2016-03-07 (×3): 40 mg via ORAL
  Filled 2016-03-06 (×4): qty 1

## 2016-03-06 MED ORDER — SENNOSIDES-DOCUSATE SODIUM 8.6-50 MG PO TABS
1.0000 | ORAL_TABLET | Freq: Two times a day (BID) | ORAL | Status: DC
  Administered 2016-03-06 – 2016-03-07 (×4): 1 via ORAL
  Filled 2016-03-06 (×5): qty 1

## 2016-03-06 MED ORDER — LISINOPRIL 20 MG PO TABS
40.0000 mg | ORAL_TABLET | Freq: Every day | ORAL | Status: DC
  Administered 2016-03-06 – 2016-03-08 (×3): 40 mg via ORAL
  Filled 2016-03-06 (×3): qty 2

## 2016-03-06 MED ORDER — CEFAZOLIN SODIUM-DEXTROSE 2-4 GM/100ML-% IV SOLN
INTRAVENOUS | Status: AC
  Filled 2016-03-06: qty 100

## 2016-03-06 MED ORDER — ONDANSETRON HCL 4 MG/2ML IJ SOLN: 4.0000 mg | Freq: Four times a day (QID) | INTRAMUSCULAR | Status: DC | PRN

## 2016-03-06 MED ORDER — PROPOFOL 10 MG/ML IV BOLUS
INTRAVENOUS | Status: DC | PRN
Start: 2016-03-06 — End: 2016-03-06
  Administered 2016-03-06: 30 mg via INTRAVENOUS

## 2016-03-06 MED ORDER — TRANEXAMIC ACID 1000 MG/10ML IV SOLN
INTRAVENOUS | Status: DC | PRN
  Administered 2016-03-06: 1000 mg via INTRAVENOUS

## 2016-03-06 MED ORDER — MORPHINE SULFATE (PF) 2 MG/ML IV SOLN
2.0000 mg | INTRAVENOUS | Status: DC | PRN
Start: 2016-03-06 — End: 2016-03-08
  Administered 2016-03-06: 2 mg via INTRAVENOUS
  Filled 2016-03-06: qty 1

## 2016-03-06 MED ORDER — SODIUM CHLORIDE 0.9 % IV SOLN
INTRAVENOUS | Status: DC
  Administered 2016-03-06 (×2): via INTRAVENOUS

## 2016-03-06 MED ORDER — FAMOTIDINE 20 MG PO TABS
20.0000 mg | ORAL_TABLET | Freq: Once | ORAL | Status: AC
  Administered 2016-03-06: 20 mg via ORAL

## 2016-03-06 MED ORDER — ONDANSETRON HCL 4 MG PO TABS: 4.0000 mg | ORAL_TABLET | Freq: Four times a day (QID) | ORAL | Status: DC | PRN

## 2016-03-06 MED ORDER — FENTANYL CITRATE (PF) 100 MCG/2ML IJ SOLN
INTRAMUSCULAR | Status: DC | PRN
  Administered 2016-03-06 (×2): 50 ug via INTRAVENOUS

## 2016-03-06 MED ORDER — KETAMINE HCL 50 MG/ML IJ SOLN
INTRAMUSCULAR | Status: DC | PRN
  Administered 2016-03-06: 50 mg via INTRAMUSCULAR

## 2016-03-06 MED ORDER — ACETAMINOPHEN 325 MG PO TABS: 650.0000 mg | ORAL_TABLET | Freq: Four times a day (QID) | ORAL | Status: DC | PRN

## 2016-03-06 MED ORDER — BUPIVACAINE HCL (PF) 0.5 % IJ SOLN
INTRAMUSCULAR | Status: DC | PRN
Start: 2016-03-06 — End: 2016-03-06
  Administered 2016-03-06: 2 mL

## 2016-03-06 MED ORDER — TRAMADOL HCL 50 MG PO TABS
50.0000 mg | ORAL_TABLET | ORAL | Status: DC | PRN
  Administered 2016-03-06 – 2016-03-07 (×3): 100 mg via ORAL
  Filled 2016-03-06 (×3): qty 2

## 2016-03-06 MED ORDER — ALUM & MAG HYDROXIDE-SIMETH 200-200-20 MG/5ML PO SUSP: 30.0000 mL | ORAL | Status: DC | PRN

## 2016-03-06 MED ORDER — METOCLOPRAMIDE HCL 10 MG PO TABS
10.0000 mg | ORAL_TABLET | Freq: Three times a day (TID) | ORAL | Status: DC
Start: 2016-03-06 — End: 2016-03-08
  Administered 2016-03-06 – 2016-03-08 (×6): 10 mg via ORAL
  Filled 2016-03-06 (×6): qty 1

## 2016-03-06 MED ORDER — CHLORHEXIDINE GLUCONATE 4 % EX LIQD: 60.0000 mL | Freq: Once | CUTANEOUS | Status: DC

## 2016-03-06 MED ORDER — MAGNESIUM HYDROXIDE 400 MG/5ML PO SUSP
30.0000 mL | Freq: Every day | ORAL | Status: DC | PRN
  Administered 2016-03-07: 30 mL via ORAL
  Filled 2016-03-06: qty 30

## 2016-03-06 MED ORDER — BISACODYL 10 MG RE SUPP
10.0000 mg | Freq: Every day | RECTAL | Status: DC | PRN
  Administered 2016-03-08: 10 mg via RECTAL
  Filled 2016-03-06: qty 1

## 2016-03-06 MED ORDER — FERROUS SULFATE 325 (65 FE) MG PO TABS
325.0000 mg | ORAL_TABLET | Freq: Two times a day (BID) | ORAL | Status: DC
  Administered 2016-03-06 – 2016-03-08 (×4): 325 mg via ORAL
  Filled 2016-03-06 (×4): qty 1

## 2016-03-06 MED ORDER — HYDROCHLOROTHIAZIDE 25 MG PO TABS
25.0000 mg | ORAL_TABLET | Freq: Every day | ORAL | Status: DC
  Administered 2016-03-06 – 2016-03-08 (×3): 25 mg via ORAL
  Filled 2016-03-06 (×3): qty 1

## 2016-03-06 MED ORDER — BUPIVACAINE HCL (PF) 0.25 % IJ SOLN
INTRAMUSCULAR | Status: DC | PRN
  Administered 2016-03-06: 60 mL

## 2016-03-06 MED ORDER — GLYCOPYRROLATE 0.2 MG/ML IJ SOLN
0.2000 mg | Freq: Once | INTRAMUSCULAR | Status: AC
  Administered 2016-03-06: 0.2 mg via INTRAVENOUS

## 2016-03-06 MED ORDER — INSULIN ASPART 100 UNIT/ML ~~LOC~~ SOLN
0.0000 [IU] | Freq: Three times a day (TID) | SUBCUTANEOUS | Status: DC
  Administered 2016-03-07: 2 [IU] via SUBCUTANEOUS
  Filled 2016-03-06: qty 2

## 2016-03-06 MED ORDER — SODIUM CHLORIDE FLUSH 0.9 % IV SOLN
INTRAVENOUS | Status: AC
  Filled 2016-03-06: qty 10

## 2016-03-06 MED ORDER — BUPIVACAINE HCL (PF) 0.25 % IJ SOLN
INTRAMUSCULAR | Status: AC
  Filled 2016-03-06: qty 30

## 2016-03-06 MED ORDER — ENOXAPARIN SODIUM 30 MG/0.3ML ~~LOC~~ SOLN
30.0000 mg | Freq: Two times a day (BID) | SUBCUTANEOUS | Status: DC
  Administered 2016-03-07 – 2016-03-08 (×3): 30 mg via SUBCUTANEOUS
  Filled 2016-03-06 (×3): qty 0.3

## 2016-03-06 MED ORDER — OXYCODONE HCL 5 MG PO TABS
5.0000 mg | ORAL_TABLET | ORAL | Status: DC | PRN
  Administered 2016-03-06 (×2): 5 mg via ORAL
  Administered 2016-03-07 (×2): 10 mg via ORAL
  Administered 2016-03-07 (×2): 5 mg via ORAL
  Administered 2016-03-08: 10 mg via ORAL
  Filled 2016-03-06 (×2): qty 1
  Filled 2016-03-06 (×3): qty 2
  Filled 2016-03-06 (×2): qty 1

## 2016-03-06 MED ORDER — FAMOTIDINE 20 MG PO TABS
ORAL_TABLET | ORAL | Status: AC
  Filled 2016-03-06: qty 1

## 2016-03-06 MED ORDER — PHENYLEPHRINE HCL 10 MG/ML IJ SOLN
INTRAMUSCULAR | Status: DC | PRN
  Administered 2016-03-06: 100 ug via INTRAVENOUS

## 2016-03-06 MED ORDER — GLYCOPYRROLATE 0.2 MG/ML IJ SOLN
INTRAMUSCULAR | Status: AC
  Filled 2016-03-06: qty 1

## 2016-03-06 MED ORDER — TETRACAINE HCL 1 % IJ SOLN
INTRAMUSCULAR | Status: DC | PRN
  Administered 2016-03-06: 10 mg via INTRASPINAL

## 2016-03-06 MED ORDER — TRANEXAMIC ACID 1000 MG/10ML IV SOLN: INTRAVENOUS | Status: DC | PRN

## 2016-03-06 MED ORDER — LISINOPRIL-HYDROCHLOROTHIAZIDE 20-12.5 MG PO TABS: 2.0000 | ORAL_TABLET | Freq: Every day | ORAL | Status: DC

## 2016-03-06 MED ORDER — AMLODIPINE BESYLATE 10 MG PO TABS
10.0000 mg | ORAL_TABLET | Freq: Every day | ORAL | Status: DC
  Administered 2016-03-07 – 2016-03-08 (×2): 10 mg via ORAL
  Filled 2016-03-06 (×2): qty 1

## 2016-03-06 MED ORDER — PRAVASTATIN SODIUM 20 MG PO TABS
20.0000 mg | ORAL_TABLET | Freq: Every day | ORAL | Status: DC
  Administered 2016-03-06 – 2016-03-07 (×2): 20 mg via ORAL
  Filled 2016-03-06 (×3): qty 1

## 2016-03-06 MED ORDER — ACETAMINOPHEN 10 MG/ML IV SOLN
INTRAVENOUS | Status: DC | PRN
  Administered 2016-03-06: 1000 mg via INTRAVENOUS

## 2016-03-06 MED ORDER — MENTHOL 3 MG MT LOZG
1.0000 | LOZENGE | OROMUCOSAL | Status: DC | PRN
  Filled 2016-03-06: qty 9

## 2016-03-06 MED ORDER — NEOMYCIN-POLYMYXIN B GU 40-200000 IR SOLN
Status: DC | PRN
  Administered 2016-03-06: 14 mL

## 2016-03-06 MED ORDER — SODIUM CHLORIDE 0.9 % IV SOLN
INTRAVENOUS | Status: DC | PRN
  Administered 2016-03-06: 14:00:00

## 2016-03-06 SURGICAL SUPPLY — 59 items
AUTOTRANSFUS HAS 1/8 (MISCELLANEOUS) ×3
BATTERY INSTRU NAVIGATION (MISCELLANEOUS) ×12 IMPLANT
BLADE SAW 1 (BLADE) ×3 IMPLANT
BLADE SAW 1/2 (BLADE) ×3 IMPLANT
BONE CEMENT GENTAMICIN (Cement) ×6 IMPLANT
CANISTER SUCT 1200ML W/VALVE (MISCELLANEOUS) ×3 IMPLANT
CANISTER SUCT 3000ML (MISCELLANEOUS) ×6 IMPLANT
CAPT KNEE TOTAL 3 ATTUNE ×3 IMPLANT
CATH TRAY METER 16FR LF (MISCELLANEOUS) ×3 IMPLANT
CEMENT BONE GENTAMICIN 40 (Cement) ×2 IMPLANT
COOLER POLAR GLACIER W/PUMP (MISCELLANEOUS) ×3 IMPLANT
CUFF TOURN 24 STER (MISCELLANEOUS) ×3 IMPLANT
CUFF TOURN 30 STER DUAL PORT (MISCELLANEOUS) IMPLANT
DRAPE SHEET LG 3/4 BI-LAMINATE (DRAPES) ×3 IMPLANT
DRSG DERMACEA 8X12 NADH (GAUZE/BANDAGES/DRESSINGS) ×3 IMPLANT
DRSG OPSITE POSTOP 4X14 (GAUZE/BANDAGES/DRESSINGS) ×3 IMPLANT
DRSG TEGADERM 4X4.75 (GAUZE/BANDAGES/DRESSINGS) ×3 IMPLANT
DURAPREP 26ML APPLICATOR (WOUND CARE) ×6 IMPLANT
ELECT CAUTERY BLADE 6.4 (BLADE) ×3 IMPLANT
ELECT REM PT RETURN 9FT ADLT (ELECTROSURGICAL) ×3
ELECTRODE REM PT RTRN 9FT ADLT (ELECTROSURGICAL) ×1 IMPLANT
EX-PIN ORTHOLOCK NAV 4X150 (PIN) ×6 IMPLANT
GLOVE BIOGEL M STRL SZ7.5 (GLOVE) ×6 IMPLANT
GLOVE INDICATOR 8.0 STRL GRN (GLOVE) ×3 IMPLANT
GLOVE SURG 9.0 ORTHO LTXF (GLOVE) ×3 IMPLANT
GLOVE SURG ORTHO 9.0 STRL STRW (GLOVE) ×3 IMPLANT
GOWN STRL REUS W/ TWL LRG LVL3 (GOWN DISPOSABLE) ×2 IMPLANT
GOWN STRL REUS W/TWL 2XL LVL3 (GOWN DISPOSABLE) ×3 IMPLANT
GOWN STRL REUS W/TWL LRG LVL3 (GOWN DISPOSABLE) ×4
HANDPIECE INTERPULSE COAX TIP (DISPOSABLE) ×2
HOLDER FOLEY CATH W/STRAP (MISCELLANEOUS) ×3 IMPLANT
HOOD PEEL AWAY FLYTE STAYCOOL (MISCELLANEOUS) ×6 IMPLANT
KIT RM TURNOVER STRD PROC AR (KITS) ×3 IMPLANT
KNIFE SCULPS 14X20 (INSTRUMENTS) ×3 IMPLANT
LABEL OR SOLS (LABEL) ×3 IMPLANT
NDL SAFETY 18GX1.5 (NEEDLE) ×3 IMPLANT
NEEDLE SPNL 20GX3.5 QUINCKE YW (NEEDLE) ×3 IMPLANT
NS IRRIG 500ML POUR BTL (IV SOLUTION) ×3 IMPLANT
PACK TOTAL KNEE (MISCELLANEOUS) ×3 IMPLANT
PAD WRAPON POLAR KNEE (MISCELLANEOUS) ×1 IMPLANT
PIN FIXATION 1/8DIA X 3INL (PIN) ×3 IMPLANT
SET HNDPC FAN SPRY TIP SCT (DISPOSABLE) ×1 IMPLANT
SOL .9 NS 3000ML IRR  AL (IV SOLUTION) ×2
SOL .9 NS 3000ML IRR UROMATIC (IV SOLUTION) ×1 IMPLANT
SOL PREP PVP 2OZ (MISCELLANEOUS) ×3
SOLUTION PREP PVP 2OZ (MISCELLANEOUS) ×1 IMPLANT
SPONGE DRAIN TRACH 4X4 STRL 2S (GAUZE/BANDAGES/DRESSINGS) ×3 IMPLANT
STAPLER SKIN PROX 35W (STAPLE) ×3 IMPLANT
SUCTION FRAZIER HANDLE 10FR (MISCELLANEOUS) ×2
SUCTION TUBE FRAZIER 10FR DISP (MISCELLANEOUS) ×1 IMPLANT
SUT VIC AB 0 CT1 36 (SUTURE) ×3 IMPLANT
SUT VIC AB 1 CT1 36 (SUTURE) ×6 IMPLANT
SUT VIC AB 2-0 CT2 27 (SUTURE) ×3 IMPLANT
SYR 20CC LL (SYRINGE) ×3 IMPLANT
SYR 30ML LL (SYRINGE) ×6 IMPLANT
SYSTEM AUTOTRANSFUS DUAL TROCR (MISCELLANEOUS) ×1 IMPLANT
TOWEL OR 17X26 4PK STRL BLUE (TOWEL DISPOSABLE) ×3 IMPLANT
TOWER CARTRIDGE SMART MIX (DISPOSABLE) ×3 IMPLANT
WRAPON POLAR PAD KNEE (MISCELLANEOUS) ×3

## 2016-03-06 NOTE — Anesthesia Preprocedure Evaluation (Addendum)
Anesthesia Evaluation  Patient identified by MRN, date of birth, ID band Patient awake    Reviewed: Allergy & Precautions, H&P , NPO status , Patient's Chart, lab work & pertinent test results, reviewed documented beta blocker date and time   History of Anesthesia Complications Negative for: history of anesthetic complications  Airway Mallampati: III  TM Distance: >3 FB Neck ROM: full    Dental  (+) Edentulous Upper, Upper Dentures, Partial Lower, Missing   Pulmonary neg shortness of breath, neg sleep apnea, neg COPD, neg recent URI, former smoker,           Cardiovascular Exercise Tolerance: Good hypertension, (-) angina(-) CAD, (-) Past MI, (-) Cardiac Stents and (-) CABG (-) dysrhythmias (-) Valvular Problems/Murmurs     Neuro/Psych negative neurological ROS  negative psych ROS   GI/Hepatic negative GI ROS, Neg liver ROS,   Endo/Other  diabetes  Renal/GU negative Renal ROS  negative genitourinary   Musculoskeletal   Abdominal   Peds  Hematology negative hematology ROS (+)   Anesthesia Other Findings Past Medical History: No date: Allergy No date: BPH (benign prostatic hypertrophy) No date: Diabetes mellitus No date: ED (erectile dysfunction) No date: History of prostate cancer No date: Hx of adenomatous colonic polyps No date: Hyperlipidemia No date: Hypertension No date: Osteoarthritis   Reproductive/Obstetrics negative OB ROS                            Anesthesia Physical Anesthesia Plan  ASA: II  Anesthesia Plan: Spinal   Post-op Pain Management:    Induction:   Airway Management Planned:   Additional Equipment:   Intra-op Plan:   Post-operative Plan:   Informed Consent: I have reviewed the patients History and Physical, chart, labs and discussed the procedure including the risks, benefits and alternatives for the proposed anesthesia with the patient or authorized  representative who has indicated his/her understanding and acceptance.   Dental Advisory Given  Plan Discussed with: Anesthesiologist, CRNA and Surgeon  Anesthesia Plan Comments:         Anesthesia Quick Evaluation

## 2016-03-06 NOTE — H&P (Signed)
The patient has been re-examined, and the chart reviewed, and there have been no interval changes to the documented history and physical.    The risks, benefits, and alternatives have been discussed at length. The patient expressed understanding of the risks benefits and agreed with plans for surgical intervention.  James P. Hooten, Jr. M.D.    

## 2016-03-06 NOTE — NC FL2 (Signed)
[name] LEVEL OF CARE SCREENING TOOL     IDENTIFICATION  Patient Name: Rodney Suarez Birthdate: 10-02-1940 Sex: male Admission Date (Current Location): 03/06/2016  [place] and [place] Number:  Engineering geologist and Address:  [facility], [address] [place], [place] [address]      Provider Number: [id number]  Attending Physician Name and Address:  Dereck Leep, MD  Relative Name and Phone Number:       Current Level of Care: Hospital Recommended Level of Care: [place] Prior Approval Number:    Date Approved/Denied:   PASRR Number:  ([id number])  Discharge Plan: SNF    Current Diagnoses: Patient Active Problem List   Diagnosis Date Noted  . S/P total knee arthroplasty 03/06/2016  . Advance directive discussed with patient 02/10/2016  . Primary osteoarthritis of right knee 12/31/2015  . Routine general medical examination at a health care facility 12/28/2010  . OSTEOARTHRITIS 12/22/2009  . PROSTATE CANCER, HX OF 12/22/2008  . Hyperlipemia 10/08/2007  . BENIGN PROSTATIC HYPERTROPHY 10/08/2007  . ADENOMATOUS COLONIC POLYP 03/07/2007  . Diabetes mellitus type 2, controlled, without complications ([place]) [id number]  . Essential hypertension, benign 03/07/2007  . ALLERGIC RHINITIS 03/07/2007    Orientation RESPIRATION BLADDER Height & Weight     Self, Time, Situation, Place  Normal Continent Weight: 210 lb (95.3 kg) Height:  6\' 1"  (185.4 cm)  BEHAVIORAL SYMPTOMS/MOOD NEUROLOGICAL BOWEL NUTRITION STATUS   (none)  (none) Continent Diet (Regular Diet )  AMBULATORY STATUS COMMUNICATION OF NEEDS Skin   Extensive Assist Verbally Surgical wounds (Incision: Right Knee )                       Personal Care Assistance Level of Assistance  Bathing, Feeding, Dressing Bathing Assistance: Limited assistance Feeding assistance: Independent Dressing Assistance: Limited assistance     Functional Limitations  Info  Sight, Hearing, Speech Sight Info: Adequate Hearing Info: Adequate Speech Info: Adequate    SPECIAL CARE FACTORS FREQUENCY  PT (By licensed PT), OT (By licensed OT)     PT Frequency:  (5) OT Frequency:  (5)            Contractures      Additional Factors Info  Code Status, Allergies Code Status Info:  (Full Code. ) Allergies Info:  (Meloxicam, Metformin)           Current Medications (03/06/2016):  This is the current hospital active medication list Current Facility-Administered Medications  Medication Dose Route Frequency Provider Last Rate Last Dose  . 0.9 %  sodium chloride infusion   Intravenous Continuous Dereck Leep, MD 100 mL/hr at 03/06/16 1602    . acetaminophen (OFIRMEV) IV 1,000 mg  1,000 mg Intravenous Q6H Dereck Leep, MD      . acetaminophen (TYLENOL) tablet 650 mg  650 mg Oral Q6H PRN Dereck Leep, MD       Or  . acetaminophen (TYLENOL) suppository 650 mg  650 mg Rectal Q6H PRN Dereck Leep, MD      . alum & mag hydroxide-simeth (MAALOX/MYLANTA) 200-200-20 MG/5ML suspension 30 mL  30 mL Oral Q4H PRN Dereck Leep, MD      . amLODipine (NORVASC) tablet 10 mg  10 mg Oral Daily Dereck Leep, MD      . bisacodyl (DULCOLAX) suppository 10 mg  10 mg Rectal Daily PRN Dereck Leep, MD      . ceFAZolin (ANCEF) 2-4  GM/100ML-% IVPB           . ceFAZolin (ANCEF) IVPB 2g/100 mL premix  2 g Intravenous Q6H Dereck Leep, MD      . celecoxib (CELEBREX) capsule 200 mg  200 mg Oral Q12H Dereck Leep, MD      . diphenhydrAMINE (BENADRYL) 12.5 MG/5ML elixir 12.5-25 mg  12.5-25 mg Oral Q4H PRN Dereck Leep, MD      . Derrill Memo ON 03/07/2016] enoxaparin (LOVENOX) injection 30 mg  30 mg Subcutaneous Q12H Dereck Leep, MD      . famotidine (PEPCID) 20 MG tablet           . ferrous sulfate tablet 325 mg  325 mg Oral BID WC Dereck Leep, MD      . glycopyrrolate (ROBINUL) 0.2 MG/ML injection           . lisinopril (PRINIVIL,ZESTRIL) tablet 40 mg  40 mg  Oral Daily Dereck Leep, MD       And  . hydrochlorothiazide (HYDRODIURIL) tablet 25 mg  25 mg Oral Daily Dereck Leep, MD      . insulin aspart (novoLOG) injection 0-15 Units  0-15 Units Subcutaneous TID WC Dereck Leep, MD      . LORazepam (ATIVAN) tablet 1 mg  1 mg Oral QHS PRN Dereck Leep, MD      . magnesium hydroxide (MILK OF MAGNESIA) suspension 30 mL  30 mL Oral Daily PRN Dereck Leep, MD      . menthol-cetylpyridinium (CEPACOL) lozenge 3 mg  1 lozenge Oral PRN Dereck Leep, MD       Or  . phenol (CHLORASEPTIC) mouth spray 1 spray  1 spray Mouth/Throat PRN Dereck Leep, MD      . metoCLOPramide (REGLAN) tablet 10 mg  10 mg Oral TID AC & HS Dereck Leep, MD      . morphine 2 MG/ML injection 2 mg  2 mg Intravenous Q2H PRN Dereck Leep, MD      . ondansetron (ZOFRAN) tablet 4 mg  4 mg Oral Q6H PRN Dereck Leep, MD       Or  . ondansetron (ZOFRAN) injection 4 mg  4 mg Intravenous Q6H PRN Dereck Leep, MD      . oxyCODONE (Oxy IR/ROXICODONE) immediate release tablet 5-10 mg  5-10 mg Oral Q4H PRN Dereck Leep, MD      . pantoprazole (PROTONIX) EC tablet 40 mg  40 mg Oral BID Dereck Leep, MD      . potassium chloride SA (K-DUR,KLOR-CON) CR tablet 20 mEq  20 mEq Oral Daily Dereck Leep, MD      . pravastatin (PRAVACHOL) tablet 20 mg  20 mg Oral Daily Dereck Leep, MD      . senna-docusate (Senokot-S) tablet 1 tablet  1 tablet Oral BID Dereck Leep, MD      . sodium chloride flush 0.9 % injection           . sodium phosphate (FLEET) 7-19 GM/118ML enema 1 enema  1 enema Rectal Once PRN Dereck Leep, MD      . traMADol Veatrice Bourbon) tablet 50-100 mg  50-100 mg Oral Q4H PRN Dereck Leep, MD         Discharge Medications: Please see discharge summary for a list of discharge medications.  Relevant Imaging Results:  Relevant Lab Results:   Additional Information  (SSN: [id number])  Rodney Suarez, Mel Almond  M, LCSW

## 2016-03-06 NOTE — Progress Notes (Signed)
Pt tolerated getting up out of the bed this evening.

## 2016-03-06 NOTE — Transfer of Care (Signed)
Immediate Anesthesia Transfer of Care Note  Patient: Rodney Suarez  Procedure(s) Performed: Procedure(s): COMPUTER ASSISTED TOTAL KNEE ARTHROPLASTY (Right)  Patient Location: PACU  Anesthesia Type:Spinal  Level of Consciousness: sedated  Airway & Oxygen Therapy: Patient Spontanous Breathing and Patient connected to face mask oxygen  Post-op Assessment: Report given to RN and Post -op Vital signs reviewed and stable  Post vital signs: Reviewed and stable  Last Vitals:  Vitals:   03/06/16 0905 03/06/16 1432  BP: (!) 154/89   Pulse: 85   Resp: 18   Temp: 37.2 C (!) (P) 36.1 C    Last Pain:  Vitals:   03/06/16 0905  TempSrc: Oral  PainSc: 6          Complications: No apparent anesthesia complications

## 2016-03-06 NOTE — Brief Op Note (Signed)
03/06/2016  2:34 PM  PATIENT:  Rodney Suarez  75 y.o. male  PRE-OPERATIVE DIAGNOSIS:  PRIMARY OSTEOARTHRITIS RIGHT KNEE  POST-OPERATIVE DIAGNOSIS:  PRIMARY OSTEOARTHRITIS RIGHT KNEE  PROCEDURE:  Procedure(s): COMPUTER ASSISTED TOTAL KNEE ARTHROPLASTY (Right)  SURGEON:  Surgeon(s) and Role:    * Dereck Leep, MD - Primary  ASSISTANTS: Vance Peper, PA   ANESTHESIA:   spinal  EBL:  Total I/O In: 1000 [I.V.:1000] Out: 300 [Urine:250; Blood:50]  BLOOD ADMINISTERED:none  DRAINS: 2 medium drains to a reinfusion system   LOCAL MEDICATIONS USED:  MARCAINE    and OTHER Exparel  SPECIMEN:  Source of Specimen:  Bony fragments from the distal femur and proximal tibia  DISPOSITION OF SPECIMEN:  PATHOLOGY  COUNTS:  YES  TOURNIQUET:   104 minutes  DICTATION: .Dragon Dictation  PLAN OF CARE: Admit to inpatient   PATIENT DISPOSITION:  PACU - hemodynamically stable.   Delay start of Pharmacological VTE agent (>24hrs) due to surgical blood loss or risk of bleeding: yes

## 2016-03-06 NOTE — Progress Notes (Signed)
Pt's autovac drained 118mls total after four hours, converted at this time to hemovac. Pt has had multiple cns checks, is now able to move his toes and states he is beginning to feel the numbness to his feet and legs. Pt has already received tramadol in anticipation of spinal wearing off. Pt ate dinner and had no complaints of nausea. tp resting in bed with bone foam in place, in no distress, ivf continues with site free of redness and swelling. Call bell in reach.

## 2016-03-06 NOTE — Op Note (Signed)
OPERATIVE NOTE  DATE OF SURGERY:  03/06/2016  PATIENT NAME:  Travarius Bacher   DOB: [birth year]  MRN: [id number]  PRE-OPERATIVE DIAGNOSIS: Degenerative arthrosis of the right knee, primary  POST-OPERATIVE DIAGNOSIS:  Same  PROCEDURE:  Right total knee arthroplasty using computer-assisted navigation  SURGEON:  Marciano Sequin. M.D.  ASSISTANT:  Vance Peper, PA (present and scrubbed throughout the case, critical for assistance with exposure, retraction, instrumentation, and closure)  ANESTHESIA: spinal  ESTIMATED BLOOD LOSS: 50 mL  FLUIDS REPLACED: 1100 mL of crystalloid  TOURNIQUET TIME: 104 minutes  DRAINS: 2 medium drains to a reinfusion system  SOFT TISSUE RELEASES: Anterior cruciate ligament, posterior cruciate ligament, deep medial collateral ligament, patellofemoral ligament  IMPLANTS UTILIZED: DePuy Attune size 6 posterior stabilized femoral component (cemented), size 7 rotating platform tibial component (cemented), 41 mm medialized dome patella (cemented), and a 10 mm stabilized rotating platform polyethylene insert.  INDICATIONS FOR SURGERY: Gladstone Domin is a 75 y.o. year old male with a long history of progressive knee pain. X-rays demonstrated severe degenerative changes in tricompartmental fashion. The patient had not seen any significant improvement despite conservative nonsurgical intervention. After discussion of the risks and benefits of surgical intervention, the patient expressed understanding of the risks benefits and agree with plans for total knee arthroplasty.   The risks, benefits, and alternatives were discussed at length including but not limited to the risks of infection, bleeding, nerve injury, stiffness, blood clots, the need for revision surgery, cardiopulmonary complications, among others, and they were willing to proceed.  PROCEDURE IN DETAIL: The patient was brought into the operating room and, after adequate spinal anesthesia was achieved, a tourniquet  was placed on the patient's upper thigh. The patient's knee and leg were cleaned and prepped with alcohol and DuraPrep and draped in the usual sterile fashion. A "timeout" was performed as per usual protocol. The lower extremity was exsanguinated using an Esmarch, and the tourniquet was inflated to 300 mmHg. An anterior longitudinal incision was made followed by a standard mid vastus approach. The deep fibers of the medial collateral ligament were elevated in a subperiosteal fashion off of the medial flare of the tibia so as to maintain a continuous soft tissue sleeve. The patella was subluxed laterally and the patellofemoral ligament was incised. Inspection of the knee demonstrated severe degenerative changes with full-thickness loss of articular cartilage. Osteophytes were debrided using a rongeur. Anterior and posterior cruciate ligaments were excised. Two 4.0 mm Schanz pins were inserted in the femur and into the tibia for attachment of the array of trackers used for computer-assisted navigation. Hip center was identified using a circumduction technique. Distal landmarks were mapped using the computer. The distal femur and proximal tibia were mapped using the computer. The distal femoral cutting guide was positioned using computer-assisted navigation so as to achieve a 5 distal valgus cut. The femur was sized and it was felt that a size 6 femoral component was appropriate. A size 6 femoral cutting guide was positioned and the anterior cut was performed and verified using the computer. This was followed by completion of the posterior and chamfer cuts. Femoral cutting guide for the central box was then positioned in the center box cut was performed.  Attention was then directed to the proximal tibia. Medial and lateral menisci were excised. The extramedullary tibial cutting guide was positioned using computer-assisted navigation so as to achieve a 0 varus-valgus alignment and 3 posterior slope. The cut was  performed and verified using the computer. The proximal  tibia was sized and it was felt that a size 7 tibial tray was appropriate. Tibial and femoral trials were inserted followed by insertion of a 10 mm polyethylene insert. This allowed for excellent mediolateral soft tissue balancing both in flexion and in full extension. Finally, the patella was cut and prepared so as to accommodate a 41 mm medialized dome patella. A patella trial was placed and the knee was placed through a range of motion with excellent patellar tracking appreciated. The femoral trial was removed after debridement of posterior osteophytes. The central post-hole for the tibial component was reamed followed by insertion of a keel punch. Tibial trials were then removed. Cut surfaces of bone were irrigated with copious amounts of normal saline with antibiotic solution using pulsatile lavage and then suctioned dry. Polymethylmethacrylate cement with gentamicin was prepared in the usual fashion using a vacuum mixer. Cement was applied to the cut surface of the proximal tibia as well as along the undersurface of a size 7 rotating platform tibial component. Tibial component was positioned and impacted into place. Excess cement was removed using [employer] fast streamer. Cement was then applied to the cut surfaces of the femur as well as along the posterior flanges of the size 6 femoral component. The femoral component was positioned and impacted into place. Excess cement was removed using [employer] fast streamer. A 10 mm polyethylene trial was inserted and the knee was brought into full extension with steady axial compression applied. Finally, cement was applied to the backside of a 41 mm medialized dome patella and the patellar component was positioned and patellar clamp applied. Excess cement was removed using [employer] fast streamer. After adequate curing of the cement, the tourniquet was deflated after a total tourniquet time of 104 minutes. Hemostasis was achieved using  electrocautery. The knee was irrigated with copious amounts of normal saline with antibiotic solution using pulsatile lavage and then suctioned dry. 20 mL of 1.3% Exparel and 60 mL of 0.25% Marcaine in 40 mL of normal saline was injected along the posterior capsule, medial and lateral gutters, and along the arthrotomy site. A 10 mm stabilized rotating platform polyethylene insert was inserted and the knee was placed through a range of motion with excellent mediolateral soft tissue balancing appreciated and excellent patellar tracking noted. 2 medium drains were placed in the wound bed and brought out through separate stab incisions to be attached to a reinfusion system. The medial parapatellar portion of the incision was reapproximated using interrupted sutures of #1 Vicryl. Subcutaneous tissue was approximated in layers using first #0 Vicryl followed #2-0 Vicryl. The skin was approximated with skin staples. A sterile dressing was applied.  The patient tolerated the procedure well and was transported to the recovery room in stable condition.    Tery Hoeger P. Holley Bouche., M.D.

## 2016-03-06 NOTE — Progress Notes (Signed)
Pt arrived via bed from PACU at approx 1540, pt is drowsy, had some difficulty staying awake and was repeatedly told that the surgery was finished and that he was fine. Family members at bedside. Pt is alert to self and place when awake, pt is on room air, lungs are clear bilat, Hr is regular, abdomen is soft, bs hypoactive. Foley is draining yellow urine.PIV #20 intact to L arm and R arm, both sites are free of redness and swelling, iv ns infusing at 11mls/hr into l arm. PPP, thigh high ted is intact to L leg, foot pumps on bilat. R knee has dressing in place, polar care in place, and an autovac with approx 35mls bloody drainage noted. Pt had no pain on arrival, able to move his feet slightly but not wiggle his toes. Pt is able to follow commands. Srx2, call bell in reach. Clear liquids at bedside.

## 2016-03-06 NOTE — Anesthesia Procedure Notes (Signed)
Spinal  Patient location during procedure: OR Start time: 03/06/2016 10:59 AM End time: 03/06/2016 11:01 AM Staffing Anesthesiologist: Martha Clan Resident/CRNA: Johnna Acosta Performed: resident/CRNA  Preanesthetic Checklist Completed: patient identified, site marked, surgical consent, pre-op evaluation, timeout performed, IV checked, risks and benefits discussed and monitors and equipment checked Spinal Block Patient position: sitting Prep: ChloraPrep Patient monitoring: heart rate, continuous pulse ox, blood pressure and cardiac monitor Approach: midline Location: L4-5 Injection technique: single-shot Needle Needle type: Whitacre and Introducer  Needle gauge: 24 G Needle length: 9 cm Assessment Sensory level: T10 Additional Notes Negative paresthesia. Negative blood return. Positive free-flowing CSF. Expiration date of kit checked and confirmed. Patient tolerated procedure well, without complications.

## 2016-03-07 ENCOUNTER — Encounter: Payer: Self-pay | Admitting: Orthopedic Surgery

## 2016-03-07 LAB — GLUCOSE, CAPILLARY
Glucose-Capillary: 116 mg/dL — ABNORMAL HIGH (ref 65–99)
Glucose-Capillary: 101 mg/dL — ABNORMAL HIGH (ref 65–99)
Glucose-Capillary: 121 mg/dL — ABNORMAL HIGH (ref 65–99)
Glucose-Capillary: 131 mg/dL — ABNORMAL HIGH (ref 65–99)

## 2016-03-07 LAB — CBC
HEMATOCRIT: 37.7 % — AB (ref 40.0–52.0)
Hemoglobin: 13.3 g/dL (ref 13.0–18.0)
MCH: 34.7 pg — ABNORMAL HIGH (ref 26.0–34.0)
MCHC: 35.4 g/dL (ref 32.0–36.0)
MCV: 98 fL (ref 80.0–100.0)
Platelets: 147 10*3/uL — ABNORMAL LOW (ref 150–440)
RBC: 3.84 MIL/uL — ABNORMAL LOW (ref 4.40–5.90)
RDW: 14.7 % — AB (ref 11.5–14.5)
WBC: 5.7 10*3/uL (ref 3.8–10.6)

## 2016-03-07 LAB — BASIC METABOLIC PANEL
Anion gap: 5 (ref 5–15)
BUN: 12 mg/dL (ref 6–20)
CALCIUM: 8.1 mg/dL — AB (ref 8.9–10.3)
CO2: 24 mmol/L (ref 22–32)
CREATININE: 1.01 mg/dL (ref 0.61–1.24)
Chloride: 106 mmol/L (ref 101–111)
GFR calc Af Amer: >60 mL/min (ref >60)
GFR calc non Af Amer: >60 mL/min (ref >60)
GLUCOSE: 127 mg/dL — AB (ref 65–99)
Potassium: 3.8 mmol/L (ref 3.5–5.1)
Sodium: 135 mmol/L (ref 135–145)

## 2016-03-07 NOTE — Care Management Note (Signed)
Case Management Note  Patient Details  Name: Rodney Suarez MRN: [id number] Date of Birth: Jul 10, 1940  Subjective/Objective:  POD # 1  Right TKA. Met with patient an his wife at bedside to discuss home health and discharge planning. Wife will be patient's caregiver. He has a walker but will need a 3 in 1.h  Pharmacy_ [facility] [address] [phone]. Called Lovenox 40 mg # 14, no refill. Offered choice of home health agency. No preference. Referral to [facility].  PCP is Dr. Silvio Pate.                Action/Plan: Advanced for 3 in 1. [facility]. Lovenox called in Expected Discharge Date:                  Expected Discharge Plan:  [place]  In-House Referral:     Discharge planning Services  CM Consult  Post Acute Care Choice:  [place], Durable Medical Equipment Choice offered to:  Patient, Spouse  DME Arranged:  3-N-1 DME Agency:  [place]:  PT [place]:  [facility] (now [facility])  Status of Service:  In process, will continue to follow  If discussed at Long Length of Stay Meetings, dates discussed:    Additional Comments:  Jolly Mango, RN 03/07/2016, 11:58 AM

## 2016-03-07 NOTE — Discharge Instructions (Signed)

## 2016-03-07 NOTE — Progress Notes (Signed)
Pt has remained alert and oriented this shift. Pain controle with oral pain medications this shift. Tolerated getting up to the chair this evening without difficulty. Foley patent and draining urine. Iv infusing without difficulty. Pt was able to get some sleep in between care.

## 2016-03-07 NOTE — Progress Notes (Signed)
Clinical Social Worker (CSW) received SNF consult. PT is recommending home health. RN case manager is aware of above. Please reconsult if future social work needs arise. CSW signing off.   Zaharah Amir, LCSW [phone] 

## 2016-03-07 NOTE — Progress Notes (Signed)
   Subjective: 1 Day Post-Op Procedure(s) (LRB): COMPUTER ASSISTED TOTAL KNEE ARTHROPLASTY (Right) Patient reports pain as 2 on 0-10 scale.   Patient is well, and has had no acute complaints or problems We will start therapy today.  Plan is to go Home after hospital stay. no nausea and no vomiting Patient denies any chest pains or shortness of breath. Objective: Vital signs in last 24 hours: Temp:  [95.2 F (35.1 C)-98.9 F (37.2 C)] 97.8 F (36.6 C) (12/05 0258) Pulse Rate:  [48-85] 80 (12/05 0258) Resp:  [11-19] 18 (12/05 0258) BP: (83-163)/(50-94) 113/56 (12/05 0258) SpO2:  [97 %-100 %] 100 % (12/05 0258) FiO2 (%):  [21 %] 21 % (12/04 1545) Weight:  [95.3 kg (210 lb)] 95.3 kg (210 lb) (12/04 0905) Heels are non tender and elevated off the bed using rolled towels and bone foam  Intake/Output from previous day: 12/04 0701 - 12/05 0700 In: 3096.7 [P.O.:240; I.V.:2356.7; IV [id number] Out: 2685 [Urine:2150; Drains:485; Blood:50] Intake/Output this shift: No intake/output data recorded.   Recent Labs  03/06/16 0939 03/07/16 0352  HGB 14.3 13.3    Recent Labs  03/06/16 0939 03/07/16 0352  WBC  --  5.7  RBC  --  3.84*  HCT 42.0 37.7*  PLT  --  147*    Recent Labs  03/06/16 0939 03/07/16 0352  NA 139 135  K 3.9 3.8  CL  --  106  CO2  --  24  BUN  --  12  CREATININE  --  1.01  GLUCOSE 115* 127*  CALCIUM  --  8.1*   No results for input(s): LABPT, INR in the last 72 hours.  EXAM General - Patient is Alert, Appropriate and Oriented Extremity - Neurologically intact Neurovascular intact Sensation intact distally Intact pulses distally Dorsiflexion/Plantar flexion intact No cellulitis present Compartment soft Dressing - dressing C/D/I Motor Function - intact, moving foot and toes well on exam.    Past Medical History:  Diagnosis Date  . Allergy   . BPH (benign prostatic hypertrophy)   . Diabetes mellitus   . ED (erectile dysfunction)   .  History of prostate cancer   . Hx of adenomatous colonic polyps   . Hyperlipidemia   . Hypertension   . Osteoarthritis     Assessment/Plan: 1 Day Post-Op Procedure(s) (LRB): COMPUTER ASSISTED TOTAL KNEE ARTHROPLASTY (Right) Active Problems:   S/P total knee arthroplasty  Estimated body mass index is 27.71 kg/m as calculated from the following:   Height as of this encounter: 6\' 1"  (1.854 m).   Weight as of this encounter: 95.3 kg (210 lb). Advance diet Up with therapy D/C IV fluids Plan for discharge tomorrow Discharge home with home health  Labs: Were reviewed DVT Prophylaxis - Lovenox, Foot Pumps and TED hose Weight-Bearing as tolerated to right leg D/C O2 and Pulse OX and try on Room Air Begin working on bowel movement Labs tomorrow morning  Bianca Vester R. Robeson Mansfield 03/07/2016, 7:22 AM

## 2016-03-07 NOTE — Anesthesia Postprocedure Evaluation (Signed)
Anesthesia Post Note  Patient: Rodney Suarez  Procedure(s) Performed: Procedure(s) (LRB): COMPUTER ASSISTED TOTAL KNEE ARTHROPLASTY (Right)  Patient location during evaluation: Other Anesthesia Type: Spinal Level of consciousness: oriented and awake and alert Pain management: pain level controlled Vital Signs Assessment: post-procedure vital signs reviewed and stable Respiratory status: spontaneous breathing, respiratory function stable and patient connected to nasal cannula oxygen Cardiovascular status: blood pressure returned to baseline and stable Postop Assessment: no headache and no backache Anesthetic complications: no    Last Vitals:  Vitals:   03/06/16 2059 03/07/16 0258  BP: (!) 143/79 (!) 113/56  Pulse: 66 80  Resp: 18 18  Temp: 36.8 C 36.6 C    Last Pain:  Vitals:   03/07/16 0558  TempSrc:   PainSc: 3                  Alison Stalling

## 2016-03-07 NOTE — Progress Notes (Signed)
Occupational Therapy Treatment Patient Details Name: Tushar Dennee MRN: [id number] DOB: Apr 14, 1940 Today's Date: 03/07/2016    History of present illness Pt. is a 75 y.o. male who was admitted to [facility] for Right TKR   OT comments  Pt. Is a 75 y.o. Male who was admitted for a rightTKR. Pt presents with limited ROM, Pain, weakness, and impaired functional mobility which hinder his ability to complete ADL and IADL tasks. Pt. could benefit from skilled OT services to review A/E use for LE ADLs, to review necessary home modifications, and to improve functional mobility for ADL/IADLs in order to work towards regaining Independence with ADL/IADLs.   Follow Up Recommendations  SNF    Equipment Recommendations       Recommendations for Other Services      Precautions / Restrictions       Restrictions Weight Bearing Restrictions: No                                             ADLs:  Pt. Education was provided about A/E use for LE ADLs.                                              Vision  Glasses                   Perception     Praxis      Cognition   Behavior During Therapy: [facility] for tasks assessed/performed                         Extremity/Trunk Assessment               Exercises     Shoulder Instructions       General Comments      Pertinent Vitals/ Pain       Pain Assessment: 0-10 Pain Score: 4  Pain Intervention(s): Limited activity within patient's tolerance;Monitored during session;Repositioned  Home Living Family/patient expects to be discharged to:: Private residence Living Arrangements: Spouse/significant other   Type of Home: House Home Access: Stairs to enter Technical brewer of Steps: 2   Home Layout: One level     Bathroom Shower/Tub: Tub/shower unit Shower/tub characteristics: Architectural technologist: Standard Bathroom Accessibility: Yes              Prior  Functioning/Environment Level of Independence: Independent        Comments: Independent with ADLs, IADLs, driving, and working   Frequency  Min 1X/week        Progress Toward Goals  OT Goals(current goals can now be found in the care plan section)     ADL Goals Pt Will Perform Lower Body Dressing: with modified independence Pt Will Transfer to Toilet: with modified independence  Plan      Co-evaluation                 End of Session     Activity Tolerance Patient tolerated treatment well   Patient Left with call bell/phone within reach;in chair;with chair alarm set   Nurse Communication          Time: [id number] OT Time Calculation (min): 19 min  Charges: OT General Charges $OT Visit: 1 Procedure OT  Evaluation $OT Eval Moderate Complexity: 1 Procedure  Harrel Carina, MS, OTR/L 03/07/2016, 10:59 AM

## 2016-03-07 NOTE — Evaluation (Signed)
Physical Therapy Evaluation Patient Details Name: Rodney Suarez MRN: [id number] DOB: [birth year] Today's Date: 03/07/2016   History of Present Illness  75 y/o male s/p R TKA 12/4 with no post-op complications.   Clinical Impression  Pt showed good effort t/o PT session and generally is doing as good or better than expected for typical POD1 standards.  He was able to do SLRs, had 85 degrees of flexion and was able to ambulate to the nurses station.  He did lack TKE and had some hesitation with ambulation but overall did very well.  Pt eager to work with PT and get home.     Follow Up Recommendations Home health PT    Equipment Recommendations       Recommendations for Other Services       Precautions / Restrictions Precautions Precautions: Fall Restrictions Weight Bearing Restrictions: No      Mobility  Bed Mobility               General bed mobility comments: not tested, pt in recliner on arrival  Transfers Overall transfer level: Modified independent Equipment used: Rolling walker (2 wheeled)             General transfer comment: Pt slow and labored with getting to standing, but did not need direct assist to get to upright.  He did need a lot of cuing for positioning, set up, sequencing and heavy UE pushing on arm rests  Ambulation/Gait Ambulation/Gait assistance: Min guard Ambulation Distance (Feet): 65 Feet Assistive device: Rolling walker (2 wheeled)       General Gait Details: Pt with some R LE hesitation and he struggled to attain terminal knee extension but he had no buckling, no increased pain and generally was safe w/o signficant fatigue from the effort  Stairs            Wheelchair Mobility    Modified Rankin (Stroke Patients Only)       Balance                                             Pertinent Vitals/Pain Pain Assessment: 0-10 Pain Score: 2  (some increased pain with activity, only "3 or 4" after  sesso) Pain Intervention(s): Limited activity within patient's tolerance;Monitored during session;Repositioned    Home Living Family/patient expects to be discharged to:: Private residence Living Arrangements: Spouse/significant other   Type of Home: House Home Access: Stairs to enter [employer]:  (reports theres a post to hold to in the garage) Technical brewer of Steps: 2 Home Layout: One level Home Equipment: Walker - 2 wheels      Prior Function Level of Independence: Independent         Comments: Independent with ADLs, IADLs, driving, and working     Hand Dominance   Dominant Hand: Right    Extremity/Trunk Assessment   Upper Extremity Assessment: Overall WFL for tasks assessed           Lower Extremity Assessment: Overall WFL for tasks assessed;RLE deficits/detail RLE Deficits / Details: general R LE weakness 2/2 surgery, but able to do AROM SLRs, showed ability to hold quads against resistance, generally better than typical post-op strength        Communication   Communication: No difficulties  Cognition Arousal/Alertness: Awake/alert Behavior During Therapy: WFL for tasks assessed/performed Overall Cognitive Status: Within Functional Limits for tasks assessed  General Comments      Exercises Total Joint Exercises Ankle Circles/Pumps: AAROM;10 reps Quad Sets: Strengthening;10 reps Gluteal Sets: Strengthening;10 reps Heel Slides: Strengthening;10 reps Hip ABduction/ADduction: Strengthening;10 reps Straight Leg Raises: AROM;10 reps Long Arc Quad: Strengthening;10 reps Knee Flexion: PROM;5 reps Goniometric ROM: 3-85   Assessment/Plan    PT Assessment Patient needs continued PT services  PT Problem List Decreased strength;Decreased range of motion;Decreased activity tolerance;Decreased balance;Decreased mobility;Decreased knowledge of use of DME;Decreased safety awareness;Pain          PT Treatment  Interventions Gait training;DME instruction;Stair training;Functional mobility training;Therapeutic activities;Therapeutic exercise;Balance training;Neuromuscular re-education;Patient/family education    PT Goals (Current goals can be found in the Care Plan section)  Acute Rehab PT Goals Patient Stated Goal: go home PT Goal Formulation: With patient Time For Goal Achievement: 03/21/16 Potential to Achieve Goals: Good    Frequency BID   Barriers to discharge        Co-evaluation               End of Session Equipment Utilized During Treatment: Gait belt Activity Tolerance: Patient tolerated treatment well Patient left: with call bell/phone within reach;with chair alarm set;with family/visitor present           Time: [id number] PT Time Calculation (min) (ACUTE ONLY): 31 min   Charges:   PT Evaluation $PT Eval Low Complexity: 1 Procedure PT Treatments $Therapeutic Exercise: 8-22 mins   PT G Codes:        Kreg Shropshire, DPT 03/07/2016, 12:42 PM

## 2016-03-07 NOTE — Progress Notes (Signed)
Physical Therapy Treatment Patient Details Name: Rodney Suarez MRN: [id number] DOB: Sep 08, 1940 Today's Date: 03/07/2016    History of Present Illness 75 y/o male s/p R TKA 12/4 with no post-op complications.     PT Comments    Pt did very well with PT session this afternoon showing increased ROM, ambulation (cadence, distance, speed) and as well as more control with sit<>stand.  Pt has some expected pain but is not limited by it and generally was able to do all that was asked of him w/o issue.  Pt motivated and eager to go home tomorrow.   Follow Up Recommendations  Home health PT     Equipment Recommendations       Recommendations for Other Services       Precautions / Restrictions Precautions Precautions: Fall Restrictions RLE Weight Bearing: Weight bearing as tolerated    Mobility  Bed Mobility Overal bed mobility: Modified Independent             General bed mobility comments: Pt able to get in and out of bed with assist, showed good control of R LE lifting and placement  Transfers Overall transfer level: Modified independent Equipment used: Rolling walker (2 wheeled)             General transfer comment: Pt able to get up more smoothly, and controlling descent with cuing from PT.  Ambulation/Gait Ambulation/Gait assistance: Min guard Ambulation Distance (Feet): 85 Feet Assistive device: Rolling walker (2 wheeled)       General Gait Details: Pt shows improved stance phase tolerance on R and was actually able to mantain some forward walker momentum on a few steps and did much better with normalizing cadence/step length with improved confidence and speed per this AM   Stairs            Wheelchair Mobility    Modified Rankin (Stroke Patients Only)       Balance Overall balance assessment: Modified Independent                                  Cognition Arousal/Alertness: Awake/alert Behavior During Therapy: WFL for tasks  assessed/performed Overall Cognitive Status: Within Functional Limits for tasks assessed                      Exercises Total Joint Exercises Ankle Circles/Pumps: AAROM;10 reps Quad Sets: Strengthening;10 reps Gluteal Sets: Strengthening;10 reps Short Arc Quad: Strengthening;15 reps Heel Slides: Strengthening;10 reps Hip ABduction/ADduction: Strengthening;10 reps Straight Leg Raises: AROM;10 reps Knee Flexion: PROM;5 reps Goniometric ROM: 1-92    General Comments        Pertinent Vitals/Pain Pain Score: 4     Home Living                      Prior Function            PT Goals (current goals can now be found in the care plan section) Progress towards PT goals: Progressing toward goals    Frequency    BID      PT Plan Current plan remains appropriate    Co-evaluation             End of Session Equipment Utilized During Treatment: Gait belt Activity Tolerance: Patient tolerated treatment well Patient left: with call bell/phone within reach;with family/visitor present;with bed alarm set     Time: [id number] PT Time Calculation (min) (  ACUTE ONLY): 25 min  Charges:  $Gait Training: 8-22 mins $Therapeutic Exercise: 8-22 mins                    G Codes:      Kreg Shropshire, DPT 03/07/2016, 4:29 PM

## 2016-03-07 NOTE — Discharge Summary (Signed)
Physician Discharge Summary  Patient ID: Rodney Suarez MRN: [id number] DOB/AGE: 1940-07-26 75 y.o.  Admit date: 03/06/2016 Discharge date: 03/08/2016  Admission Diagnoses:  PRIMARY OSTEOARTHRITIS RIGHT KNEE   Discharge Diagnoses: Patient Active Problem List   Diagnosis Date Noted  . S/P total knee arthroplasty 03/06/2016  . Advance directive discussed with patient 02/10/2016  . Primary osteoarthritis of right knee 12/31/2015  . Routine general medical examination at a health care facility 12/28/2010  . OSTEOARTHRITIS 12/22/2009  . PROSTATE CANCER, HX OF 12/22/2008  . Hyperlipemia 10/08/2007  . BENIGN PROSTATIC HYPERTROPHY 10/08/2007  . ADENOMATOUS COLONIC POLYP 03/07/2007  . Diabetes mellitus type 2, controlled, without complications ([place]) [id number]  . Essential hypertension, benign 03/07/2007  . ALLERGIC RHINITIS 03/07/2007    Past Medical History:  Diagnosis Date  . Allergy   . BPH (benign prostatic hypertrophy)   . Diabetes mellitus   . ED (erectile dysfunction)   . History of prostate cancer   . Hx of adenomatous colonic polyps   . Hyperlipidemia   . Hypertension   . Osteoarthritis      Transfusion: No transfusions given doing this admission   Consultants (if any):  none  Discharged Condition: Improved  Hospital Course: Rodney Suarez is an 75 y.o. male who was admitted 03/06/2016 with a diagnosis of degenerative arthrosis right knee and went to the operating room on 03/06/2016 and underwent the above named procedures.    Surgeries:Procedure(s): COMPUTER ASSISTED TOTAL KNEE ARTHROPLASTY on 03/06/2016  PRE-OPERATIVE DIAGNOSIS: Degenerative arthrosis of the right knee, primary  POST-OPERATIVE DIAGNOSIS:  Same  PROCEDURE:  Right total knee arthroplasty using computer-assisted navigation  SURGEON:  Marciano Sequin. M.D.  ASSISTANT:  Vance Peper, PA (present and scrubbed throughout the case, critical for assistance with exposure, retraction,  instrumentation, and closure)  ANESTHESIA: spinal  ESTIMATED BLOOD LOSS: 50 mL  FLUIDS REPLACED: 1100 mL of crystalloid  TOURNIQUET TIME: 104 minutes  DRAINS: 2 medium drains to a reinfusion system  SOFT TISSUE RELEASES: Anterior cruciate ligament, posterior cruciate ligament, deep medial collateral ligament, patellofemoral ligament  IMPLANTS UTILIZED: DePuy Attune size 6 posterior stabilized femoral component (cemented), size 7 rotating platform tibial component (cemented), 41 mm medialized dome patella (cemented), and a 10 mm stabilized rotating platform polyethylene insert.  INDICATIONS FOR SURGERY: Rodney Suarez is a 75 y.o. year old male with a long history of progressive knee pain. X-rays demonstrated severe degenerative changes in tricompartmental fashion. The patient had not seen any significant improvement despite conservative nonsurgical intervention. After discussion of the risks and benefits of surgical intervention, the patient expressed understanding of the risks benefits and agree with plans for total knee arthroplasty.   The risks, benefits, and alternatives were discussed at length including but not limited to the risks of infection, bleeding, nerve injury, stiffness, blood clots, the need for revision surgery, cardiopulmonary complications, among others, and they were willing to proceed. Patient tolerated the surgery well. No complications .Patient was taken to PACU where she was stabilized and then transferred to the orthopedic floor.  Patient started on Lovenox 30 q 12 hrs. Foot pumps applied bilaterally at 80 mm hgb. Heels elevated off bed with rolled towels. No evidence of DVT. Calves non tender. Negative [place]. Physical therapy started on day #1 for gait training and transfer with OT starting on  day #1 for ADL and assisted devices. Patient has done well with therapy. Ambulated greater than 200 feet upon being discharged. Was able to go up and down 4 steps  independently  and safely.  Patient's IV and Foley were discontinued on day #1 with Hemovac being discontinued on day #2 along with dressing change.   He was given perioperative antibiotics:  Anti-infectives    Start     Dose/Rate Route Frequency Ordered Stop   03/06/16 1700  ceFAZolin (ANCEF) IVPB 2g/100 mL premix     2 g 200 mL/hr over 30 Minutes Intravenous Every 6 hours 03/06/16 1544 03/07/16 1659   03/06/16 0805  ceFAZolin (ANCEF) 2-4 GM/100ML-% IVPB    Comments:  Suarez, Rodney: cabinet override      03/06/16 0805 03/06/16 2014   03/05/16 2200  ceFAZolin (ANCEF) IVPB 2g/100 mL premix  Status:  Discontinued     2 g 200 mL/hr over 30 Minutes Intravenous  Once 03/05/16 2151 03/06/16 0909    .  He was fitted with AV 1 compression foot pump devices, instructed on heel pumps, early ambulation, and TED stockings bilaterally for DVT prophylaxis.  He benefited maximally from the hospital stay and there were no complications.    Recent vital signs:  Vitals:   03/07/16 0258 03/07/16 0726  BP: (!) 113/56 120/78  Pulse: 80 67  Resp: 18 18  Temp: 97.8 F (36.6 C) 97.4 F (36.3 C)    Recent laboratory studies:  Lab Results  Component Value Date   HGB 13.3 03/07/2016   HGB 14.3 03/06/2016   HGB 13.9 02/23/2016   Lab Results  Component Value Date   WBC 5.7 03/07/2016   PLT 147 (L) 03/07/2016   Lab Results  Component Value Date   INR 1.05 02/23/2016   Lab Results  Component Value Date   NA 135 03/07/2016   K 3.8 03/07/2016   CL 106 03/07/2016   CO2 24 03/07/2016   BUN 12 03/07/2016   CREATININE 1.01 03/07/2016   GLUCOSE 127 (H) 03/07/2016    Discharge Medications:     Medication List    TAKE these medications   amLODipine 10 MG tablet Commonly known as:  NORVASC TAKE 1 TABLET BY MOUTH EVERY DAY   aspirin EC 81 MG tablet Take 81 mg by mouth daily.   enoxaparin 40 MG/0.4ML injection Commonly known as:  LOVENOX Inject 0.4 mLs (40 mg total) into the skin  daily.   lisinopril-hydrochlorothiazide 20-12.5 MG tablet Commonly known as:  PRINZIDE,ZESTORETIC TAKE 2 TABLETS BY MOUTH EVERY DAY   LORazepam 1 MG tablet Commonly known as:  ATIVAN TAKE 1 TO 2 TABLETS BY MOUTH AT BEDTIME AS NEEDED   oxyCODONE 5 MG immediate release tablet Commonly known as:  Oxy IR/ROXICODONE Take 1-2 tablets (5-10 mg total) by mouth every 4 (four) hours as needed for severe pain or breakthrough pain.   Potassium Chloride ER 20 MEQ Tbcr Take 1 tablet by mouth daily.   pravastatin 20 MG tablet Commonly known as:  PRAVACHOL TAKE 1 TABLET BY MOUTH EVERY DAY   traMADol 50 MG tablet Commonly known as:  ULTRAM Take 1-2 tablets (50-100 mg total) by mouth every 4 (four) hours as needed for moderate pain.            Durable Medical Equipment        Start     Ordered   03/06/16 1545  DME Walker rolling  Once    Question:  Patient needs a walker to treat with the following condition  Answer:  Total knee replacement status   03/06/16 1544   03/06/16 1545  DME Bedside commode  Once    Question:  Patient needs a bedside commode to treat with the following condition  Answer:  Total knee replacement status   03/06/16 1544      Diagnostic Studies: Dg Knee Right Port  Result Date: 03/06/2016 CLINICAL DATA:  Knee replacement. EXAM: PORTABLE RIGHT KNEE - 1-2 VIEW COMPARISON:  10/10/2005 . FINDINGS: Total right knee replacement. Hardware intact. Anatomic alignment. No acute abnormality. IMPRESSION: Total right knee replacement.  Anatomic alignment noted. Electronically Signed   By: Marcello Moores  Register   On: 03/06/2016 14:53    Disposition: Final discharge disposition not confirmed    Follow-up Information    Alekai Pocock R., PA On 03/21/2016.   Specialty:  Physician Assistant Why:  at 11:00am Contact information: [place] [address] [phone]        Dereck Leep, MD On 04/18/2016.   Specialty:  Orthopedic  Surgery Why:  at 9:45am Contact information: [place] [address] [phone]            Signed: Watt Climes. 03/07/2016, 7:29 AM

## 2016-03-08 LAB — BASIC METABOLIC PANEL
Anion gap: 8 (ref 5–15)
BUN: 20 mg/dL (ref 6–20)
CALCIUM: 8.5 mg/dL — AB (ref 8.9–10.3)
CO2: 26 mmol/L (ref 22–32)
CREATININE: 1.34 mg/dL — AB (ref 0.61–1.24)
Chloride: 99 mmol/L — ABNORMAL LOW (ref 101–111)
GFR, EST AFRICAN AMERICAN: 57 mL/min — AB (ref >60)
GFR, EST NON AFRICAN AMERICAN: 49 mL/min — AB (ref >60)
Glucose, Bld: 122 mg/dL — ABNORMAL HIGH (ref 65–99)
Potassium: 3.7 mmol/L (ref 3.5–5.1)
SODIUM: 133 mmol/L — AB (ref 135–145)

## 2016-03-08 LAB — CBC
HCT: 37.9 % — ABNORMAL LOW (ref 40.0–52.0)
Hemoglobin: 13.2 g/dL (ref 13.0–18.0)
MCH: 33.7 pg (ref 26.0–34.0)
MCHC: 34.9 g/dL (ref 32.0–36.0)
MCV: 96.7 fL (ref 80.0–100.0)
PLATELETS: 133 10*3/uL — AB (ref 150–440)
RBC: 3.92 MIL/uL — ABNORMAL LOW (ref 4.40–5.90)
RDW: 14.7 % — ABNORMAL HIGH (ref 11.5–14.5)
WBC: 6.3 10*3/uL (ref 3.8–10.6)

## 2016-03-08 LAB — GLUCOSE, CAPILLARY: GLUCOSE-CAPILLARY: 116 mg/dL — AB (ref 65–99)

## 2016-03-08 LAB — SURGICAL PATHOLOGY

## 2016-03-08 MED ORDER — ENOXAPARIN SODIUM 40 MG/0.4ML ~~LOC~~ SOLN: 40.0000 mg | SUBCUTANEOUS | 0 refills | Status: DC

## 2016-03-08 MED ORDER — OXYCODONE HCL 5 MG PO TABS: 5.0000 mg | ORAL_TABLET | ORAL | 0 refills | Status: DC | PRN

## 2016-03-08 MED ORDER — TRAMADOL HCL 50 MG PO TABS: 50.0000 mg | ORAL_TABLET | ORAL | 0 refills | Status: DC | PRN

## 2016-03-08 NOTE — Care Management Important Message (Signed)
Important Message  Patient Details  Name: Shant Renew MRN: [id number] Date of Birth: May 06, 1940   Medicare Important Message Given:  Yes    Jolly Mango, RN 03/08/2016, 9:31 AM

## 2016-03-08 NOTE — Progress Notes (Signed)
Physical Therapy Treatment Patient Details Name: Haseeb Lecours MRN: [id number] DOB: Aug 10, 1940 Today's Date: 03/08/2016    History of Present Illness 75 y/o male s/p R TKA 12/4 with no post-op complications.     PT Comments    Pt able to ambulate around nursing unit this am and then up/down 4 stairs with bilateral rails.  Pt did well with mobility skills today with good safety and no lob's.  0-98 AAROM in sitting which improves daily.  Participated in exercises as described below.  Pt has HEP and has no further questions at this time.  Wife is to bring in her RW from home.  Will adjust for proper fit before discharge.   Follow Up Recommendations  Home health PT     Equipment Recommendations  Other (comment)    Recommendations for Other Services       Precautions / Restrictions Precautions Precautions: Fall Restrictions Weight Bearing Restrictions: No RLE Weight Bearing: Weight bearing as tolerated    Mobility  Bed Mobility                  Transfers Overall transfer level: Modified independent Equipment used: Rolling walker (2 wheeled)             General transfer comment: steady with good safety  Ambulation/Gait Ambulation/Gait assistance: Min guard Ambulation Distance (Feet): 200 Feet Assistive device: Rolling walker (2 wheeled) Gait Pattern/deviations: Decreased stance time - left;Decreased step length - right   Gait velocity interpretation: Below normal speed for age/gender General Gait Details: no lob or buckling noted   Stairs Stairs: Yes Stairs assistance: Min guard Stair Management: Two rails Number of Stairs: 4 General stair comments: able to navigate well with educatation for step sequencing  Wheelchair Mobility    Modified Rankin (Stroke Patients Only)       Balance Overall balance assessment: Modified Independent                                  Cognition Arousal/Alertness: Awake/alert Behavior During Therapy:  WFL for tasks assessed/performed Overall Cognitive Status: Within Functional Limits for tasks assessed                      Exercises Total Joint Exercises Ankle Circles/Pumps: AAROM;10 reps Quad Sets: Strengthening;10 reps Heel Slides: Strengthening;10 reps Straight Leg Raises: AROM;10 reps Long Arc Quad: Strengthening;10 reps Knee Flexion: PROM;5 reps Goniometric ROM: 0-98    General Comments        Pertinent Vitals/Pain Pain Assessment: 0-10 Pain Score: 3  Pain Descriptors / Indicators: Sore Pain Intervention(s): Premedicated before session;Ice applied    Home Living                      Prior Function            PT Goals (current goals can now be found in the care plan section) Progress towards PT goals: Progressing toward goals    Frequency    BID      PT Plan Current plan remains appropriate    Co-evaluation             End of Session Equipment Utilized During Treatment: Gait belt Activity Tolerance: Patient tolerated treatment well Patient left: with call bell/phone within reach;with family/visitor present;with bed alarm set     Time: [id number] PT Time Calculation (min) (ACUTE ONLY): 22 min  Charges:  $Gait Training: 8-22  mins $Therapeutic Exercise: 8-22 mins                    G Codes:      Chesley Noon 04/05/2016, 8:51 AM

## 2016-03-08 NOTE — Care Management Note (Addendum)
Case Management Note  Patient Details  Name: Rodney Suarez MRN: [id number] Date of Birth: 01/18/1941  Subjective/Objective:  Discharging today. Cost of Lovenox is 93.33. Patient updated.                   Action/Plan: [facility] notified of discharge  Expected Discharge Date:                  Expected Discharge Plan:  [place]  In-House Referral:     Discharge planning Services  CM Consult  Post Acute Care Choice:  [place], Durable Medical Equipment Choice offered to:  Patient, Spouse  DME Arranged:  3-N-1 DME Agency:  [place]:  PT [place]:  [facility] (now [facility])  Status of Service:  Completed, signed off  If discussed at [place] of Stay Meetings, dates discussed:    Additional Comments:  Jolly Mango, RN 03/08/2016, 9:17 AM

## 2016-03-08 NOTE — Progress Notes (Signed)
DISCHARGE NOTE:  Pt given discharge instructions and prescriptions (Oxycodone, Tramadol), Lovenox called in. Pt verbalized understanding. Pt wheeled to car by staff.  (Pts walker sent home with him). Pt given 2 honeycomb dressings, TEDS hose on.

## 2016-03-08 NOTE — Progress Notes (Signed)
   Subjective: 2 Days Post-Op Procedure(s) (LRB): COMPUTER ASSISTED TOTAL KNEE ARTHROPLASTY (Right) Patient reports pain as 3 on 0-10 scale.   Patient is well, and has had no acute complaints or problems Continue with physical therapy today.  Plan is to go Home after hospital stay. no nausea and no vomiting Patient denies any chest pains or shortness of breath. Objective: Vital signs in last 24 hours: Temp:  [97.4 F (36.3 C)-98.2 F (36.8 C)] 97.8 F (36.6 C) (12/06 0349) Pulse Rate:  [62-92] 77 (12/06 0349) Resp:  [16-18] 18 (12/06 0349) BP: (120-140)/(63-78) 131/68 (12/06 0349) SpO2:  [99 %-100 %] 99 % (12/06 0349) well approximated incision Heels are non tender and elevated off the bed using rolled towels Intake/Output from previous day: 12/05 0701 - 12/06 0700 In: 600 [P.O.:600] Out: 500 [Urine:200; Drains:300] Intake/Output this shift: Total I/O In: 600 [P.O.:600] Out: 500 [Urine:200; Drains:300]   Recent Labs  03/06/16 0939 03/07/16 0352 03/08/16 0302  HGB 14.3 13.3 13.2    Recent Labs  03/07/16 0352 03/08/16 0302  WBC 5.7 6.3  RBC 3.84* 3.92*  HCT 37.7* 37.9*  PLT 147* 133*    Recent Labs  03/07/16 0352 03/08/16 0302  NA 135 133*  K 3.8 3.7  CL 106 99*  CO2 24 26  BUN 12 20  CREATININE 1.01 1.34*  GLUCOSE 127* 122*  CALCIUM 8.1* 8.5*   No results for input(s): LABPT, INR in the last 72 hours.  EXAM General - Patient is Alert, Appropriate and Oriented Extremity - Neurologically intact Neurovascular intact Sensation intact distally Intact pulses distally Dorsiflexion/Plantar flexion intact No cellulitis present Compartment soft Dressing - dressing C/D/I and Hemovac site does have some increased drainage Motor Function - intact, moving foot and toes well on exam.     Past Medical History:  Diagnosis Date  . Allergy   . BPH (benign prostatic hypertrophy)   . Diabetes mellitus   . ED (erectile dysfunction)   . History of prostate  cancer   . Hx of adenomatous colonic polyps   . Hyperlipidemia   . Hypertension   . Osteoarthritis     Assessment/Plan: 2 Days Post-Op Procedure(s) (LRB): COMPUTER ASSISTED TOTAL KNEE ARTHROPLASTY (Right) Active Problems:   S/P total knee arthroplasty  Estimated body mass index is 27.71 kg/m as calculated from the following:   Height as of this encounter: 6\' 1"  (1.854 m).   Weight as of this encounter: 95.3 kg (210 lb). Up with therapy Discharge home with home health  Labs: Were reviewed DVT Prophylaxis - Lovenox, Foot Pumps and TED hose Weight-Bearing as tolerated to right leg Hemovac was discontinued on today's visit. Please change dressing prior to patient being discharged. Patient needs to do the lap around the nurse's desk, does steps and have a bowel movement prior to being discharged.  Jillyn Ledger. Bradford Martinez 03/08/2016, 6:52 AM

## 2016-03-09 DIAGNOSIS — E119 Type 2 diabetes mellitus without complications: Secondary | ICD-10-CM | POA: Diagnosis not present

## 2016-03-09 DIAGNOSIS — Z471 Aftercare following joint replacement surgery: Secondary | ICD-10-CM | POA: Diagnosis not present

## 2016-03-09 DIAGNOSIS — E785 Hyperlipidemia, unspecified: Secondary | ICD-10-CM | POA: Diagnosis not present

## 2016-03-09 DIAGNOSIS — Z79891 Long term (current) use of opiate analgesic: Secondary | ICD-10-CM | POA: Diagnosis not present

## 2016-03-09 DIAGNOSIS — Z96651 Presence of right artificial knee joint: Secondary | ICD-10-CM | POA: Diagnosis not present

## 2016-03-09 DIAGNOSIS — I10 Essential (primary) hypertension: Secondary | ICD-10-CM | POA: Diagnosis not present

## 2016-03-09 DIAGNOSIS — Z7901 Long term (current) use of anticoagulants: Secondary | ICD-10-CM | POA: Diagnosis not present

## 2016-03-11 DIAGNOSIS — Z96651 Presence of right artificial knee joint: Secondary | ICD-10-CM | POA: Diagnosis not present

## 2016-03-11 DIAGNOSIS — Z79891 Long term (current) use of opiate analgesic: Secondary | ICD-10-CM | POA: Diagnosis not present

## 2016-03-11 DIAGNOSIS — I10 Essential (primary) hypertension: Secondary | ICD-10-CM | POA: Diagnosis not present

## 2016-03-11 DIAGNOSIS — E119 Type 2 diabetes mellitus without complications: Secondary | ICD-10-CM | POA: Diagnosis not present

## 2016-03-11 DIAGNOSIS — Z471 Aftercare following joint replacement surgery: Secondary | ICD-10-CM | POA: Diagnosis not present

## 2016-03-11 DIAGNOSIS — E785 Hyperlipidemia, unspecified: Secondary | ICD-10-CM | POA: Diagnosis not present

## 2016-03-11 DIAGNOSIS — Z7901 Long term (current) use of anticoagulants: Secondary | ICD-10-CM | POA: Diagnosis not present

## 2016-03-13 DIAGNOSIS — Z7901 Long term (current) use of anticoagulants: Secondary | ICD-10-CM | POA: Diagnosis not present

## 2016-03-13 DIAGNOSIS — E785 Hyperlipidemia, unspecified: Secondary | ICD-10-CM | POA: Diagnosis not present

## 2016-03-13 DIAGNOSIS — E119 Type 2 diabetes mellitus without complications: Secondary | ICD-10-CM | POA: Diagnosis not present

## 2016-03-13 DIAGNOSIS — Z96651 Presence of right artificial knee joint: Secondary | ICD-10-CM | POA: Diagnosis not present

## 2016-03-13 DIAGNOSIS — Z471 Aftercare following joint replacement surgery: Secondary | ICD-10-CM | POA: Diagnosis not present

## 2016-03-13 DIAGNOSIS — Z79891 Long term (current) use of opiate analgesic: Secondary | ICD-10-CM | POA: Diagnosis not present

## 2016-03-13 DIAGNOSIS — I10 Essential (primary) hypertension: Secondary | ICD-10-CM | POA: Diagnosis not present

## 2016-03-15 DIAGNOSIS — Z7901 Long term (current) use of anticoagulants: Secondary | ICD-10-CM | POA: Diagnosis not present

## 2016-03-15 DIAGNOSIS — Z79891 Long term (current) use of opiate analgesic: Secondary | ICD-10-CM | POA: Diagnosis not present

## 2016-03-15 DIAGNOSIS — E785 Hyperlipidemia, unspecified: Secondary | ICD-10-CM | POA: Diagnosis not present

## 2016-03-15 DIAGNOSIS — I10 Essential (primary) hypertension: Secondary | ICD-10-CM | POA: Diagnosis not present

## 2016-03-15 DIAGNOSIS — E119 Type 2 diabetes mellitus without complications: Secondary | ICD-10-CM | POA: Diagnosis not present

## 2016-03-15 DIAGNOSIS — Z471 Aftercare following joint replacement surgery: Secondary | ICD-10-CM | POA: Diagnosis not present

## 2016-03-15 DIAGNOSIS — Z96651 Presence of right artificial knee joint: Secondary | ICD-10-CM | POA: Diagnosis not present

## 2016-03-17 DIAGNOSIS — Z7901 Long term (current) use of anticoagulants: Secondary | ICD-10-CM | POA: Diagnosis not present

## 2016-03-17 DIAGNOSIS — I10 Essential (primary) hypertension: Secondary | ICD-10-CM | POA: Diagnosis not present

## 2016-03-17 DIAGNOSIS — Z96651 Presence of right artificial knee joint: Secondary | ICD-10-CM | POA: Diagnosis not present

## 2016-03-17 DIAGNOSIS — Z79891 Long term (current) use of opiate analgesic: Secondary | ICD-10-CM | POA: Diagnosis not present

## 2016-03-17 DIAGNOSIS — E119 Type 2 diabetes mellitus without complications: Secondary | ICD-10-CM | POA: Diagnosis not present

## 2016-03-17 DIAGNOSIS — Z471 Aftercare following joint replacement surgery: Secondary | ICD-10-CM | POA: Diagnosis not present

## 2016-03-17 DIAGNOSIS — E785 Hyperlipidemia, unspecified: Secondary | ICD-10-CM | POA: Diagnosis not present

## 2016-03-20 DIAGNOSIS — E785 Hyperlipidemia, unspecified: Secondary | ICD-10-CM | POA: Diagnosis not present

## 2016-03-20 DIAGNOSIS — Z7901 Long term (current) use of anticoagulants: Secondary | ICD-10-CM | POA: Diagnosis not present

## 2016-03-20 DIAGNOSIS — Z96651 Presence of right artificial knee joint: Secondary | ICD-10-CM | POA: Diagnosis not present

## 2016-03-20 DIAGNOSIS — E119 Type 2 diabetes mellitus without complications: Secondary | ICD-10-CM | POA: Diagnosis not present

## 2016-03-20 DIAGNOSIS — Z79891 Long term (current) use of opiate analgesic: Secondary | ICD-10-CM | POA: Diagnosis not present

## 2016-03-20 DIAGNOSIS — I10 Essential (primary) hypertension: Secondary | ICD-10-CM | POA: Diagnosis not present

## 2016-03-20 DIAGNOSIS — Z471 Aftercare following joint replacement surgery: Secondary | ICD-10-CM | POA: Diagnosis not present

## 2016-03-21 DIAGNOSIS — I10 Essential (primary) hypertension: Secondary | ICD-10-CM | POA: Diagnosis not present

## 2016-03-21 DIAGNOSIS — Z96651 Presence of right artificial knee joint: Secondary | ICD-10-CM | POA: Diagnosis not present

## 2016-03-21 DIAGNOSIS — E785 Hyperlipidemia, unspecified: Secondary | ICD-10-CM | POA: Diagnosis not present

## 2016-03-21 DIAGNOSIS — Z7901 Long term (current) use of anticoagulants: Secondary | ICD-10-CM | POA: Diagnosis not present

## 2016-03-21 DIAGNOSIS — E119 Type 2 diabetes mellitus without complications: Secondary | ICD-10-CM | POA: Diagnosis not present

## 2016-03-21 DIAGNOSIS — Z471 Aftercare following joint replacement surgery: Secondary | ICD-10-CM | POA: Diagnosis not present

## 2016-03-21 DIAGNOSIS — Z79891 Long term (current) use of opiate analgesic: Secondary | ICD-10-CM | POA: Diagnosis not present

## 2016-03-22 DIAGNOSIS — Z96651 Presence of right artificial knee joint: Secondary | ICD-10-CM | POA: Diagnosis not present

## 2016-03-22 DIAGNOSIS — M6281 Muscle weakness (generalized): Secondary | ICD-10-CM | POA: Diagnosis not present

## 2016-03-22 DIAGNOSIS — M25661 Stiffness of right knee, not elsewhere classified: Secondary | ICD-10-CM | POA: Diagnosis not present

## 2016-03-22 DIAGNOSIS — M25561 Pain in right knee: Secondary | ICD-10-CM | POA: Diagnosis not present

## 2016-03-29 DIAGNOSIS — Z96651 Presence of right artificial knee joint: Secondary | ICD-10-CM | POA: Diagnosis not present

## 2016-03-29 DIAGNOSIS — M25661 Stiffness of right knee, not elsewhere classified: Secondary | ICD-10-CM | POA: Diagnosis not present

## 2016-03-29 DIAGNOSIS — M6281 Muscle weakness (generalized): Secondary | ICD-10-CM | POA: Diagnosis not present

## 2016-03-29 DIAGNOSIS — M25561 Pain in right knee: Secondary | ICD-10-CM | POA: Diagnosis not present

## 2016-04-04 ENCOUNTER — Encounter: Payer: Self-pay | Admitting: Family Medicine

## 2016-04-04 ENCOUNTER — Ambulatory Visit (INDEPENDENT_AMBULATORY_CARE_PROVIDER_SITE_OTHER): Payer: PPO | Admitting: Family Medicine

## 2016-04-04 ENCOUNTER — Other Ambulatory Visit: Payer: Self-pay | Admitting: Internal Medicine

## 2016-04-04 DIAGNOSIS — L723 Sebaceous cyst: Secondary | ICD-10-CM | POA: Diagnosis not present

## 2016-04-04 DIAGNOSIS — L089 Local infection of the skin and subcutaneous tissue, unspecified: Secondary | ICD-10-CM | POA: Diagnosis not present

## 2016-04-04 MED ORDER — CEPHALEXIN 500 MG PO CAPS: 500.0000 mg | ORAL_CAPSULE | Freq: Three times a day (TID) | ORAL | 0 refills | Status: DC

## 2016-04-04 NOTE — Progress Notes (Signed)
   Subjective:    Patient ID: Rodney Suarez, male    DOB: [birth year], 76 y.o.   MRN: [id number]  HPI 76 year old male pt of Dr. Silvio Pate  With  History of DM, HTN, high chol, prostate cancer presents with new onset rash.  He first noted area on left upper back for year.Marland Kitchen occ itchy, applys OTC cream.  1 month ago surgery on knee and has been lying on back.. Now lesion on back in last week enlarged, tender, redness, no warmth.  Worsening pain in last few days. No discharge.  No fever.  Feeling well otherwise. No flu-like symptoms.  Has put cream on it.. Not sure what. Wife applied it.    Review of Systems  Constitutional: Negative for fatigue.  HENT: Negative for ear pain.   Eyes: Negative for pain.  Respiratory: Negative for shortness of breath.   Cardiovascular: Negative for chest pain.       Objective:   Physical Exam  Constitutional: Vital signs are normal. He appears well-developed and well-nourished.  HENT:  Head: Normocephalic.  Right Ear: Hearing normal.  Left Ear: Hearing normal.  Nose: Nose normal.  Mouth/Throat: Oropharynx is clear and moist and mucous membranes are normal.  Neck: Trachea normal. Carotid bruit is not present. No thyroid mass and no thyromegaly present.  Cardiovascular: Normal rate, regular rhythm and normal pulses.  Exam reveals no gallop, no distant heart sounds and no friction rub.   No murmur heard. No peripheral edema  Pulmonary/Chest: Effort normal and breath sounds normal. No respiratory distress.  Skin: Skin is warm, dry and intact. Lesion noted. No rash noted.     2.5 cm diameter infected sebaceous cyst on upper back.. Limited surrounding erythema  Psychiatric: He has a normal mood and affect. His speech is normal and behavior is normal. Thought content normal.          Assessment & Plan:  I&D  Meds, vitals, and allergies reviewed.   Indication: suspect abscess/ infected sebaceous cyst  Pt complaints of: erythema, pain,  swelling  Location: upper back  Size: 2.5 cm  Verbal informed consent obtained.  Pt aware of risks not limited to but including infection, bleeding, damage to near by organs.  Prep: etoh/betadine  Anesthesia: 1%lidocaine with epi, good effect  Incision made with #11 blade  Would explored and  No loculations   Wound packed with iodoform gauze  Tolerated well  Routine postprocedure instructions d/w pt- remove packing in 24-48h, keep area clean and bandaged, follow up if concerns/spreading erythema/pain.

## 2016-04-04 NOTE — Patient Instructions (Signed)
Use tylenol for pain. Remove packing tommorow evening.  Flush area 2 times daily with warm soapy water to keep area draining once packing removed.  Compelte 7 days of antibiotic. We will call with wound culture results.  Call if not resolving as expected over next week.

## 2016-04-04 NOTE — Progress Notes (Signed)
Pre visit review using our clinic review tool, if applicable. No additional management support is needed unless otherwise documented below in the visit note. 

## 2016-04-04 NOTE — Assessment & Plan Note (Signed)
Will treat with I and D and oral antibiotics.  Send for culture to ID bacteria if able.. Pt has minimal risk for MRSA but was in hospital 1 month ago.

## 2016-04-05 NOTE — Telephone Encounter (Signed)
Last filled 03-02-16 #60 Last OV CPE 02-10-16 No Future OV

## 2016-04-05 NOTE — Telephone Encounter (Signed)
Left refill on voice mail at pharmacy  

## 2016-04-05 NOTE — Telephone Encounter (Signed)
Approved: #60 x 0 

## 2016-04-07 LAB — WOUND CULTURE: ORGANISM ID, BACTERIA: NO GROWTH

## 2016-04-18 DIAGNOSIS — Z96651 Presence of right artificial knee joint: Secondary | ICD-10-CM | POA: Diagnosis not present

## 2016-04-22 ENCOUNTER — Other Ambulatory Visit: Payer: Self-pay | Admitting: Internal Medicine

## 2016-04-24 DIAGNOSIS — Z471 Aftercare following joint replacement surgery: Secondary | ICD-10-CM | POA: Diagnosis not present

## 2016-04-26 ENCOUNTER — Other Ambulatory Visit: Payer: Self-pay | Admitting: Internal Medicine

## 2016-05-05 ENCOUNTER — Other Ambulatory Visit: Payer: Self-pay | Admitting: Internal Medicine

## 2016-05-08 ENCOUNTER — Other Ambulatory Visit: Payer: Self-pay | Admitting: Internal Medicine

## 2016-05-08 NOTE — Telephone Encounter (Signed)
Approved: #60 x 0 

## 2016-05-08 NOTE — Telephone Encounter (Signed)
Left refill on voice mail at pharmacy  

## 2016-05-08 NOTE — Telephone Encounter (Signed)
Last filled 04-05-16 #60 Last OV with Dr Silvio Pate 02-10-16 for [facility] No Future OV

## 2016-05-22 ENCOUNTER — Ambulatory Visit: Payer: Self-pay | Admitting: Primary Care

## 2016-05-22 ENCOUNTER — Encounter: Payer: Self-pay | Admitting: Internal Medicine

## 2016-05-22 ENCOUNTER — Encounter (INDEPENDENT_AMBULATORY_CARE_PROVIDER_SITE_OTHER): Payer: Self-pay

## 2016-05-22 ENCOUNTER — Ambulatory Visit (INDEPENDENT_AMBULATORY_CARE_PROVIDER_SITE_OTHER): Payer: PPO | Admitting: Internal Medicine

## 2016-05-22 ENCOUNTER — Telehealth: Payer: Self-pay | Admitting: Internal Medicine

## 2016-05-22 VITALS — BP 130/70 | HR 76 | Temp 98.2°F | Wt 212.0 lb

## 2016-05-22 DIAGNOSIS — N3001 Acute cystitis with hematuria: Secondary | ICD-10-CM | POA: Diagnosis not present

## 2016-05-22 DIAGNOSIS — R319 Hematuria, unspecified: Secondary | ICD-10-CM

## 2016-05-22 LAB — POC URINALSYSI DIPSTICK (AUTOMATED)
BILIRUBIN UA: NEGATIVE
Glucose, UA: NEGATIVE
KETONES UA: NEGATIVE
Nitrite, UA: POSITIVE
PH UA: 6
Urobilinogen, UA: NEGATIVE

## 2016-05-22 MED ORDER — CIPROFLOXACIN HCL 250 MG PO TABS: 250.0000 mg | ORAL_TABLET | Freq: Two times a day (BID) | ORAL | 0 refills | Status: DC

## 2016-05-22 NOTE — Patient Instructions (Signed)
Urinary Tract Infection, Adult Introduction A urinary tract infection (UTI) is an infection of any part of the urinary tract. The urinary tract includes the:  Kidneys.  Ureters.  Bladder.  Urethra. These organs make, store, and get rid of pee (urine) in the body. Follow these instructions at home:  Take over-the-counter and prescription medicines only as told by your doctor.  If you were prescribed an antibiotic medicine, take it as told by your doctor. Do not stop taking the antibiotic even if you start to feel better.  Avoid the following drinks:  Alcohol.  Caffeine.  Tea.  Carbonated drinks.  Drink enough fluid to keep your pee clear or pale yellow.  Keep all follow-up visits as told by your doctor. This is important.  Make sure to:  Empty your bladder often and completely. Do not to hold pee for long periods of time.  Empty your bladder before and after sex.  Wipe from front to back after a bowel movement if you are male. Use each tissue one time when you wipe. Contact a doctor if:  You have back pain.  You have a fever.  You feel sick to your stomach (nauseous).  You throw up (vomit).  Your symptoms do not get better after 3 days.  Your symptoms go away and then come back. Get help right away if:  You have very bad back pain.  You have very bad lower belly (abdominal) pain.  You are throwing up and cannot keep down any medicines or water. This information is not intended to replace advice given to you by your health care provider. Make sure you discuss any questions you have with your health care provider. Document Released: 09/06/2007 Document Revised: 08/26/2015 Document Reviewed: 02/08/2015  2017 [employer]vier  

## 2016-05-22 NOTE — Addendum Note (Signed)
Addended by: Lurlean Nanny on: 05/22/2016 04:02 PM   Modules accepted: Orders

## 2016-05-22 NOTE — Progress Notes (Signed)
Subjective:    Patient ID: Rodney Suarez, male    DOB: 10-20-1940, 76 y.o.   MRN: [id number]  HPI  Pt presents to the clinic today with c/o blood in his urine. This has occurred 2 x over the last week. He denies urgency, frequency and dysuria. He denies fever, chills, nausea or low back pain. He has not tried anything OTC.  Review of Systems      Past Medical History:  Diagnosis Date  . Allergy   . BPH (benign prostatic hypertrophy)   . Diabetes mellitus   . ED (erectile dysfunction)   . History of prostate cancer   . Hx of adenomatous colonic polyps   . Hyperlipidemia   . Hypertension   . Osteoarthritis     Current Outpatient Prescriptions  Medication Sig Dispense Refill  . amLODipine (NORVASC) 10 MG tablet TAKE 1 TABLET BY MOUTH EVERY DAY 90 tablet 3  . aspirin EC 81 MG tablet Take 81 mg by mouth daily.    Marland Kitchen KLOR-CON M20 20 MEQ tablet Take 20 mEq by mouth daily.  1  . lisinopril-hydrochlorothiazide (PRINZIDE,ZESTORETIC) 20-12.5 MG tablet TAKE 2 TABLETS BY MOUTH EVERY DAY 180 tablet 3  . LORazepam (ATIVAN) 1 MG tablet TAKE ONE TO TWO TABLETS BY MOUTH AT BEDTIME AS NEEDED 60 tablet 0  . pravastatin (PRAVACHOL) 20 MG tablet TAKE 1 TABLET BY MOUTH EVERY DAY 90 tablet 1  . traMADol (ULTRAM) 50 MG tablet Take 1-2 tablets (50-100 mg total) by mouth every 4 (four) hours as needed for moderate pain. 60 tablet 0   No current facility-administered medications for this visit.     Allergies  Allergen Reactions  . Meloxicam Other (See Comments)    Cramps at night  . Metformin     REACTION: diarrhea    No family history on file.  Social History   Social History  . Marital status: Married    Spouse name: N/A  . Number of children: 2  . Years of education: N/A   Occupational History  . retired Retired    Armed forces logistics/support/administrative officer   Social History Main Topics  . Smoking status: Former Smoker    Quit date: 04/03/1982  . Smokeless tobacco: Never Used  . Alcohol use No  .  Drug use: No  . Sexual activity: Not on file   Other Topics Concern  . Not on file   Social History Narrative   No living will   Wife and daughter should be health care POAs   Discussed DNR--he requests this (done 10/14/12)   No tube feedings if cognitively unaware     Constitutional: Denies fever, malaise, fatigue, headache or abrupt weight changes.  GU: Pt reports blood in his urine. Denies urgency, frequency, pain with urination, burning sensation, odor or discharge.  No other specific complaints in a complete review of systems (except as listed in HPI above).  Objective:   Physical Exam   BP 130/70   Pulse 76   Temp 98.2 F (36.8 C) (Oral)   Wt 212 lb (96.2 kg)   SpO2 98%   BMI 29.99 kg/m  Wt Readings from Last 3 Encounters:  05/22/16 212 lb (96.2 kg)  04/04/16 194 lb 4 oz (88.1 kg)  03/06/16 210 lb (95.3 kg)    General: Appears his stated age, in NAD. Abdomen: Soft and nontender. Normal bowel sounds. No distention or masses noted. No CVA tenderness noted.   BMET    Component Value Date/Time  NA 133 (L) 03/08/2016 0302   K 3.7 03/08/2016 0302   CL 99 (L) 03/08/2016 0302   CO2 26 03/08/2016 0302   GLUCOSE 122 (H) 03/08/2016 0302   BUN 20 03/08/2016 0302   CREATININE 1.34 (H) 03/08/2016 0302   CALCIUM 8.5 (L) 03/08/2016 0302   GFRNONAA 49 (L) 03/08/2016 0302   GFRAA 57 (L) 03/08/2016 0302    Lipid Panel     Component Value Date/Time   CHOL 162 02/10/2016 1534   TRIG 96.0 02/10/2016 1534   HDL 56.60 02/10/2016 1534   CHOLHDL 3 02/10/2016 1534   VLDL 19.2 02/10/2016 1534   LDLCALC 86 02/10/2016 1534    CBC    Component Value Date/Time   WBC 6.3 03/08/2016 0302   RBC 3.92 (L) 03/08/2016 0302   HGB 13.2 03/08/2016 0302   HCT 37.9 (L) 03/08/2016 0302   PLT 133 (L) 03/08/2016 0302   MCV 96.7 03/08/2016 0302   MCH 33.7 03/08/2016 0302   MCHC 34.9 03/08/2016 0302   RDW 14.7 (H) 03/08/2016 0302   LYMPHSABS 1.9 02/10/2016 1534   MONOABS 0.4  02/10/2016 1534   EOSABS 0.2 02/10/2016 1534   BASOSABS 0.0 02/10/2016 1534    Hgb A1C Lab Results  Component Value Date   HGBA1C 6.4 (H) 02/23/2016           Assessment & Plan:   Hematuria secondary to UTI:  Urinalysis: 3+ leuks, pos nitrites, 1+ blood Will send urine culture eRx for Cipro 250 mg BID x 7 days Push fluids  RTC as needed or if symptoms persist or worsen Tylyn Stankovich, NP

## 2016-05-22 NOTE — Telephone Encounter (Signed)
Patient Name: Rodney Suarez DOB: 05/23/1940 Initial Comment caller states he has blood in urine Nurse Assessment Nurse: Jimmye Norman, RN, Whitney Date/Time (Eastern Time): 05/22/2016 12:07:18 PM Confirm and document reason for call. If symptomatic, describe symptoms. ---caller states he has blood in urine, he noticed it a month and half a go after he had knee surgery, then a week ago he had a little blood, and then yesterday had a little more than the previous times. denies any pain or fever Does the patient have any new or worsening symptoms? ---Yes Will a triage be completed? ---Yes Related visit to physician within the last 2 weeks? ---No Does the PT have any chronic conditions? (i.e. diabetes, asthma, etc.) ---Yes List chronic conditions. ---hypertension Is this a behavioral health or substance abuse call? ---No Guidelines Guideline Title Affirmed Question Affirmed Notes Urine - Blood In Blood in urine, but all triage questions negative (Exception: could be normal menstrual bleeding) Final Disposition User Go to ED Now (or PCP triage) Jimmye Norman, RN, Whitney Comments RN upgraded due to patients symptoms being present for a while. Appt scheduled for 3pm with Webb Silversmith Referrals REFERRED TO PCP OFFICE Disagree/Comply: Comply

## 2016-05-24 LAB — URINE CULTURE: ORGANISM ID, BACTERIA: NO GROWTH

## 2016-06-07 ENCOUNTER — Other Ambulatory Visit: Payer: Self-pay | Admitting: Internal Medicine

## 2016-06-07 NOTE — Telephone Encounter (Signed)
Last Rx 05/08/2016. Last f/u 02/2016-CPE

## 2016-06-07 NOTE — Telephone Encounter (Signed)
Rx called in as prescribed 

## 2016-06-07 NOTE — Telephone Encounter (Signed)
Px written for call in   1 mo refill in pcp absence

## 2016-07-21 ENCOUNTER — Other Ambulatory Visit: Payer: Self-pay | Admitting: Family Medicine

## 2016-07-21 NOTE — Telephone Encounter (Signed)
Last filled 06-07-16 #60 Last OV acute 05-22-16 [facility] 02-10-16 No Future OV

## 2016-07-22 NOTE — Telephone Encounter (Signed)
Approved: #60 x 0 

## 2016-07-24 NOTE — Telephone Encounter (Signed)
Left refill on voice mail at pharmacy  

## 2016-08-14 DIAGNOSIS — H40003 Preglaucoma, unspecified, bilateral: Secondary | ICD-10-CM | POA: Diagnosis not present

## 2016-09-07 ENCOUNTER — Other Ambulatory Visit: Payer: Self-pay | Admitting: Internal Medicine

## 2016-09-08 NOTE — Telephone Encounter (Signed)
Left refill on voice mail at pharmacy  

## 2016-09-08 NOTE — Telephone Encounter (Signed)
Approved: #60 x 0 

## 2016-09-08 NOTE — Telephone Encounter (Signed)
Last filled 07-24-16 #60 LAst OV Acute 05-22-16 LAst CPE 02-10-16 No Future OV   No UDS

## 2016-09-15 ENCOUNTER — Ambulatory Visit: Payer: PPO | Admitting: Internal Medicine

## 2016-09-18 ENCOUNTER — Ambulatory Visit (INDEPENDENT_AMBULATORY_CARE_PROVIDER_SITE_OTHER): Payer: PPO | Admitting: Internal Medicine

## 2016-09-18 ENCOUNTER — Encounter: Payer: Self-pay | Admitting: Internal Medicine

## 2016-09-18 VITALS — BP 154/92 | HR 80 | Temp 98.3°F | Wt 216.5 lb

## 2016-09-18 DIAGNOSIS — R21 Rash and other nonspecific skin eruption: Secondary | ICD-10-CM | POA: Insufficient documentation

## 2016-09-18 MED ORDER — TRIAMCINOLONE ACETONIDE 0.1 % EX CREA: 1.0000 "application " | TOPICAL_CREAM | Freq: Two times a day (BID) | CUTANEOUS | 1 refills | Status: DC | PRN

## 2016-09-18 NOTE — Assessment & Plan Note (Signed)
Some stasis changes Slightly raised rash seems different--but clearly not vascular or infectious Will try TAC Consider dermatologist if persists

## 2016-09-18 NOTE — Progress Notes (Signed)
   Subjective:    Patient ID: Rodney Suarez, male    DOB: 04/29/1940, 76 y.o.   MRN: [id number]  HPI Here due to severe left wrist pain Couldn't even move it Made appt--then it improved so canceled appt This is still okay Doesn't remember any injury Tried aleve  Now has rash on right leg--since knee surgery (6 months ago) Dark area over medial mid tibia Bad itching No ulcers or open areas ?slight similar rash on lower left calf als  Current Outpatient Prescriptions on File Prior to Visit  Medication Sig Dispense Refill  . amLODipine (NORVASC) 10 MG tablet TAKE 1 TABLET BY MOUTH EVERY DAY 90 tablet 3  . aspirin EC 81 MG tablet Take 81 mg by mouth daily.    Marland Kitchen KLOR-CON M20 20 MEQ tablet Take 20 mEq by mouth daily.  1  . lisinopril-hydrochlorothiazide (PRINZIDE,ZESTORETIC) 20-12.5 MG tablet TAKE 2 TABLETS BY MOUTH EVERY DAY 180 tablet 3  . LORazepam (ATIVAN) 1 MG tablet TAKE 1 TO 2 TABLETS BY MOUTH AT BEDTIME IF NEEDED FOR SLEEP 60 tablet 0  . pravastatin (PRAVACHOL) 20 MG tablet TAKE 1 TABLET BY MOUTH EVERY DAY 90 tablet 1   No current facility-administered medications on file prior to visit.     Allergies  Allergen Reactions  . Meloxicam Other (See Comments)    Cramps at night  . Metformin     REACTION: diarrhea    Past Medical History:  Diagnosis Date  . Allergy   . BPH (benign prostatic hypertrophy)   . Diabetes mellitus   . ED (erectile dysfunction)   . History of prostate cancer   . Hx of adenomatous colonic polyps   . Hyperlipidemia   . Hypertension   . Osteoarthritis     Past Surgical History:  Procedure Laterality Date  . APPENDECTOMY  1956  . FOOT FRACTURE SURGERY  1996  . KNEE ARTHROPLASTY Right 03/06/2016   Procedure: COMPUTER ASSISTED TOTAL KNEE ARTHROPLASTY;  Surgeon: Dereck Leep, MD;  Location: [facility] ORS;  Service: Orthopedics;  Laterality: Right;  . KNEE ARTHROSCOPY  11/2005   right knee  . PROSTATE SURGERY  01/2009   I-131 implants  . SINUS  SURGERY WITH INSTATRAK  09/2007    No family history on file.  Social History   Social History  . Marital status: Married    Spouse name: N/A  . Number of children: 2  . Years of education: N/A   Occupational History  . retired Retired    Armed forces logistics/support/administrative officer   Social History Main Topics  . Smoking status: Former Smoker    Quit date: 04/03/1982  . Smokeless tobacco: Never Used  . Alcohol use No  . Drug use: No  . Sexual activity: Not on file   Other Topics Concern  . Not on file   Social History Narrative   No living will   Wife and daughter should be health care POAs   Discussed DNR--he requests this (done 10/14/12)   No tube feedings if cognitively unaware   Review of Systems No fever Mild edema at right ankle    Objective:   Physical Exam  Musculoskeletal:  Right wrist--- no swelling or tenderness. Full active and passive ROM  Skin:  Scattered hyperpigmented areas on calves Slight raised brown circular areas (3) on medial right calf          Assessment & Plan:

## 2016-10-11 ENCOUNTER — Telehealth: Payer: Self-pay | Admitting: Internal Medicine

## 2016-10-11 NOTE — Telephone Encounter (Signed)
Patient Name: Rodney Suarez  DOB: March 27, 1941    Initial Comment Caller states the last couple of days he has been having chest pains, more on his right side. When he moves a certain way it grabs.    Nurse Assessment  Nurse: Thad Ranger RN, Denise Date/Time (Eastern Time): 10/11/2016 5:06:42 PM  Confirm and document reason for call. If symptomatic, describe symptoms. ---Caller states the last couple of days he has been having chest pains, more on his right side. When he moves a certain way it grabs. No injury  Does the patient have any new or worsening symptoms? ---Yes  Will a triage be completed? ---Yes  Related visit to physician within the last 2 weeks? ---No  Does the PT have any chronic conditions? (i.e. diabetes, asthma, etc.) ---Yes  List chronic conditions. ---HTN  Is this a behavioral health or substance abuse call? ---No     Guidelines    Guideline Title Affirmed Question Affirmed Notes  Chest Pain [1] Intermittent chest pain or "angina" AND [2] increasing in severity or frequency (Exception: pains lasting a few seconds)    Final Disposition User   Go to ED Now Carmon, RN, [employer] pt will will make a note in the chart and have someone call them back.  Per profile, called [facility] due to office hrs open till 1900. Msg received [facility] closed. Will send note to [facility] w/msg to [place] AM AS HE REFUSED ER AND WANTS TO SEE HIS MD 10/12/16.   Referrals  GO TO FACILITY OTHER - SPECIFY   Disagree/Comply: Disagree  Disagree/Comply Reason: Wait and see

## 2016-10-12 ENCOUNTER — Ambulatory Visit (INDEPENDENT_AMBULATORY_CARE_PROVIDER_SITE_OTHER): Payer: PPO | Admitting: Internal Medicine

## 2016-10-12 ENCOUNTER — Encounter: Payer: Self-pay | Admitting: Internal Medicine

## 2016-10-12 VITALS — BP 146/86 | HR 90 | Temp 98.8°F | Wt 212.0 lb

## 2016-10-12 DIAGNOSIS — R0789 Other chest pain: Secondary | ICD-10-CM | POA: Insufficient documentation

## 2016-10-12 DIAGNOSIS — R079 Chest pain, unspecified: Secondary | ICD-10-CM

## 2016-10-12 MED ORDER — TRAMADOL HCL 50 MG PO TABS: 50.0000 mg | ORAL_TABLET | Freq: Three times a day (TID) | ORAL | 0 refills | Status: DC | PRN

## 2016-10-12 NOTE — Progress Notes (Signed)
Subjective:    Patient ID: Rodney Suarez, male    DOB: Jul 22, 1940, 76 y.o.   MRN: [id number]  HPI Here due to chest pain  Noticed something in substernal area 4-5 days ago Not bad but then moved up towards left shoulder Tried some tylenol-- no clear help Achy type pain-- bad at times Constant--- worse if he turns or lies on left side  No SOB No diaphoresis No palpitations No edema other than transient right ankle edema since surgery  Doesn't remember any injury or recent heavy work  Current Outpatient Prescriptions on File Prior to Visit  Medication Sig Dispense Refill  . amLODipine (NORVASC) 10 MG tablet TAKE 1 TABLET BY MOUTH EVERY DAY 90 tablet 3  . aspirin EC 81 MG tablet Take 81 mg by mouth daily.    Marland Kitchen KLOR-CON M20 20 MEQ tablet Take 20 mEq by mouth daily.  1  . lisinopril-hydrochlorothiazide (PRINZIDE,ZESTORETIC) 20-12.5 MG tablet TAKE 2 TABLETS BY MOUTH EVERY DAY 180 tablet 3  . LORazepam (ATIVAN) 1 MG tablet TAKE 1 TO 2 TABLETS BY MOUTH AT BEDTIME IF NEEDED FOR SLEEP 60 tablet 0  . pravastatin (PRAVACHOL) 20 MG tablet TAKE 1 TABLET BY MOUTH EVERY DAY 90 tablet 1  . triamcinolone cream (KENALOG) 0.1 % Apply 1 application topically 2 (two) times daily as needed. 45 g 1   No current facility-administered medications on file prior to visit.     Allergies  Allergen Reactions  . Meloxicam Other (See Comments)    Cramps at night  . Metformin     REACTION: diarrhea    Past Medical History:  Diagnosis Date  . Allergy   . BPH (benign prostatic hypertrophy)   . Diabetes mellitus   . ED (erectile dysfunction)   . History of prostate cancer   . Hx of adenomatous colonic polyps   . Hyperlipidemia   . Hypertension   . Osteoarthritis     Past Surgical History:  Procedure Laterality Date  . APPENDECTOMY  1956  . FOOT FRACTURE SURGERY  1996  . KNEE ARTHROPLASTY Right 03/06/2016   Procedure: COMPUTER ASSISTED TOTAL KNEE ARTHROPLASTY;  Surgeon: Dereck Leep, MD;   Location: [facility] ORS;  Service: Orthopedics;  Laterality: Right;  . KNEE ARTHROSCOPY  11/2005   right knee  . PROSTATE SURGERY  01/2009   I-131 implants  . SINUS SURGERY WITH INSTATRAK  09/2007    No family history on file.  Social History   Social History  . Marital status: Married    Spouse name: N/A  . Number of children: 2  . Years of education: N/A   Occupational History  . retired Retired    Armed forces logistics/support/administrative officer   Social History Main Topics  . Smoking status: Former Smoker    Quit date: 04/03/1982  . Smokeless tobacco: Never Used  . Alcohol use No  . Drug use: No  . Sexual activity: Not on file   Other Topics Concern  . Not on file   Social History Narrative   No living will   Wife and daughter should be health care POAs   Discussed DNR--he requests this (done 10/14/12)   No tube feedings if cognitively unaware   Review of Systems No N'V Appetite is good    Objective:   Physical Exam  Neck: No JVD present. No thyromegaly present.  Cardiovascular: Normal rate, regular rhythm and normal heart sounds.  Exam reveals no gallop and no friction rub.   No murmur heard.  Pulmonary/Chest: Effort normal and breath sounds normal. No respiratory distress. He has no wheezes. He has no rales.  No sternal pain but does have pain leaning forward and palpating anterior left shoulder--as well as with shoulder ROM   Abdominal: Soft. He exhibits no distension. There is no tenderness. There is no rebound and no guarding.  Musculoskeletal:  Crepitus in left shoulder Fair ROM  Lymphadenopathy:    He has no cervical adenopathy.          Assessment & Plan:

## 2016-10-12 NOTE — Telephone Encounter (Signed)
Will see him today at his visit in office Right sided pain that is affected by movement is unlikely to be ischemic

## 2016-10-12 NOTE — Assessment & Plan Note (Signed)
He has several days of persistent and worsening pain in central chest and now to left shoulder EKG shows sinus rhythm at 82. No ischemic changes or hypertrophy---normal. This is unchanged since 02/23/16. Pain is not consistent with coronary ischemia Could be pericarditis but unlikely given lack of exam findings and the shoulder findings No lung findings or signs of infection Most likely related to shoulder/chest soft tissue. Also has left shoulder arthritis findings Will give several days of tramadol for the pain To ER only if severe pain, dyspnea ,etc

## 2016-10-17 ENCOUNTER — Other Ambulatory Visit: Payer: Self-pay | Admitting: Internal Medicine

## 2016-10-23 ENCOUNTER — Other Ambulatory Visit: Payer: Self-pay | Admitting: Internal Medicine

## 2016-10-23 NOTE — Telephone Encounter (Signed)
Last filled 09-08-16 #60 Last OV 10-12-16 Next OV 03-20-17  Forward to Dr Damita Dunnings in Dr Alla German absence

## 2016-10-24 NOTE — Telephone Encounter (Signed)
Please call in.  Thanks.   

## 2016-10-26 ENCOUNTER — Other Ambulatory Visit: Payer: Self-pay | Admitting: Internal Medicine

## 2016-10-27 NOTE — Telephone Encounter (Signed)
Left refill on voice mail at pharmacy  

## 2016-11-27 ENCOUNTER — Other Ambulatory Visit: Payer: Self-pay | Admitting: Internal Medicine

## 2016-11-27 NOTE — Telephone Encounter (Signed)
Last filled 10-27-16 Last OV Acute 10-12-16 Next OV 03-20-17

## 2016-11-27 NOTE — Telephone Encounter (Signed)
Approved: #60 x 0 

## 2016-11-27 NOTE — Telephone Encounter (Signed)
Left refill on voice mail at pharmacy  

## 2016-12-25 ENCOUNTER — Other Ambulatory Visit: Payer: Self-pay | Admitting: Internal Medicine

## 2016-12-25 NOTE — Telephone Encounter (Signed)
Rx called in to pharmacy. 

## 2016-12-25 NOTE — Telephone Encounter (Signed)
Approved: #60 x 0 

## 2016-12-25 NOTE — Telephone Encounter (Signed)
Last filled 11/27/16... Please advise

## 2017-01-18 ENCOUNTER — Ambulatory Visit: Payer: PPO

## 2017-01-28 ENCOUNTER — Other Ambulatory Visit: Payer: Self-pay | Admitting: Internal Medicine

## 2017-01-29 NOTE — Telephone Encounter (Signed)
Left refill on voice mail at pharmacy  

## 2017-01-29 NOTE — Telephone Encounter (Signed)
Last filled 12-27-16 #60 Last OV 10-12-16 Next OV 03-20-17

## 2017-01-29 NOTE — Telephone Encounter (Signed)
Approved: #60 x 0 

## 2017-02-06 DIAGNOSIS — H40003 Preglaucoma, unspecified, bilateral: Secondary | ICD-10-CM | POA: Diagnosis not present

## 2017-02-13 DIAGNOSIS — H40003 Preglaucoma, unspecified, bilateral: Secondary | ICD-10-CM | POA: Diagnosis not present

## 2017-02-13 LAB — HM DIABETES EYE EXAM

## 2017-02-15 ENCOUNTER — Encounter: Payer: Self-pay | Admitting: Internal Medicine

## 2017-02-28 ENCOUNTER — Other Ambulatory Visit: Payer: Self-pay | Admitting: Internal Medicine

## 2017-02-28 NOTE — Telephone Encounter (Signed)
Left refill on voice mail at pharmacy  

## 2017-02-28 NOTE — Telephone Encounter (Signed)
Last Rx 01/29/2017. Last OV 10/2016

## 2017-02-28 NOTE — Telephone Encounter (Signed)
Approved: #60 x 0 

## 2017-03-20 ENCOUNTER — Encounter: Payer: Self-pay | Admitting: Internal Medicine

## 2017-03-20 ENCOUNTER — Ambulatory Visit (INDEPENDENT_AMBULATORY_CARE_PROVIDER_SITE_OTHER): Payer: PPO | Admitting: Internal Medicine

## 2017-03-20 VITALS — BP 130/80 | HR 69 | Temp 98.2°F | Wt 218.2 lb

## 2017-03-20 DIAGNOSIS — E785 Hyperlipidemia, unspecified: Secondary | ICD-10-CM

## 2017-03-20 DIAGNOSIS — I10 Essential (primary) hypertension: Secondary | ICD-10-CM | POA: Diagnosis not present

## 2017-03-20 DIAGNOSIS — Z96651 Presence of right artificial knee joint: Secondary | ICD-10-CM | POA: Diagnosis not present

## 2017-03-20 DIAGNOSIS — Z23 Encounter for immunization: Secondary | ICD-10-CM

## 2017-03-20 DIAGNOSIS — E119 Type 2 diabetes mellitus without complications: Secondary | ICD-10-CM | POA: Diagnosis not present

## 2017-03-20 LAB — COMPREHENSIVE METABOLIC PANEL
ALBUMIN: 4.1 g/dL (ref 3.5–5.2)
ALK PHOS: 68 U/L (ref 39–117)
ALT: 20 U/L (ref 0–53)
AST: 27 U/L (ref 0–37)
BUN: 16 mg/dL (ref 6–23)
CO2: 30 mEq/L (ref 19–32)
Calcium: 9.1 mg/dL (ref 8.4–10.5)
Chloride: 101 mEq/L (ref 96–112)
Creatinine, Ser: 0.96 mg/dL (ref 0.40–1.50)
GFR: 97.36 mL/min (ref >60.00)
Glucose, Bld: 116 mg/dL — ABNORMAL HIGH (ref 70–99)
POTASSIUM: 4 meq/L (ref 3.5–5.1)
SODIUM: 137 meq/L (ref 135–145)
TOTAL PROTEIN: 7.3 g/dL (ref 6.0–8.3)
Total Bilirubin: 1.3 mg/dL — ABNORMAL HIGH (ref 0.2–1.2)

## 2017-03-20 LAB — CBC
HEMATOCRIT: 42 % (ref 39.0–52.0)
Hemoglobin: 14.3 g/dL (ref 13.0–17.0)
MCHC: 34.1 g/dL (ref 30.0–36.0)
MCV: 96.6 fl (ref 78.0–100.0)
PLATELETS: 209 10*3/uL (ref 150.0–400.0)
RBC: 4.35 Mil/uL (ref 4.22–5.81)
RDW: 14.1 % (ref 11.5–15.5)
WBC: 4 10*3/uL (ref 4.0–10.5)

## 2017-03-20 LAB — LIPID PANEL
CHOLESTEROL: 140 mg/dL (ref 0–200)
HDL: 56.4 mg/dL (ref >39.00)
LDL Cholesterol: 72 mg/dL (ref 0–99)
NONHDL: 83.81
TRIGLYCERIDES: 58 mg/dL (ref 0.0–149.0)
Total CHOL/HDL Ratio: 2
VLDL: 11.6 mg/dL (ref 0.0–40.0)

## 2017-03-20 LAB — HM DIABETES FOOT EXAM

## 2017-03-20 LAB — HEMOGLOBIN A1C: Hgb A1c MFr Bld: 6.4 % (ref 4.6–6.5)

## 2017-03-20 NOTE — Assessment & Plan Note (Signed)
No problems with statin 

## 2017-03-20 NOTE — Assessment & Plan Note (Signed)
BP Readings from Last 3 Encounters:  03/20/17 130/80  10/12/16 (!) 146/86  09/18/16 (!) 154/92   Good control Due for labs

## 2017-03-20 NOTE — Addendum Note (Signed)
Addended by: Modena Nunnery on: 03/20/2017 09:18 AM   Modules accepted: Orders

## 2017-03-20 NOTE — Assessment & Plan Note (Signed)
Finally over the recovery from TKR

## 2017-03-20 NOTE — Assessment & Plan Note (Signed)
Still seems to have excellent control without meds Check labs

## 2017-03-20 NOTE — Progress Notes (Signed)
Subjective:    Patient ID: Rodney Suarez, male    DOB: Feb 04, 1941, 76 y.o.   MRN: [id number]  HPI Here for follow up of diabetes and other chronic medical conditions  Tough time recovering from knee surgery Getting around well now Some stiffness--especially after yard work  Checks sugars most days Usually 100-110.  No hypoglycemic reactions No numbness or pain in feet Eats right  No chest pain No SOB No dizziness or syncope  No myalgias on on the statin  Current Outpatient Medications on File Prior to Visit  Medication Sig Dispense Refill  . amLODipine (NORVASC) 10 MG tablet TAKE 1 TABLET BY MOUTH EVERY DAY 90 tablet 3  . aspirin EC 81 MG tablet Take 81 mg by mouth daily.    Marland Kitchen KLOR-CON M20 20 MEQ tablet Take 20 mEq by mouth daily.  1  . lisinopril-hydrochlorothiazide (PRINZIDE,ZESTORETIC) 20-12.5 MG tablet TAKE 2 TABLETS BY MOUTH EVERY DAY 180 tablet 3  . LORazepam (ATIVAN) 1 MG tablet TAKE 1 TO 2 TABLETS BY MOUTH AT BEDTIME IF NEEDED FOR SLEEP 60 tablet 0  . pravastatin (PRAVACHOL) 20 MG tablet TAKE 1 TABLET BY MOUTH EVERY DAY 90 tablet 1  . triamcinolone cream (KENALOG) 0.1 % Apply 1 application topically 2 (two) times daily as needed. 45 g 1   No current facility-administered medications on file prior to visit.     Allergies  Allergen Reactions  . Meloxicam Other (See Comments)    Cramps at night  . Metformin     REACTION: diarrhea    Past Medical History:  Diagnosis Date  . Allergy   . BPH (benign prostatic hypertrophy)   . Diabetes mellitus   . ED (erectile dysfunction)   . History of prostate cancer   . Hx of adenomatous colonic polyps   . Hyperlipidemia   . Hypertension   . Osteoarthritis     Past Surgical History:  Procedure Laterality Date  . APPENDECTOMY  1956  . FOOT FRACTURE SURGERY  1996  . KNEE ARTHROPLASTY Right 03/06/2016   Procedure: COMPUTER ASSISTED TOTAL KNEE ARTHROPLASTY;  Surgeon: Dereck Leep, MD;  Location: [facility] ORS;  Service:  Orthopedics;  Laterality: Right;  . KNEE ARTHROSCOPY  11/2005   right knee  . PROSTATE SURGERY  01/2009   I-131 implants  . SINUS SURGERY WITH INSTATRAK  09/2007    No family history on file.  Social History   Socioeconomic History  . Marital status: Married    Spouse name: Not on file  . Number of children: 2  . Years of education: Not on file  . Highest education level: Not on file  Social Needs  . Financial resource strain: Not on file  . Food insecurity - worry: Not on file  . Food insecurity - inability: Not on file  . Transportation needs - medical: Not on file  . Transportation needs - non-medical: Not on file  Occupational History  . 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 Smoking status: Former Smoker    Last attempt to quit: 04/03/1982    Years since quitting: 34.9  . Smokeless tobacco: Never Used  Substance and Sexual Activity  . Alcohol use: No  . Drug use: No  . Sexual activity: Not on file  Other Topics Concern  . Not on file  Social History Narrative   No living will   Wife and daughter should be health care POAs  Discussed DNR--he requests this (done 10/14/12)   No tube feedings if cognitively unaware   Review of Systems Some trouble sleeping--uses the lorazepam rarely (1-2 per week at most) Weight is up a bit    Objective:   Physical Exam  Constitutional: He appears well-developed. No distress.  Neck: No thyromegaly present.  Cardiovascular: Normal rate, regular rhythm, normal heart sounds and intact distal pulses. Exam reveals no gallop.  No murmur heard. Pulmonary/Chest: Effort normal and breath sounds normal. No respiratory distress. He has no wheezes. He has no rales.  Abdominal: Soft. He exhibits no distension. There is no tenderness.  Lymphadenopathy:    He has no cervical adenopathy.  Neurological:  Normal sensation in feet  Skin: No rash noted.  No foot lesions  Psychiatric: He  has a normal mood and affect. His behavior is normal.          Assessment & Plan:

## 2017-03-21 ENCOUNTER — Encounter: Payer: Self-pay | Admitting: *Deleted

## 2017-04-03 ENCOUNTER — Other Ambulatory Visit: Payer: Self-pay | Admitting: Internal Medicine

## 2017-04-06 NOTE — Telephone Encounter (Signed)
Electronic refill request Last refill 02/28/17 #60 Last office visit 03/20/17

## 2017-04-06 NOTE — Telephone Encounter (Signed)
Approved: #60 x 0 

## 2017-04-09 NOTE — Telephone Encounter (Signed)
Left refill on voice mail at pharmacy  

## 2017-05-20 ENCOUNTER — Other Ambulatory Visit: Payer: Self-pay | Admitting: Internal Medicine

## 2017-05-23 ENCOUNTER — Other Ambulatory Visit: Payer: Self-pay | Admitting: Internal Medicine

## 2017-05-24 NOTE — Telephone Encounter (Signed)
Last filled: 04/09/17 #60 Last OV: 03/20/17 No future OV

## 2017-05-29 ENCOUNTER — Other Ambulatory Visit: Payer: Self-pay | Admitting: Internal Medicine

## 2017-07-16 ENCOUNTER — Other Ambulatory Visit: Payer: Self-pay | Admitting: Internal Medicine

## 2017-07-16 NOTE — Telephone Encounter (Signed)
Last Rx 05/24/17. Last OV 03/2017. pls advise

## 2017-07-16 NOTE — Telephone Encounter (Signed)
Last filled 05-24-17 #60 Last OV 03-20-17 No Future

## 2017-08-27 ENCOUNTER — Other Ambulatory Visit: Payer: Self-pay | Admitting: Internal Medicine

## 2017-08-28 NOTE — Telephone Encounter (Signed)
Last filled 07-16-17 #60 Last OV 03-20-17 No Future OV

## 2017-10-05 ENCOUNTER — Ambulatory Visit: Payer: Self-pay | Admitting: Internal Medicine

## 2017-10-05 NOTE — Telephone Encounter (Signed)
I returned his call.   He is c/o right shoulder pain that began about 3-4 days or a week ago.   Denies any injuries or new activities.   See triage notes.    The agent made him an appt to see Dr. Silvio Pate on 10/08/17 at 11:15.      Reason for Disposition . [1] MODERATE pain (e.g., interferes with normal activities) AND [2] present > 3 days  Answer Assessment - Initial Assessment Questions 1. ONSET: "When did the pain start?"     3-4 nights ago started hurting in right shoulder.   I have not done anything to injure it. 2. LOCATION: "Where is the pain located?"     In right shoulder where arm and shoulder meet. 3. PAIN: "How bad is the pain?" (Scale 1-10; or mild, moderate, severe)   - MILD (1-3): doesn't interfere with normal activities   - MODERATE (4-7): interferes with normal activities (e.G., work or school) or awakens from sleep   - SEVERE (8-10): excruciating pain, unable to do any normal activities, unable to move arm at all due to pain     8 on pain scale.   I try to raise it up but it hurts.   I am taking Aleve but after it wears off it hurts all over again.   Last night it hurt really bad.    4. WORK OR EXERCISE: "Has there been any recent work or exercise that involved this part of the body?"     No injuries.  I injured it several years while up on a ladder putting up Christmas decorations.   I jerked my arm and it hurt real bad in my shoulder.   I went to the ED after I jumped 6 feet off the ladder.   I felt it come out of the joint and it went back in by itself.   I landed on my feet when I jumped off the ladder.   I've had no problems with it since. 5. CAUSE: "What do you think is causing the shoulder pain?"     I don't know.  I haven't had any problems since that injury all those years ago. 6. OTHER SYMPTOMS: "Do you have any other symptoms?" (e.G., neck pain, swelling, rash, fever, numbness, weakness)     None of the above. 7. PREGNANCY: "Is there any chance you are pregnant?"  "When was your last menstrual period?"     N/A  Protocols used: SHOULDER PAIN-A-AH

## 2017-10-08 ENCOUNTER — Ambulatory Visit (INDEPENDENT_AMBULATORY_CARE_PROVIDER_SITE_OTHER): Payer: Medicare HMO | Admitting: Internal Medicine

## 2017-10-08 ENCOUNTER — Encounter: Payer: Self-pay | Admitting: Internal Medicine

## 2017-10-08 VITALS — BP 138/88 | HR 87 | Temp 97.6°F | Ht 73.0 in | Wt 219.0 lb

## 2017-10-08 DIAGNOSIS — M25511 Pain in right shoulder: Secondary | ICD-10-CM

## 2017-10-08 DIAGNOSIS — E119 Type 2 diabetes mellitus without complications: Secondary | ICD-10-CM

## 2017-10-08 LAB — POCT GLYCOSYLATED HEMOGLOBIN (HGB A1C): Hemoglobin A1C: 6.3 % — AB (ref 4.0–5.6)

## 2017-10-08 NOTE — Assessment & Plan Note (Addendum)
Overdue for visit for this Will check A1c Lab Results  Component Value Date   HGBA1C 6.3 (A) 10/08/2017   Excellent control--no changes needed

## 2017-10-08 NOTE — Progress Notes (Signed)
Subjective:    Patient ID: Rodney Suarez, male    DOB: [birth year], 77 y.o.   MRN: [id number]  HPI Here due to right shoulder pain  Many years ago--was up on ladder doing Christmas decorations Felt right shoulder dislocate---- shocked him and jumped off ladder Then it got back into place No sig problems with this till the past couple of weeks  Has tried some aleve No known injury Even worse for past 2-3 days Pain is worst in the bed---hard to lie on it Using aleve 2 ---mostly at bedtime No swelling  Current Outpatient Medications on File Prior to Visit  Medication Sig Dispense Refill  . amLODipine (NORVASC) 10 MG tablet TAKE 1 TABLET BY MOUTH EVERY DAY 90 tablet 3  . aspirin EC 81 MG tablet Take 81 mg by mouth daily.    Marland Kitchen KLOR-CON M20 20 MEQ tablet Take 20 mEq by mouth daily.  1  . lisinopril-hydrochlorothiazide (PRINZIDE,ZESTORETIC) 20-12.5 MG tablet TAKE 2 TABLETS BY MOUTH EVERY DAY 180 tablet 3  . LORazepam (ATIVAN) 1 MG tablet TAKE 1 TO 2 TABLETS BY MOUTH AT BEDTIME AS NEEDED FOR SLEEP 60 tablet 0  . pravastatin (PRAVACHOL) 20 MG tablet TAKE 1 TABLET BY MOUTH EVERY DAY 90 tablet 1  . triamcinolone cream (KENALOG) 0.1 % Apply 1 application topically 2 (two) times daily as needed. 45 g 1   No current facility-administered medications on file prior to visit.     Allergies  Allergen Reactions  . Meloxicam Other (See Comments)    Cramps at night  . Metformin     REACTION: diarrhea    Past Medical History:  Diagnosis Date  . Allergy   . BPH (benign prostatic hypertrophy)   . Diabetes mellitus   . ED (erectile dysfunction)   . History of prostate cancer   . Hx of adenomatous colonic polyps   . Hyperlipidemia   . Hypertension   . Osteoarthritis     Past Surgical History:  Procedure Laterality Date  . APPENDECTOMY  1956  . FOOT FRACTURE SURGERY  1996  . KNEE ARTHROPLASTY Right 03/06/2016   Procedure: COMPUTER ASSISTED TOTAL KNEE ARTHROPLASTY;  Surgeon: Dereck Leep, MD;  Location: [facility];  Service: Orthopedics;  Laterality: Right;  . KNEE ARTHROSCOPY  11/2005   right knee  . PROSTATE SURGERY  01/2009   I-131 implants  . SINUS SURGERY WITH INSTATRAK  09/2007    History reviewed. No pertinent family history.  Social History   Socioeconomic History  . Marital status: Married    Spouse name: Not on file  . Number of children: 2  . Years of education: Not on file  . Highest education level: Not on file  Occupational History  . 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 Financial resource strain: Not on file  . Food insecurity:    Worry: Not on file    Inability: Not on file  . Transportation needs:    Medical: Not on file    Non-medical: Not on file  Tobacco Use  . Smoking status: Former Smoker    Last attempt to quit: 04/03/1982    Years since quitting: 35.5  . Smokeless tobacco: Never Used  Substance and Sexual Activity  . Alcohol use: No  . Drug use: No  . Sexual activity: Not on file  Lifestyle  . Physical activity:    Days per week: Not on file  Minutes per session: Not on file  . Stress: Not on file  Relationships  . Social connections:    Talks on phone: Not on file    Gets together: Not on file    Attends religious service: Not on file    Active member of club or organization: Not on file    Attends meetings of clubs or organizations: Not on file    Relationship status: Not on file  . Intimate partner violence:    Fear of current or ex partner: Not on file    Emotionally abused: Not on file    Physically abused: Not on file    Forced sexual activity: Not on file  Other Topics Concern  . Not on file  Social History Narrative   No living will   Wife and daughter should be health care POAs   Discussed DNR--he requests this (done 10/14/12)   No tube feedings if cognitively unaware   Review of Systems  No other joint pain or swelling No fevers--not  sick     Objective:   Physical Exam  Constitutional: He appears well-developed. No distress.  Musculoskeletal:  Right shoulder is thickened ??slight anterior prominence Reduced abduction, internal and external rotation No point tenderness           Assessment & Plan:

## 2017-10-08 NOTE — Assessment & Plan Note (Signed)
May just be arthritic but had past dislocation and needs further assessment Will defer x-ray to orthopedist Can continue evening aleve

## 2017-10-21 ENCOUNTER — Other Ambulatory Visit: Payer: Self-pay | Admitting: Internal Medicine

## 2017-10-22 NOTE — Telephone Encounter (Signed)
Name of Medication: Lorazepam 1 mg Name of Pharmacy: [facility] or Written Date and Quantity: #60 08/28/17 Last Office Visit and Type: 10/08/17 shoulder pain Next Office Visit and Type: 04/10/18 CPX

## 2017-11-01 ENCOUNTER — Other Ambulatory Visit: Payer: Self-pay | Admitting: Internal Medicine

## 2017-11-29 ENCOUNTER — Other Ambulatory Visit: Payer: Self-pay | Admitting: Orthopedic Surgery

## 2017-11-29 DIAGNOSIS — M25511 Pain in right shoulder: Secondary | ICD-10-CM

## 2017-11-30 ENCOUNTER — Other Ambulatory Visit: Payer: Self-pay | Admitting: Internal Medicine

## 2017-12-04 NOTE — Telephone Encounter (Signed)
Ativan last filled 10/23/17 #60 Last OV 10/2017--Acute Next OV 04/2018 Last UDS 2015

## 2017-12-13 ENCOUNTER — Ambulatory Visit
Admission: RE | Admit: 2017-12-13 | Discharge: 2017-12-13 | Disposition: A | Discharge status: Home / Self Care | Payer: Medicare HMO | Source: Ambulatory Visit | Attending: Orthopedic Surgery | Admitting: Orthopedic Surgery

## 2017-12-13 DIAGNOSIS — M12811 Other specific arthropathies, not elsewhere classified, right shoulder: Secondary | ICD-10-CM | POA: Insufficient documentation

## 2017-12-13 DIAGNOSIS — M75121 Complete rotator cuff tear or rupture of right shoulder, not specified as traumatic: Secondary | ICD-10-CM | POA: Insufficient documentation

## 2017-12-13 DIAGNOSIS — M25511 Pain in right shoulder: Secondary | ICD-10-CM | POA: Insufficient documentation

## 2017-12-13 DIAGNOSIS — M659 Synovitis and tenosynovitis, unspecified: Secondary | ICD-10-CM | POA: Insufficient documentation

## 2018-01-11 ENCOUNTER — Other Ambulatory Visit: Payer: Self-pay | Admitting: Internal Medicine

## 2018-01-14 NOTE — Telephone Encounter (Signed)
Last office visit 10/08/2017 for Shoulder Pain. Next Appt: 04/10/2018 for [place].   UDS/Contract 10/22/2013. Last refilled 12/04/2017 for #60 with no refills.  Ok to refill?

## 2018-02-19 LAB — HM DIABETES EYE EXAM

## 2018-02-20 ENCOUNTER — Ambulatory Visit (INDEPENDENT_AMBULATORY_CARE_PROVIDER_SITE_OTHER): Payer: Medicare HMO | Admitting: Internal Medicine

## 2018-02-20 ENCOUNTER — Encounter: Payer: Self-pay | Admitting: Internal Medicine

## 2018-02-20 VITALS — BP 164/100 | HR 80 | Temp 98.6°F | Ht 73.0 in | Wt 221.0 lb

## 2018-02-20 DIAGNOSIS — L723 Sebaceous cyst: Secondary | ICD-10-CM | POA: Diagnosis not present

## 2018-02-20 DIAGNOSIS — I10 Essential (primary) hypertension: Secondary | ICD-10-CM | POA: Diagnosis not present

## 2018-02-20 NOTE — Assessment & Plan Note (Signed)
BP Readings from Last 3 Encounters:  02/20/18 (!) 164/100  10/08/17 138/88  03/20/17 130/80   High today situationally No problems when he checks at home No action for now

## 2018-02-20 NOTE — Assessment & Plan Note (Signed)
Reassured---no action needed unless bigger, pain or infected

## 2018-02-20 NOTE — Progress Notes (Signed)
Subjective:    Patient ID: Rodney Suarez, male    DOB: Feb 19, 1941, 77 y.o.   MRN: [id number]  HPI Here due to lumps he wants checked  Left occiput and posterior right shoulder Probably there for months or more No clear change but he has gotten concerned about them Checking them more No redness or tenderness  Current Outpatient Medications on File Prior to Visit  Medication Sig Dispense Refill  . amLODipine (NORVASC) 10 MG tablet TAKE 1 TABLET BY MOUTH EVERY DAY 90 tablet 3  . aspirin EC 81 MG tablet Take 81 mg by mouth daily.    Marland Kitchen KLOR-CON M20 20 MEQ tablet Take 20 mEq by mouth daily.  1  . lisinopril-hydrochlorothiazide (PRINZIDE,ZESTORETIC) 20-12.5 MG tablet TAKE 2 TABLETS BY MOUTH EVERY DAY 180 tablet 3  . LORazepam (ATIVAN) 1 MG tablet TAKE 1 TO 2 TABLETS BY MOUTH AT BEDTIME AS NEEDED FOR SLEEP 60 tablet 0  . pravastatin (PRAVACHOL) 20 MG tablet TAKE 1 TABLET BY MOUTH EVERY DAY 90 tablet 1   No current facility-administered medications on file prior to visit.     Allergies  Allergen Reactions  . Meloxicam Other (See Comments)    Cramps at night  . Metformin     REACTION: diarrhea    Past Medical History:  Diagnosis Date  . Allergy   . BPH (benign prostatic hypertrophy)   . Diabetes mellitus   . ED (erectile dysfunction)   . History of prostate cancer   . Hx of adenomatous colonic polyps   . Hyperlipidemia   . Hypertension   . Osteoarthritis     Past Surgical History:  Procedure Laterality Date  . APPENDECTOMY  1956  . FOOT FRACTURE SURGERY  1996  . KNEE ARTHROPLASTY Right 03/06/2016   Procedure: COMPUTER ASSISTED TOTAL KNEE ARTHROPLASTY;  Surgeon: Dereck Leep, MD;  Location: [facility];  Service: Orthopedics;  Laterality: Right;  . KNEE ARTHROSCOPY  11/2005   right knee  . PROSTATE SURGERY  01/2009   I-131 implants  . SINUS SURGERY WITH INSTATRAK  09/2007    History reviewed. No pertinent family history.  Social History   Socioeconomic History  .  Marital status: Married    Spouse name: Not on file  . Number of children: 2  . Years of education: Not on file  . Highest education level: Not on file  Occupational History  . 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 Financial resource strain: Not on file  . Food insecurity:    Worry: Not on file    Inability: Not on file  . Transportation needs:    Medical: Not on file    Non-medical: Not on file  Tobacco Use  . Smoking status: Former Smoker    Last attempt to quit: 04/03/1982    Years since quitting: 35.9  . Smokeless tobacco: Never Used  Substance and Sexual Activity  . Alcohol use: No  . Drug use: No  . Sexual activity: Not on file  Lifestyle  . Physical activity:    Days per week: Not on file    Minutes per session: Not on file  . Stress: Not on file  Relationships  . Social connections:    Talks on phone: Not on file    Gets together: Not on file    Attends religious service: Not on file    Active member of club or organization: Not on file  Attends meetings of clubs or organizations: Not on file    Relationship status: Not on file  . Intimate partner violence:    Fear of current or ex partner: Not on file    Emotionally abused: Not on file    Physically abused: Not on file    Forced sexual activity: Not on file  Other Topics Concern  . Not on file  Social History Narrative   No living will   Wife and daughter should be health care POAs   Discussed DNR--he requests this (done 10/14/12)   No tube feedings if cognitively unaware   Review of Systems  No fever Feels fine BP has been okay when checking at home     Objective:   Physical Exam  Skin:  Clearly circumscribed, freely moveable masses ---left occiput (near midline) and posterior right shoulder Both about 12-27mm across No redness or tenderness           Assessment & Plan:

## 2018-02-21 ENCOUNTER — Encounter: Payer: Self-pay | Admitting: Internal Medicine

## 2018-03-09 ENCOUNTER — Other Ambulatory Visit: Payer: Self-pay | Admitting: Internal Medicine

## 2018-03-11 NOTE — Telephone Encounter (Signed)
Last office visit 02/20/2018 for cyst.  Next Appt: 04/10/2018 for AWV.    UDS/Contract 10/22/2013.  Last filled 01/11/2018 for #60 with no refills.    Please advise

## 2018-04-08 ENCOUNTER — Telehealth: Payer: Self-pay

## 2018-04-08 NOTE — Telephone Encounter (Signed)
I have not called him and there are no encounters showing why we would have called.

## 2018-04-08 NOTE — Telephone Encounter (Signed)
[facility] faxed note 04/05/18 that pt was returning call.

## 2018-04-10 ENCOUNTER — Ambulatory Visit (INDEPENDENT_AMBULATORY_CARE_PROVIDER_SITE_OTHER): Payer: Medicare HMO | Admitting: Internal Medicine

## 2018-04-10 ENCOUNTER — Encounter: Payer: Self-pay | Admitting: Internal Medicine

## 2018-04-10 VITALS — BP 154/98 | HR 88 | Temp 98.4°F | Ht 71.0 in | Wt 221.0 lb

## 2018-04-10 DIAGNOSIS — I10 Essential (primary) hypertension: Secondary | ICD-10-CM

## 2018-04-10 DIAGNOSIS — R413 Other amnesia: Secondary | ICD-10-CM | POA: Diagnosis not present

## 2018-04-10 DIAGNOSIS — Z0000 Encounter for general adult medical examination without abnormal findings: Secondary | ICD-10-CM | POA: Diagnosis not present

## 2018-04-10 DIAGNOSIS — E119 Type 2 diabetes mellitus without complications: Secondary | ICD-10-CM

## 2018-04-10 DIAGNOSIS — E785 Hyperlipidemia, unspecified: Secondary | ICD-10-CM

## 2018-04-10 DIAGNOSIS — Z7189 Other specified counseling: Secondary | ICD-10-CM

## 2018-04-10 LAB — COMPREHENSIVE METABOLIC PANEL
ALT: 19 U/L (ref 0–53)
AST: 25 U/L (ref 0–37)
Albumin: 4.3 g/dL (ref 3.5–5.2)
Alkaline Phosphatase: 63 U/L (ref 39–117)
BILIRUBIN TOTAL: 1.9 mg/dL — AB (ref 0.2–1.2)
BUN: 13 mg/dL (ref 6–23)
CO2: 31 meq/L (ref 19–32)
CREATININE: 0.94 mg/dL (ref 0.40–1.50)
Calcium: 9.3 mg/dL (ref 8.4–10.5)
Chloride: 100 mEq/L (ref 96–112)
GFR: 99.48 mL/min (ref >60.00)
Glucose, Bld: 144 mg/dL — ABNORMAL HIGH (ref 70–99)
Potassium: 3.4 mEq/L — ABNORMAL LOW (ref 3.5–5.1)
SODIUM: 138 meq/L (ref 135–145)
Total Protein: 7.3 g/dL (ref 6.0–8.3)

## 2018-04-10 LAB — LIPID PANEL
CHOL/HDL RATIO: 3
Cholesterol: 195 mg/dL (ref 0–200)
HDL: 60.8 mg/dL (ref >39.00)
LDL CALC: 117 mg/dL — AB (ref 0–99)
NONHDL: 134.58
Triglycerides: 88 mg/dL (ref 0.0–149.0)
VLDL: 17.6 mg/dL (ref 0.0–40.0)

## 2018-04-10 LAB — HEMOGLOBIN A1C: HEMOGLOBIN A1C: 6 % (ref 4.6–6.5)

## 2018-04-10 LAB — CBC
HEMATOCRIT: 45.5 % (ref 39.0–52.0)
Hemoglobin: 15.5 g/dL (ref 13.0–17.0)
MCHC: 34.1 g/dL (ref 30.0–36.0)
MCV: 97.4 fl (ref 78.0–100.0)
Platelets: 165 10*3/uL (ref 150.0–400.0)
RBC: 4.67 Mil/uL (ref 4.22–5.81)
RDW: 14.7 % (ref 11.5–15.5)
WBC: 4.3 10*3/uL (ref 4.0–10.5)

## 2018-04-10 LAB — T4, FREE: Free T4: 0.85 ng/dL (ref 0.60–1.60)

## 2018-04-10 LAB — HM DIABETES FOOT EXAM

## 2018-04-10 LAB — VITAMIN B12: Vitamin B-12: 230 pg/mL (ref 211–911)

## 2018-04-10 LAB — SEDIMENTATION RATE: SED RATE: 8 mm/h (ref 0–20)

## 2018-04-10 IMAGING — DX DG KNEE 1-2V PORT*R*
2 series · 2 of 2 positions shown · non-contrast
Comparison: 10/10/2005 .

CLINICAL DATA: Knee replacement.

EXAM:
PORTABLE RIGHT KNEE - 1-2 VIEW

[knee lat (1 of 2)]
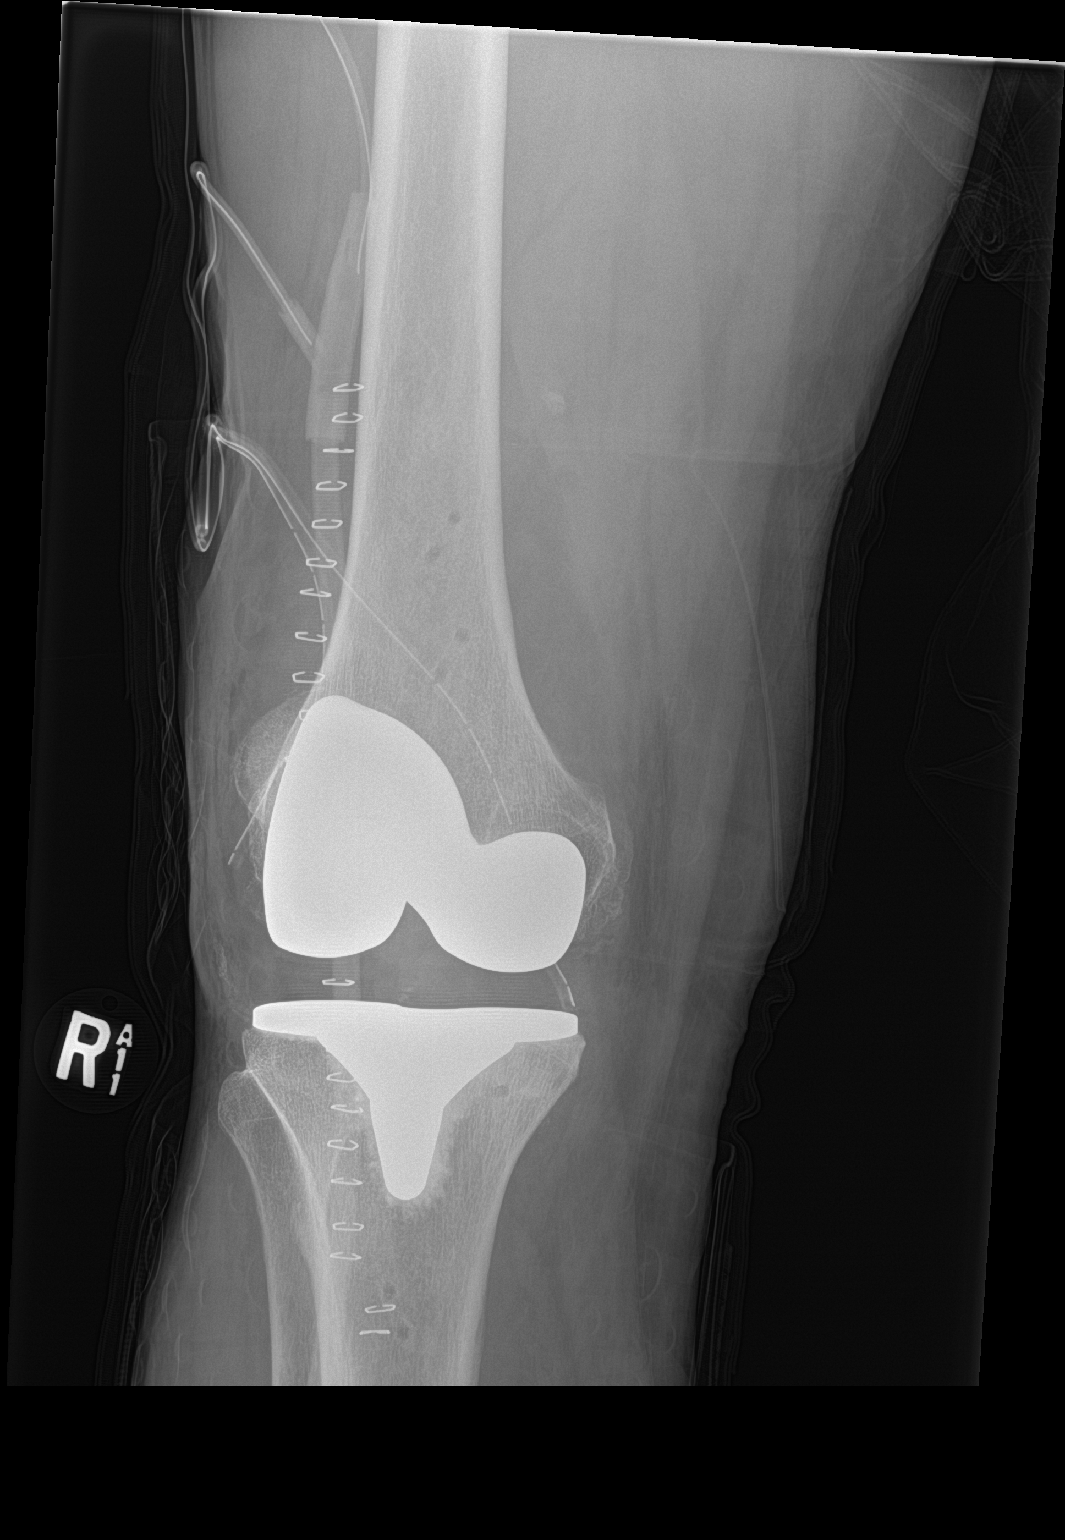

[knee lat (2 of 2)]
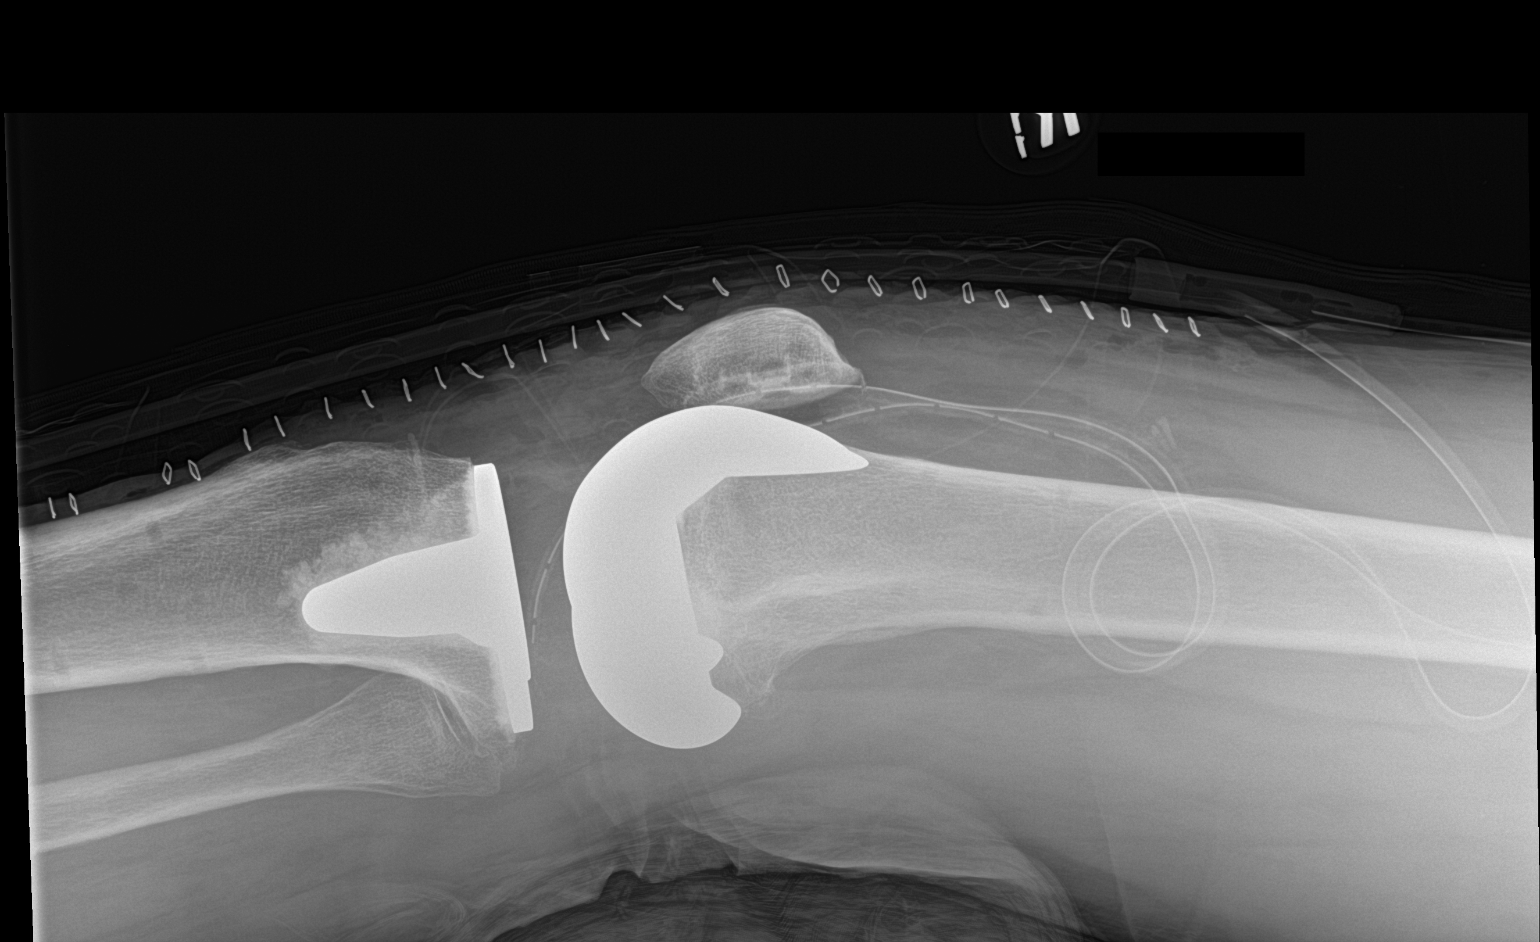

[2 of 2 positions shown; findings below may reference images not displayed]

FINDINGS: Total right knee replacement. Hardware intact. Anatomic alignment.
No acute abnormality.
IMPRESSION: Total right knee replacement.  Anatomic alignment noted.

## 2018-04-10 NOTE — Assessment & Plan Note (Signed)
I have personally reviewed the Medicare Annual Wellness questionnaire and have noted 1. The patient's medical and social history 2. Their use of alcohol, tobacco or illicit drugs 3. Their current medications and supplements 4. The patient's functional ability including ADL's, fall risks, home safety risks and hearing or visual             impairment. 5. Diet and physical activities 6. Evidence for depression or mood disorders  The patients weight, height, BMI and visual acuity have been recorded in the chart I have made referrals, counseling and provided education to the patient based review of the above and I have provided the pt with a written personalized care plan for preventive services.  I have provided you with a copy of your personalized plan for preventive services. Please take the time to review along with your updated medication list.  Discussed fitness Td booster if skin break, etc Consider shingrix No cancer screening due to age

## 2018-04-10 NOTE — Assessment & Plan Note (Signed)
No problems with statin Will try off to be sure no problems with memory

## 2018-04-10 NOTE — Assessment & Plan Note (Signed)
BP Readings from Last 3 Encounters:  04/10/18 (!) 154/98  02/20/18 (!) 164/100  10/08/17 138/88   Chronic white coat HTN Will have him check and see back sooner

## 2018-04-10 NOTE — Progress Notes (Signed)
Hearing Screening   Method: Audiometry   125Hz  250Hz  500Hz  1000Hz  2000Hz  3000Hz  4000Hz  6000Hz  8000Hz   Right ear:   20 20 20  20     Left ear:   20 20 20  20     Vision Screening Comments: November 2019

## 2018-04-10 NOTE — Assessment & Plan Note (Signed)
Has DNR 

## 2018-04-10 NOTE — Assessment & Plan Note (Signed)
Likely MCI Will check labs Recheck 3 months

## 2018-04-10 NOTE — Progress Notes (Signed)
Subjective:    Patient ID: Rodney Suarez, male    DOB: 21-Oct-1940, 78 y.o.   MRN: [id number]  HPI Here for Medicare wellness visit and follow up of chronic health conditions Reviewed form and advanced directives Reviewed other doctors Cut back on exercise---got pain in right knee when he tried jogging. 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 amLODipine (NORVASC) 10 MG tablet TAKE 1 TABLET BY MOUTH EVERY DAY 90 tablet 3  . aspirin EC 81 MG tablet Take 81 mg by mouth daily.    Marland Kitchen KLOR-CON M20 20 MEQ tablet Take 20 mEq by mouth daily.  1  . lisinopril-hydrochlorothiazide (PRINZIDE,ZESTORETIC) 20-12.5 MG tablet TAKE 2 TABLETS BY MOUTH EVERY DAY 180 tablet 3  . LORazepam (ATIVAN) 1 MG tablet TAKE 1 TO 2 TABLETS BY MOUTH AT BEDTIME AS NEEDED FOR SLEEP 60 tablet 0  . pravastatin (PRAVACHOL) 20 MG tablet TAKE 1 TABLET BY MOUTH EVERY DAY 90 tablet 1   No current facility-administered medications on file prior to visit.     Allergies  Allergen Reactions  . Meloxicam Other (See Comments)    Cramps at night  . Metformin     REACTION: diarrhea    Past Medical History:  Diagnosis Date  . Allergy   . BPH (benign prostatic hypertrophy)   . Diabetes mellitus   .  ED (erectile dysfunction)   . History of prostate cancer   . Hx of adenomatous colonic polyps   . Hyperlipidemia   . Hypertension   . Osteoarthritis     Past Surgical History:  Procedure Laterality Date  . APPENDECTOMY  1956  . FOOT FRACTURE SURGERY  1996  . KNEE ARTHROPLASTY Right 03/06/2016   Procedure: COMPUTER ASSISTED TOTAL KNEE ARTHROPLASTY;  Surgeon: Dereck Leep, MD;  Location: [facility] OR[facility];  Service: Orthopedics;  Laterality: Right;  . KNEE ARTHROSCOPY  11/2005   right knee  . PROSTATE SURGERY  01/2009   I-131 implants  . SINUS SURGERY WITH INSTATRAK  09/2007    History reviewed. No pertinent family history.  Social History   Socioeconomic History  . Marital status: Married    Spouse name: Not on file  . Number of children: 2  . Years of education: Not on file  . Highest education level: Not on file  Occupational History  . 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 Financial resource strain: Not on file  . Food insecurity:    Worry: Not on file    Inability: Not on file  . Transportation needs:    Medical: Not on file    Non-medical: Not on file  Tobacco Use  . Smoking status: Former Smoker    Last attempt to quit:  04/03/1982    Years since quitting: 36.0  . Smokeless tobacco: Never Used  Substance and Sexual Activity  . Alcohol use: No  . Drug use: No  . Sexual activity: Not on file  Lifestyle  . Physical activity:    Days per week: Not on file    Minutes per session: Not on file  . Stress: Not on file  Relationships  . Social connections:    Talks on phone: Not on file    Gets together: Not on file    Attends religious service: Not on file    Active member of club or organization: Not on file    Attends meetings of clubs or organizations: Not on file    Relationship status: Not on file  . Intimate partner violence:    Fear of current or ex partner: Not on file    Emotionally abused: Not on  file    Physically abused: Not on file    Forced sexual activity: Not on file  Other Topics Concern  . Not on file  Social History Narrative   No living will   Wife and daughter should be health care POAs   Discussed DNR--he requests this (done 10/14/12)   No tube feedings if cognitively unaware   Review of Systems  Appetite is good Weight is back down again Wears seat belt Teeth okay---keeps up with dentist No rash or suspicious skin lesions Bowels are fine--no blood No heartburn or dysphagia No sig back pain     Objective:   Physical Exam  Constitutional: He is oriented to person, place, and time. He appears well-developed. No distress.  HENT:  Mouth/Throat: Oropharynx is clear and moist. No oropharyngeal exudate.  Neck: No thyromegaly present.  Cardiovascular: Normal rate, regular rhythm, normal heart sounds and intact distal pulses. Exam reveals no gallop.  No murmur heard. Respiratory: Effort normal and breath sounds normal. No respiratory distress. He has no wheezes. He has no rales.  GI: Soft. There is no abdominal tenderness.  Musculoskeletal:        General: No tenderness or edema.  Lymphadenopathy:    He has no cervical adenopathy.  Neurological: He is alert and oriented to person, place, and time.  President--- "Daisy Floro, Obama, Clinton---Bush" 100-93-? Can't spell backwards Recall ?  Normal sensation in feet  Skin:  No foot lesions  Psychiatric: His behavior is normal.  A little flustered with the memory testing           Assessment & Plan:

## 2018-04-10 NOTE — Assessment & Plan Note (Signed)
Still seems to have good control 

## 2018-04-10 NOTE — Patient Instructions (Signed)
[facility] once or twice a month--and write it down. Stop the pravastatin for a moth to see if that helps your memory. Bring your wife in next time if she has concerns about your memory

## 2018-04-16 ENCOUNTER — Encounter: Payer: Self-pay | Admitting: Internal Medicine

## 2018-04-16 ENCOUNTER — Ambulatory Visit (INDEPENDENT_AMBULATORY_CARE_PROVIDER_SITE_OTHER): Payer: Medicare HMO | Admitting: Internal Medicine

## 2018-04-16 VITALS — BP 134/88 | HR 73 | Temp 98.2°F | Ht 71.0 in | Wt 223.0 lb

## 2018-04-16 DIAGNOSIS — R252 Cramp and spasm: Secondary | ICD-10-CM

## 2018-04-16 MED ORDER — KLOR-CON M20 20 MEQ PO TBCR: 20.0000 meq | EXTENDED_RELEASE_TABLET | Freq: Two times a day (BID) | ORAL | 11 refills | Status: DC

## 2018-04-16 NOTE — Patient Instructions (Signed)
Please take the potassium twice a day now. Add an over the counter magnesium pill (like 400mg ) You can try mustard &/or tonic water at bedtime as well.

## 2018-04-16 NOTE — Assessment & Plan Note (Signed)
Not a new thing but more troublesome recently Discussed that it is non specific and common Will double the potassium Add magnesium He has tried some vinegar Discussed tonic water

## 2018-04-16 NOTE — Progress Notes (Signed)
Subjective:    Patient ID: Rodney Suarez, male    DOB: 15-Oct-1940, 78 y.o.   MRN: [id number]  HPI Here due to recurrence of leg cramps  6-8 weeks ago--had bad leg cramps Would have to get up and walk around [place] away but is flaring up again  No daytime symptoms No clear myalgias  Still having some memory issues Did stop the statin for now  Current Outpatient Medications on File Prior to Visit  Medication Sig Dispense Refill  . amLODipine (NORVASC) 10 MG tablet TAKE 1 TABLET BY MOUTH EVERY DAY 90 tablet 3  . aspirin EC 81 MG tablet Take 81 mg by mouth daily.    Marland Kitchen KLOR-CON M20 20 MEQ tablet Take 20 mEq by mouth daily.  1  . lisinopril-hydrochlorothiazide (PRINZIDE,ZESTORETIC) 20-12.5 MG tablet TAKE 2 TABLETS BY MOUTH EVERY DAY 180 tablet 3  . LORazepam (ATIVAN) 1 MG tablet TAKE 1 TO 2 TABLETS BY MOUTH AT BEDTIME AS NEEDED FOR SLEEP 60 tablet 0  . pravastatin (PRAVACHOL) 20 MG tablet TAKE 1 TABLET BY MOUTH EVERY DAY 90 tablet 1   No current facility-administered medications on file prior to visit.     Allergies  Allergen Reactions  . Meloxicam Other (See Comments)    Cramps at night  . Metformin     REACTION: diarrhea    Past Medical History:  Diagnosis Date  . Allergy   . BPH (benign prostatic hypertrophy)   . Diabetes mellitus   . ED (erectile dysfunction)   . History of prostate cancer   . Hx of adenomatous colonic polyps   . Hyperlipidemia   . Hypertension   . Osteoarthritis     Past Surgical History:  Procedure Laterality Date  . APPENDECTOMY  1956  . FOOT FRACTURE SURGERY  1996  . KNEE ARTHROPLASTY Right 03/06/2016   Procedure: COMPUTER ASSISTED TOTAL KNEE ARTHROPLASTY;  Surgeon: Dereck Leep, MD;  Location: [facility];  Service: Orthopedics;  Laterality: Right;  . KNEE ARTHROSCOPY  11/2005   right knee  . PROSTATE SURGERY  01/2009   I-131 implants  . SINUS SURGERY WITH INSTATRAK  09/2007    History reviewed. No pertinent family history.  Social  History   Socioeconomic History  . Marital status: Married    Spouse name: Not on file  . Number of children: 2  . Years of education: Not on file  . Highest education level: Not on file  Occupational History  . 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 Financial resource strain: Not on file  . Food insecurity:    Worry: Not on file    Inability: Not on file  . Transportation needs:    Medical: Not on file    Non-medical: Not on file  Tobacco Use  . Smoking status: Former Smoker    Last attempt to quit: 04/03/1982    Years since quitting: 36.0  . Smokeless tobacco: Never Used  Substance and Sexual Activity  . Alcohol use: No  . Drug use: No  . Sexual activity: Not on file  Lifestyle  . Physical activity:    Days per week: Not on file    Minutes per session: Not on file  . Stress: Not on file  Relationships  . Social connections:    Talks on phone: Not on file    Gets together: Not on file    Attends religious service: Not on file  Active member of club or organization: Not on file    Attends meetings of clubs or organizations: Not on file    Relationship status: Not on file  . Intimate partner violence:    Fear of current or ex partner: Not on file    Emotionally abused: Not on file    Physically abused: Not on file    Forced sexual activity: Not on file  Other Topics Concern  . Not on file  Social History Narrative   No living will   Wife and daughter should be health care POAs   Discussed DNR--he requests this (done 10/14/12)   No tube feedings if cognitively unaware   Review of Systems  Eating well Keeps up with fluids--lots of water     Objective:   Physical Exam  Constitutional: He appears well-developed. No distress.  Musculoskeletal:        General: No edema.     Comments: No calf tenderness or swelling           Assessment & Plan:

## 2018-04-21 ENCOUNTER — Other Ambulatory Visit: Payer: Self-pay | Admitting: Internal Medicine

## 2018-04-22 NOTE — Telephone Encounter (Signed)
Last filled 03-11-18 #60 Last OV 04-10-18 Next OV 10-15-18 [facility] [address] [address]

## 2018-04-30 ENCOUNTER — Other Ambulatory Visit: Payer: Self-pay | Admitting: Internal Medicine

## 2018-06-07 ENCOUNTER — Other Ambulatory Visit: Payer: Self-pay | Admitting: Internal Medicine

## 2018-06-07 NOTE — Telephone Encounter (Signed)
Last filled 04-22-18 #60 Last OV 04-10-18 Next OV 10-15-18 [facility] [address] [address]

## 2018-07-05 ENCOUNTER — Other Ambulatory Visit: Payer: Self-pay | Admitting: Internal Medicine

## 2018-07-08 NOTE — Telephone Encounter (Signed)
Last filled 06/07/2018 #60 Last OV 04-10-18 Next OV 10-15-18 [facility] [address] [address]

## 2018-08-16 ENCOUNTER — Other Ambulatory Visit: Payer: Self-pay | Admitting: Internal Medicine

## 2018-08-16 NOTE — Telephone Encounter (Signed)
Last filled 07-08-18 #60 Last OV 04-10-18 Next OV 10-15-18 [facility] [address] [address]

## 2018-08-19 ENCOUNTER — Other Ambulatory Visit: Payer: Self-pay | Admitting: Internal Medicine

## 2018-09-17 ENCOUNTER — Other Ambulatory Visit: Payer: Self-pay | Admitting: Internal Medicine

## 2018-09-17 NOTE — Telephone Encounter (Signed)
Last filled 08-17-18 #60 Last OV 04-10-18 Next OV 10-15-18 [facility] [address] [address]

## 2018-10-11 ENCOUNTER — Other Ambulatory Visit: Payer: Self-pay | Admitting: Internal Medicine

## 2018-10-15 ENCOUNTER — Encounter: Payer: Self-pay | Admitting: Internal Medicine

## 2018-10-15 ENCOUNTER — Other Ambulatory Visit: Payer: Self-pay

## 2018-10-15 ENCOUNTER — Ambulatory Visit (INDEPENDENT_AMBULATORY_CARE_PROVIDER_SITE_OTHER): Payer: Medicare HMO | Admitting: Internal Medicine

## 2018-10-15 DIAGNOSIS — R413 Other amnesia: Secondary | ICD-10-CM | POA: Diagnosis not present

## 2018-10-15 DIAGNOSIS — I10 Essential (primary) hypertension: Secondary | ICD-10-CM

## 2018-10-15 DIAGNOSIS — E119 Type 2 diabetes mellitus without complications: Secondary | ICD-10-CM | POA: Diagnosis not present

## 2018-10-15 LAB — POCT GLYCOSYLATED HEMOGLOBIN (HGB A1C): Hemoglobin A1C: 6.2 % — AB (ref 4.0–5.6)

## 2018-10-15 NOTE — Progress Notes (Signed)
Subjective:    Patient ID: Rodney Suarez, male    DOB: 04/10/1940, 78 y.o.   MRN: [id number]  HPI Here for follow up of diabetes and other chronic health conditions  He continues to have some trouble with his memory Still forgetful Family doesn't voice concern No change in function  Checking sugar most every day Usually 90-110 No low sugars No foot pain, tingling or numbness  No chest pain No SOB No change in exercise tolerance No edema  Current Outpatient Medications on File Prior to Visit  Medication Sig Dispense Refill  . amLODipine (NORVASC) 10 MG tablet TAKE 1 TABLET BY MOUTH EVERY DAY 90 tablet 3  . aspirin EC 81 MG tablet Take 81 mg by mouth daily.    Marland Kitchen KLOR-CON M20 20 MEQ tablet Take 1 tablet (20 mEq total) by mouth 2 (two) times daily. 60 tablet 11  . lisinopril-hydrochlorothiazide (ZESTORETIC) 20-12.5 MG tablet TAKE 2 TABLETS BY MOUTH EVERY DAY 180 tablet 0  . LORazepam (ATIVAN) 1 MG tablet TAKE 1 TO 2 TABLETS BY MOUTH AT BEDTIME AS NEEDED FOR SLEEP 60 tablet 0  . pravastatin (PRAVACHOL) 20 MG tablet TAKE 1 TABLET BY MOUTH EVERY DAY 90 tablet 3   No current facility-administered medications on file prior to visit.     Allergies  Allergen Reactions  . Meloxicam Other (See Comments)    Cramps at night  . Metformin     REACTION: diarrhea    Past Medical History:  Diagnosis Date  . Allergy   . BPH (benign prostatic hypertrophy)   . Diabetes mellitus   . ED (erectile dysfunction)   . History of prostate cancer   . Hx of adenomatous colonic polyps   . Hyperlipidemia   . Hypertension   . Osteoarthritis     Past Surgical History:  Procedure Laterality Date  . APPENDECTOMY  1956  . FOOT FRACTURE SURGERY  1996  . KNEE ARTHROPLASTY Right 03/06/2016   Procedure: COMPUTER ASSISTED TOTAL KNEE ARTHROPLASTY;  Surgeon: Dereck Leep, MD;  Location: [facility] OR[facility];  Service: Orthopedics;  Laterality: Right;  . KNEE ARTHROSCOPY  11/2005   right knee  . PROSTATE  SURGERY  01/2009   I-131 implants  . SINUS SURGERY WITH INSTATRAK  09/2007    History reviewed. No pertinent family history.  Social History   Socioeconomic History  . Marital status: Married    Spouse name: Not on file  . Number of children: 2  . Years of education: Not on file  . Highest education level: Not on file  Occupational History  . 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 Financial resource strain: Not on file  . Food insecurity    Worry: Not on file    Inability: Not on file  . Transportation needs    Medical: Not on file    Non-medical: Not on file  Tobacco Use  . Smoking status: Former Smoker    Quit date: 04/03/1982    Years since quitting: 36.5  . Smokeless tobacco: Never Used  Substance and Sexual Activity  . Alcohol use: No  . Drug use: No  . Sexual activity: Not on file  Lifestyle  . Physical activity    Days per week: Not on file    Minutes per session: Not on file  . Stress: Not on file  Relationships  . Social connections    Talks on phone: Not on file  Gets together: Not on file    Attends religious service: Not on file    Active member of club or organization: Not on file    Attends meetings of clubs or organizations: Not on file    Relationship status: Not on file  . Intimate partner violence    Fear of current or ex partner: Not on file    Emotionally abused: Not on file    Physically abused: Not on file    Forced sexual activity: Not on file  Other Topics Concern  . Not on file  Social History Narrative   No living will   Wife and daughter should be health care POAs   Discussed DNR--he requests this (done 10/14/12)   No tube feedings if cognitively unaware   Review of Systems Appetite is good Weight is stable Sleeps well    Objective:   Physical Exam  Constitutional: He appears well-developed. No distress.  Neck: No thyromegaly present.  Cardiovascular: Normal rate,  regular rhythm, normal heart sounds and intact distal pulses. Exam reveals no gallop.  No murmur heard. Respiratory: Effort normal and breath sounds normal. No respiratory distress. He has no wheezes. He has no rales.  Musculoskeletal:        General: No edema.  Lymphadenopathy:    He has no cervical adenopathy.  Skin:  No foot lesions  Psychiatric: He has a normal mood and affect. His behavior is normal.           Assessment & Plan:

## 2018-10-15 NOTE — Patient Instructions (Signed)
Please don't take the cholesterol pill--pravastatin--for 1-2 months. If your memory seems to be better, stay off it. If you don't notice any change, please restart it.

## 2018-10-15 NOTE — Assessment & Plan Note (Addendum)
Still seems to have good control Will check A1c  Lab Results  Component Value Date   HGBA1C 6.2 (A) 10/15/2018   No changes needed

## 2018-10-15 NOTE — Assessment & Plan Note (Signed)
Has not had any functional decline in the past 6 months Will have him try off the statin for 1-2 months to see if that makes any difference

## 2018-10-15 NOTE — Assessment & Plan Note (Signed)
BP Readings from Last 3 Encounters:  10/15/18 140/86  04/16/18 134/88  04/10/18 (!) 154/98   Fairly good control No changes needed

## 2018-10-17 ENCOUNTER — Other Ambulatory Visit: Payer: Self-pay | Admitting: Internal Medicine

## 2018-10-18 NOTE — Telephone Encounter (Signed)
Last filled 09-18-18 #60 Last OV 10-15-18  Next OV 04-23-19 [facility] [address]

## 2018-11-15 ENCOUNTER — Other Ambulatory Visit: Payer: Self-pay | Admitting: Internal Medicine

## 2018-11-18 NOTE — Telephone Encounter (Signed)
Last filled 10-18-18 #60 Last OV 10-15-18 Next OV 04-23-19 [facility] [address]

## 2018-12-15 ENCOUNTER — Other Ambulatory Visit: Payer: Self-pay | Admitting: Internal Medicine

## 2018-12-19 ENCOUNTER — Other Ambulatory Visit: Payer: Self-pay | Admitting: Internal Medicine

## 2018-12-20 NOTE — Telephone Encounter (Signed)
Last filled 11-18-18 #60 Last OV 10-15-18 Next OV 04-23-19 [facility] [address]

## 2019-01-14 ENCOUNTER — Other Ambulatory Visit: Payer: Self-pay | Admitting: Internal Medicine

## 2019-01-15 NOTE — Telephone Encounter (Signed)
Electronic refill request Lorazepam Last office visit 10/15/18 Last refill 12/20/18 #60 Upcoming office visit 04/23/19 Dr. Silvio Pate is out of the office

## 2019-01-19 ENCOUNTER — Other Ambulatory Visit: Payer: Self-pay | Admitting: Internal Medicine

## 2019-01-20 NOTE — Telephone Encounter (Signed)
Last filled 12-20-18 #60 Last OV 10-15-18 Next OV 04-23-19 [facility] [address]

## 2019-02-09 ENCOUNTER — Other Ambulatory Visit: Payer: Self-pay | Admitting: Internal Medicine

## 2019-02-15 ENCOUNTER — Other Ambulatory Visit: Payer: Self-pay | Admitting: Internal Medicine

## 2019-02-17 NOTE — Telephone Encounter (Signed)
Last filled 12-20-18 #60 Last OV 10-15-18 Next OV 04-23-19 [facility] [address]

## 2019-03-24 ENCOUNTER — Other Ambulatory Visit: Payer: Self-pay | Admitting: Internal Medicine

## 2019-03-25 NOTE — Telephone Encounter (Signed)
Last filled 02-18-19 #60 Last OV 10-15-18 Next OV 04-23-19 [facility] [address]

## 2019-04-23 ENCOUNTER — Encounter: Payer: Medicare HMO | Admitting: Internal Medicine

## 2019-04-29 ENCOUNTER — Other Ambulatory Visit: Payer: Self-pay | Admitting: Internal Medicine

## 2019-06-09 ENCOUNTER — Other Ambulatory Visit: Payer: Self-pay | Admitting: Internal Medicine

## 2019-06-11 NOTE — Telephone Encounter (Signed)
Lorazepam 1mg  Last filled 04/30/2019 #60 Last OV 10-15-18 Next OV 13/16/2021 [facility] [address]

## 2019-06-17 ENCOUNTER — Other Ambulatory Visit: Payer: Self-pay

## 2019-06-17 ENCOUNTER — Encounter: Payer: Self-pay | Admitting: Internal Medicine

## 2019-06-17 ENCOUNTER — Ambulatory Visit (INDEPENDENT_AMBULATORY_CARE_PROVIDER_SITE_OTHER): Payer: Medicare HMO | Admitting: Internal Medicine

## 2019-06-17 VITALS — BP 140/88 | HR 73 | Temp 97.7°F | Ht 71.0 in | Wt 213.0 lb

## 2019-06-17 DIAGNOSIS — E1159 Type 2 diabetes mellitus with other circulatory complications: Secondary | ICD-10-CM

## 2019-06-17 DIAGNOSIS — G479 Sleep disorder, unspecified: Secondary | ICD-10-CM

## 2019-06-17 DIAGNOSIS — Z0000 Encounter for general adult medical examination without abnormal findings: Secondary | ICD-10-CM

## 2019-06-17 DIAGNOSIS — Z7189 Other specified counseling: Secondary | ICD-10-CM | POA: Diagnosis not present

## 2019-06-17 DIAGNOSIS — I10 Essential (primary) hypertension: Secondary | ICD-10-CM | POA: Diagnosis not present

## 2019-06-17 DIAGNOSIS — R413 Other amnesia: Secondary | ICD-10-CM | POA: Diagnosis not present

## 2019-06-17 DIAGNOSIS — L723 Sebaceous cyst: Secondary | ICD-10-CM

## 2019-06-17 LAB — CBC
HCT: 47 % (ref 39.0–52.0)
Hemoglobin: 16 g/dL (ref 13.0–17.0)
MCHC: 34 g/dL (ref 30.0–36.0)
MCV: 97.2 fl (ref 78.0–100.0)
Platelets: 175 10*3/uL (ref 150.0–400.0)
RBC: 4.83 Mil/uL (ref 4.22–5.81)
RDW: 14.3 % (ref 11.5–15.5)
WBC: 3.4 10*3/uL — ABNORMAL LOW (ref 4.0–10.5)

## 2019-06-17 LAB — LIPID PANEL
Cholesterol: 184 mg/dL (ref 0–200)
HDL: 50.2 mg/dL (ref >39.00)
LDL Cholesterol: 116 mg/dL — ABNORMAL HIGH (ref 0–99)
NonHDL: 133.99
Total CHOL/HDL Ratio: 4
Triglycerides: 89 mg/dL (ref 0.0–149.0)
VLDL: 17.8 mg/dL (ref 0.0–40.0)

## 2019-06-17 LAB — COMPREHENSIVE METABOLIC PANEL
ALT: 18 U/L (ref 0–53)
AST: 25 U/L (ref 0–37)
Albumin: 4.4 g/dL (ref 3.5–5.2)
Alkaline Phosphatase: 76 U/L (ref 39–117)
BUN: 17 mg/dL (ref 6–23)
CO2: 32 mEq/L (ref 19–32)
Calcium: 9.5 mg/dL (ref 8.4–10.5)
Chloride: 100 mEq/L (ref 96–112)
Creatinine, Ser: 1.12 mg/dL (ref 0.40–1.50)
GFR: 76.23 mL/min (ref >60.00)
Glucose, Bld: 123 mg/dL — ABNORMAL HIGH (ref 70–99)
Potassium: 3.9 mEq/L (ref 3.5–5.1)
Sodium: 137 mEq/L (ref 135–145)
Total Bilirubin: 1.6 mg/dL — ABNORMAL HIGH (ref 0.2–1.2)
Total Protein: 7.6 g/dL (ref 6.0–8.3)

## 2019-06-17 LAB — T4, FREE: Free T4: 0.97 ng/dL (ref 0.60–1.60)

## 2019-06-17 LAB — VITAMIN B12: Vitamin B-12: 270 pg/mL (ref 211–911)

## 2019-06-17 LAB — HM DIABETES FOOT EXAM

## 2019-06-17 LAB — HEMOGLOBIN A1C: Hgb A1c MFr Bld: 6.3 % (ref 4.6–6.5)

## 2019-06-17 NOTE — Progress Notes (Signed)
Subjective:    Patient ID: Rodney Suarez, male    DOB: Jan 21, 1941, 79 y.o.   MRN: [id number]  HPI Here for Medicare wellness visit and follow up or chronic health conditions This visit occurred during the SARS-CoV-2 public health emergency.  Safety protocols were in place, including screening questions prior to the visit, additional usage of staff PPE, and extensive cleaning of exam room while observing appropriate contact time as indicated for disinfecting solutions.   Reviewed form and advanced directives Reviewed other doctors No alcohol or tobacco Walks most days at the track Did have some shots in eye---has foreign body while mowing lawn Hearing is okay Had 1 fall--coming out of auto parts store---no sig injury Independent with instrumental ADLs---he does all the shopping, etc He still notes some memory issues---no decline and no functional decline  Has mass on upper right back Has been there a while Is a cyst---reassured  Checks sugars every few days Usually under 100----"unless I cheat" No foot numbness, pain or tingling  Wife checks his BP occasionally Runs fine No chest pain or SOB No palpitations No dizziness or syncope  Still uses lorazepam most nights for sleep No anxiety or depression No anhedonia  Current Outpatient Medications on File Prior to Visit  Medication Sig Dispense Refill  . amLODipine (NORVASC) 10 MG tablet TAKE 1 TABLET BY MOUTH EVERY DAY 90 tablet 3  . aspirin EC 81 MG tablet Take 81 mg by mouth daily.    Marland Kitchen KLOR-CON M20 20 MEQ tablet Take 1 tablet (20 mEq total) by mouth 2 (two) times daily. 60 tablet 11  . lisinopril-hydrochlorothiazide (ZESTORETIC) 20-12.5 MG tablet TAKE 2 TABLETS BY MOUTH EVERY DAY 180 tablet 3  . LORazepam (ATIVAN) 1 MG tablet TAKE 1 TO 2 TABLETS BY MOUTH AT BEDTIME AS NEEDED FOR SLEEP 60 tablet 0  . pravastatin (PRAVACHOL) 20 MG tablet TAKE 1 TABLET BY MOUTH EVERY DAY 90 tablet 1   No current facility-administered  medications on file prior to visit.    Allergies  Allergen Reactions  . Meloxicam Other (See Comments)    Cramps at night  . Metformin     REACTION: diarrhea    Past Medical History:  Diagnosis Date  . Allergy   . BPH (benign prostatic hypertrophy)   . Diabetes mellitus   . ED (erectile dysfunction)   . History of prostate cancer   . Hx of adenomatous colonic polyps   . Hyperlipidemia   . Hypertension   . Osteoarthritis     Past Surgical History:  Procedure Laterality Date  . APPENDECTOMY  1956  . FOOT FRACTURE SURGERY  1996  . KNEE ARTHROPLASTY Right 03/06/2016   Procedure: COMPUTER ASSISTED TOTAL KNEE ARTHROPLASTY;  Surgeon: Dereck Leep, MD;  Location: [facility];  Service: Orthopedics;  Laterality: Right;  . KNEE ARTHROSCOPY  11/2005   right knee  . PROSTATE SURGERY  01/2009   I-131 implants  . SINUS SURGERY WITH INSTATRAK  09/2007    History reviewed. No pertinent family history.  Social History   Socioeconomic History  . Marital status: Married    Spouse name: Not on file  . Number of children: 2  . Years of education: Not on file  . Highest education level: Not on file  Occupational History  . 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 Smoking status: Former Smoker    Quit date: 04/03/1982    Years since  quitting: 37.2  . Smokeless tobacco: Never Used  Substance and Sexual Activity  . Alcohol use: No  . Drug use: No  . Sexual activity: Not on file  Other Topics Concern  . Not on file  Social History Narrative   No living will   Wife and daughter should be health care POAs   Discussed DNR--he requests this (done 10/14/12)   No tube feedings if cognitively unaware   Social Determinants of Health   Financial Resource Strain:   . Difficulty of Paying Living Expenses:   Food Insecurity:   . Worried About Charity fundraiser in the Last Year:   . Arboriculturist in the Last Year:     Transportation Needs:   . Film/video editor (Medical):   Marland Kitchen Lack of Transportation (Non-Medical):   Physical Activity:   . Days of Exercise per Week:   . Minutes of Exercise per Session:   Stress:   . Feeling of Stress :   Social Connections:   . Frequency of Communication with Friends and Family:   . Frequency of Social Gatherings with Friends and Family:   . Attends Religious Services:   . Active Member of Clubs or Organizations:   . Attends Archivist Meetings:   Marland Kitchen Marital Status:   Intimate Partner Violence:   . Fear of Current or Ex-Partner:   . Emotionally Abused:   Marland Kitchen Physically Abused:   . Sexually Abused:    Review of Systems No headaches Appetite is fine Weight is stable Wears seat belt Teeth okay--not as regular with dentist No rash or suspicious skin lesions No heartburn or dysphagia Bowels are fine--no blood Urine stream is fine. Nocturia is 1-2 per night No sig back pain. Some left knee pain at times--will try OTC med    Objective:   Physical Exam  Constitutional: He is oriented to person, place, and time. He appears well-developed. No distress.  HENT:  Full upper plate No oral lesions  Neck: No thyromegaly present.  Cardiovascular: Normal rate, regular rhythm, normal heart sounds and intact distal pulses. Exam reveals no gallop.  No murmur heard. Respiratory: Effort normal and breath sounds normal. No respiratory distress. He has no wheezes. He has no rales.  GI: Soft. There is no abdominal tenderness.  Musculoskeletal:        General: No tenderness or edema.  Lymphadenopathy:    He has no cervical adenopathy.  Neurological: He is alert and oriented to person, place, and time.  "March 2001----no, 2021" President--- "Zoila Shutter, [place] [id number]? Recall 0/3  Normal sensation in feet  Skin:  No foot lesions  Psychiatric: He has a normal mood and affect. His behavior is normal.           Assessment & Plan:

## 2019-06-17 NOTE — Assessment & Plan Note (Signed)
Has DNR 

## 2019-06-17 NOTE — Assessment & Plan Note (Signed)
Seems to still have good control without meds Will check labs Has HTN as well

## 2019-06-17 NOTE — Progress Notes (Signed)
Hearing Screening   Method: Audiometry   125Hz  250Hz  500Hz  1000Hz  2000Hz  3000Hz  4000Hz  6000Hz  8000Hz   Right ear:   20 20 20  20     Left ear:   20 20 20  20     Vision Screening Comments: February 2021

## 2019-06-17 NOTE — Assessment & Plan Note (Signed)
Uses the lorazepam regularly

## 2019-06-17 NOTE — Assessment & Plan Note (Signed)
BP Readings from Last 3 Encounters:  06/17/19 140/88  10/15/18 140/86  04/16/18 134/88   Reasonable control Is on ACEI and other meds

## 2019-06-17 NOTE — Assessment & Plan Note (Signed)
Not infected Reassured  No action needed

## 2019-06-17 NOTE — Assessment & Plan Note (Signed)
I have personally reviewed the Medicare Annual Wellness questionnaire and have noted 1. The patient's medical and social history 2. Their use of alcohol, tobacco or illicit drugs 3. Their current medications and supplements 4. The patient's functional ability including ADL's, fall risks, home safety risks and hearing or visual             impairment. 5. Diet and physical activities 6. Evidence for depression or mood disorders  The patients weight, height, BMI and visual acuity have been recorded in the chart I have made referrals, counseling and provided education to the patient based review of the above and I have provided the pt with a written personalized care plan for preventive services.  I have provided you with a copy of your personalized plan for preventive services. Please take the time to review along with your updated medication list.  No cancer screening due to age Td if any injury Flu vaccine in the fall Exercising fairly regularly

## 2019-06-17 NOTE — Assessment & Plan Note (Signed)
Stopping statin didn't make any difference Probably [facility] Will just recheck labs

## 2019-07-18 ENCOUNTER — Other Ambulatory Visit: Payer: Self-pay | Admitting: Internal Medicine

## 2019-07-19 NOTE — Telephone Encounter (Signed)
Last filled 06-11-19 #60 Last OV 06-17-19 No Future OV [facility] [address]

## 2019-09-19 ENCOUNTER — Other Ambulatory Visit: Payer: Self-pay | Admitting: Internal Medicine

## 2019-09-19 NOTE — Telephone Encounter (Signed)
Last filled 07-21-19-21 #60 Last OV 06-17-19 No Future OV [facility] [address]

## 2019-09-26 ENCOUNTER — Encounter: Payer: Self-pay | Admitting: Internal Medicine

## 2019-09-26 ENCOUNTER — Other Ambulatory Visit: Payer: Self-pay

## 2019-09-26 ENCOUNTER — Ambulatory Visit (INDEPENDENT_AMBULATORY_CARE_PROVIDER_SITE_OTHER): Payer: Medicare HMO | Admitting: Internal Medicine

## 2019-09-26 DIAGNOSIS — I10 Essential (primary) hypertension: Secondary | ICD-10-CM | POA: Diagnosis not present

## 2019-09-26 DIAGNOSIS — L723 Sebaceous cyst: Secondary | ICD-10-CM

## 2019-09-26 DIAGNOSIS — M25562 Pain in left knee: Secondary | ICD-10-CM | POA: Insufficient documentation

## 2019-09-26 NOTE — Assessment & Plan Note (Signed)
BP Readings from Last 3 Encounters:  06/17/19 140/88  10/15/18 140/86  04/16/18 134/88   Was 180/120 here Not clear if this was related to the aleve last night---told him to only use rarely No change in amlodipine for now--he will check at pharmacy

## 2019-09-26 NOTE — Patient Instructions (Signed)
Please try acetaminophen--- 2 of the 325 or 500mg  before going out to do yard work. Also use over the counter voltaren gel (diclofenac) on it up to 3-4 times a day. Try to limit the aleve. Check your blood pressure and let me know if it is over 140/90 regularly.

## 2019-09-26 NOTE — Progress Notes (Signed)
Subjective:    Patient ID: Rodney Suarez, male    DOB: [birth year], 79 y.o.   MRN: [id number]  HPI Here due to worsening left knee pain This visit occurred during the SARS-CoV-2 public health emergency.  Safety protocols were in place, including screening questions prior to the visit, additional usage of staff PPE, and extensive cleaning of exam room while observing appropriate contact time as indicated for disinfecting solutions.   Having some bad pain in left knee at times Does yard work---no problems Other times it hurts a lot No pain at rest  Will take 2 aleve if it is bad---does help No sig swelling Doesn't give out on him  Current Outpatient Medications on File Prior to Visit  Medication Sig Dispense Refill  . amLODipine (NORVASC) 10 MG tablet TAKE 1 TABLET BY MOUTH EVERY DAY 90 tablet 3  . aspirin EC 81 MG tablet Take 81 mg by mouth daily.    Marland Kitchen KLOR-CON M20 20 MEQ tablet Take 1 tablet (20 mEq total) by mouth 2 (two) times daily. 60 tablet 11  . lisinopril-hydrochlorothiazide (ZESTORETIC) 20-12.5 MG tablet TAKE 2 TABLETS BY MOUTH EVERY DAY 180 tablet 3  . LORazepam (ATIVAN) 1 MG tablet TAKE 1 TO 2 TABLETS BY MOUTH AT BEDTIME AS NEEDED FOR SLEEP 60 tablet 0  . pravastatin (PRAVACHOL) 20 MG tablet TAKE 1 TABLET BY MOUTH EVERY DAY 90 tablet 3   No current facility-administered medications on file prior to visit.    Allergies  Allergen Reactions  . Meloxicam Other (See Comments)    Cramps at night  . Metformin     REACTION: diarrhea    Past Medical History:  Diagnosis Date  . Allergy   . BPH (benign prostatic hypertrophy)   . Diabetes mellitus   . ED (erectile dysfunction)   . History of prostate cancer   . Hx of adenomatous colonic polyps   . Hyperlipidemia   . Hypertension   . Osteoarthritis     Past Surgical History:  Procedure Laterality Date  . APPENDECTOMY  1956  . FOOT FRACTURE SURGERY  1996  . KNEE ARTHROPLASTY Right 03/06/2016   Procedure: COMPUTER  ASSISTED TOTAL KNEE ARTHROPLASTY;  Surgeon: Dereck Leep, MD;  Location: [facility];  Service: Orthopedics;  Laterality: Right;  . KNEE ARTHROSCOPY  11/2005   right knee  . PROSTATE SURGERY  01/2009   I-131 implants  . SINUS SURGERY WITH INSTATRAK  09/2007    History reviewed. No pertinent family history.  Social History   Socioeconomic History  . Marital status: Married    Spouse name: Not on file  . Number of children: 2  . Years of education: Not on file  . Highest education level: Not on file  Occupational History  . 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 Smoking status: Former Smoker    Quit date: 04/03/1982    Years since quitting: 37.5  . Smokeless tobacco: Never Used  Substance and Sexual Activity  . Alcohol use: No  . Drug use: No  . Sexual activity: Not on file  Other Topics Concern  . Not on file  Social History Narrative   No living will   Wife and daughter should be health care POAs   Discussed DNR--he requests this (done 10/14/12)   No tube feedings if cognitively unaware   Social Determinants of Health   Financial Resource Strain:   . Difficulty of Paying  Living Expenses:   Food Insecurity:   . Worried About Charity fundraiser in the Last Year:   . Arboriculturist in the Last Year:   Transportation Needs:   . Film/video editor (Medical):   Marland Kitchen Lack of Transportation (Non-Medical):   Physical Activity:   . Days of Exercise per Week:   . Minutes of Exercise per Session:   Stress:   . Feeling of Stress :   Social Connections:   . Frequency of Communication with Friends and Family:   . Frequency of Social Gatherings with Friends and Family:   . Attends Religious Services:   . Active Member of Clubs or Organizations:   . Attends Archivist Meetings:   Marland Kitchen Marital Status:   Intimate Partner Violence:   . Fear of Current or Ex-Partner:   . Emotionally Abused:   Marland Kitchen Physically Abused:    . Sexually Abused:    Review of Systems  Had right TKR in the past No other joint issues     Objective:   Physical Exam  Musculoskeletal:     Comments: No left knee swelling Mild crepitus Normal ROM No meniscus or ligament findings  Skin:  Apparent cysts ---right upper back and behind left ear           Assessment & Plan:

## 2019-09-26 NOTE — Assessment & Plan Note (Signed)
Has been more of a problem--mostly with activity Likely just OA---not at point of needing to consider TKR Try tylenol and voltaren gel before yard work Aleve rarely Back to Dr Marry Guan if worsens

## 2019-09-26 NOTE — Assessment & Plan Note (Signed)
No symptoms Reassured If they bother him, will refer to surgeon

## 2019-11-13 ENCOUNTER — Other Ambulatory Visit: Payer: Self-pay | Admitting: Internal Medicine

## 2019-11-14 NOTE — Telephone Encounter (Signed)
Last filled 09-19-19 #60 Last OV 06-17-19 No Future OV [facility] [address]

## 2019-12-18 ENCOUNTER — Other Ambulatory Visit: Payer: Self-pay

## 2019-12-18 ENCOUNTER — Emergency Department: Payer: Medicare HMO

## 2019-12-18 ENCOUNTER — Encounter: Payer: Self-pay | Admitting: Emergency Medicine

## 2019-12-18 ENCOUNTER — Emergency Department
Admission: EM | Admit: 2019-12-18 | Discharge: 2019-12-18 | Disposition: A | Discharge status: Home / Self Care | Payer: Medicare HMO | Attending: Emergency Medicine | Admitting: Emergency Medicine

## 2019-12-18 DIAGNOSIS — Z96651 Presence of right artificial knee joint: Secondary | ICD-10-CM | POA: Diagnosis not present

## 2019-12-18 DIAGNOSIS — E119 Type 2 diabetes mellitus without complications: Secondary | ICD-10-CM | POA: Diagnosis not present

## 2019-12-18 DIAGNOSIS — S29011A Strain of muscle and tendon of front wall of thorax, initial encounter: Secondary | ICD-10-CM | POA: Insufficient documentation

## 2019-12-18 DIAGNOSIS — W378XXA Explosion and rupture of other pressurized tire, pipe or hose, initial encounter: Secondary | ICD-10-CM | POA: Insufficient documentation

## 2019-12-18 DIAGNOSIS — Z7982 Long term (current) use of aspirin: Secondary | ICD-10-CM | POA: Insufficient documentation

## 2019-12-18 DIAGNOSIS — S20301A Unspecified superficial injuries of right front wall of thorax, initial encounter: Secondary | ICD-10-CM | POA: Diagnosis present

## 2019-12-18 DIAGNOSIS — Z20822 Contact with and (suspected) exposure to covid-19: Secondary | ICD-10-CM | POA: Diagnosis not present

## 2019-12-18 DIAGNOSIS — E876 Hypokalemia: Secondary | ICD-10-CM | POA: Diagnosis not present

## 2019-12-18 DIAGNOSIS — Z79899 Other long term (current) drug therapy: Secondary | ICD-10-CM | POA: Insufficient documentation

## 2019-12-18 DIAGNOSIS — Z87891 Personal history of nicotine dependence: Secondary | ICD-10-CM | POA: Insufficient documentation

## 2019-12-18 DIAGNOSIS — T148XXA Other injury of unspecified body region, initial encounter: Secondary | ICD-10-CM

## 2019-12-18 DIAGNOSIS — R0789 Other chest pain: Secondary | ICD-10-CM

## 2019-12-18 DIAGNOSIS — I10 Essential (primary) hypertension: Secondary | ICD-10-CM | POA: Diagnosis not present

## 2019-12-18 LAB — CBC
HCT: 38.3 % — ABNORMAL LOW (ref 39.0–52.0)
Hemoglobin: 13.4 g/dL (ref 13.0–17.0)
MCH: 33.4 pg (ref 26.0–34.0)
MCHC: 35 g/dL (ref 30.0–36.0)
MCV: 95.5 fL (ref 80.0–100.0)
Platelets: 161 10*3/uL (ref 150–400)
RBC: 4.01 MIL/uL — ABNORMAL LOW (ref 4.22–5.81)
RDW: 13.6 % (ref 11.5–15.5)
WBC: 5.6 10*3/uL (ref 4.0–10.5)
nRBC: 0 % (ref 0.0–0.2)

## 2019-12-18 LAB — TROPONIN I (HIGH SENSITIVITY)
Troponin I (High Sensitivity): 14 ng/L (ref <18)
Troponin I (High Sensitivity): 17 ng/L (ref <18)

## 2019-12-18 LAB — BASIC METABOLIC PANEL
Anion gap: 9 (ref 5–15)
BUN: 13 mg/dL (ref 8–23)
CO2: 29 mmol/L (ref 22–32)
Calcium: 9 mg/dL (ref 8.9–10.3)
Chloride: 99 mmol/L (ref 98–111)
Creatinine, Ser: 1.08 mg/dL (ref 0.61–1.24)
GFR calc Af Amer: >60 mL/min (ref >60)
GFR calc non Af Amer: >60 mL/min (ref >60)
Glucose, Bld: 138 mg/dL — ABNORMAL HIGH (ref 70–99)
Potassium: 3.1 mmol/L — ABNORMAL LOW (ref 3.5–5.1)
Sodium: 137 mmol/L (ref 135–145)

## 2019-12-18 LAB — SARS CORONAVIRUS 2 BY RT PCR (HOSPITAL ORDER, PERFORMED IN ~~LOC~~ HOSPITAL LAB): SARS Coronavirus 2: NEGATIVE

## 2019-12-18 MED ORDER — POTASSIUM CHLORIDE 20 MEQ PO PACK
40.0000 meq | PACK | Freq: Once | ORAL | Status: AC
  Administered 2019-12-18: 40 meq via ORAL
  Filled 2019-12-18: qty 2

## 2019-12-18 NOTE — Discharge Instructions (Addendum)
Use Tylenol for pain and fevers.  Up to 1000 mg per dose, up to 4 times per day.  Do not take more than 4000 mg of Tylenol/acetaminophen within 24 hours..  I suspect the pain in your chest was due to a muscular strain from you changing a tire yesterday.  There is no evidence of strain or damage to your heart.  If you develop any worsening chest pains despite the Tylenol, especially in combination with fevers, passing out or throwing up, please return to the ED.

## 2019-12-18 NOTE — ED Provider Notes (Signed)
[facility] Emergency Department Provider Note ____________________________________________   First MD Initiated Contact with Patient 12/18/19 1813     (approximate)  I have reviewed the triage vital signs and the nursing notes.  HISTORY  Chief Complaint Chest Pain   HPI Kaylob Wallen is a 79 y.o. malewho presents to the ED for evaluation of chest pain.  Chart review indicates HTN on multiple agents.  No history of CAD.  Patient reports changing the tire in his car yesterday in an asymptomatic manner.  He reports having difficulty getting some of the knots off the hubcap and having to strain with difficulty to effectively change his tire.  Patient denies any chest pain, syncope, headache or trauma during this event.  Patient reports waking up this morning at the typical time with right-sided chest and shoulder pain.  He reports the pain was aching, constant and nonradiating, up to a 7/10 severity.  He reports his wife "gave me some pain pill" and reports gradually improving symptoms throughout the afternoon.  When I see the patient, he reports feeling well and has no pain.  He has no complaints.  Patient denies any recurrence of the pain or any associated symptoms with the pain.  Denies any associated nausea, diaphoresis, vomiting, shortness of breath, palpitations.   Reports tolerating p.o. intake and toileting at his baseline.  Past Medical History:  Diagnosis Date   Allergy    BPH (benign prostatic hypertrophy)    Diabetes mellitus    ED (erectile dysfunction)    History of prostate cancer    Hx of adenomatous colonic polyps    Hyperlipidemia    Hypertension    Osteoarthritis     Patient Active Problem List   Diagnosis Date Noted   Left knee pain 09/26/2019   Sleep disturbance 06/17/2019   Memory loss 04/10/2018   Sebaceous cyst 02/20/2018   Rash 09/18/2016   S/P total knee arthroplasty 03/06/2016   Advance directive discussed  with patient 02/10/2016   Primary osteoarthritis of right knee 12/31/2015   Leg cramps 12/14/2014   Routine general medical examination at a health care facility 12/28/2010   OSTEOARTHRITIS 12/22/2009   PROSTATE CANCER, HX OF 12/22/2008   Hyperlipemia 10/08/2007   BENIGN PROSTATIC HYPERTROPHY 10/08/2007   ADENOMATOUS COLONIC POLYP 03/07/2007   Type 2 diabetes mellitus with other circulatory complications ([place]) 40/11/6759   Essential hypertension, benign 03/07/2007   ALLERGIC RHINITIS 03/07/2007    Past Surgical History:  Procedure Laterality Date   [place] ARTHROPLASTY Right 03/06/2016   Procedure: COMPUTER ASSISTED TOTAL KNEE ARTHROPLASTY;  Surgeon: Dereck Leep, MD;  Location: [facility] ORS;  Service: Orthopedics;  Laterality: Right;   KNEE ARTHROSCOPY  11/2005   right knee   PROSTATE SURGERY  01/2009   I-131 implants   SINUS SURGERY WITH INSTATRAK  09/2007    Prior to Admission medications   Medication Sig Start Date End Date Taking? Authorizing Provider  amLODipine (NORVASC) 10 MG tablet TAKE 1 TABLET BY MOUTH EVERY DAY 12/16/18   Viviana Simpler I, MD  aspirin EC 81 MG tablet Take 81 mg by mouth daily.    [provider]  KLOR-CON M20 20 MEQ tablet Take 1 tablet (20 mEq total) by mouth 2 (two) times daily. 04/16/18   Venia Carbon, MD  lisinopril-hydrochlorothiazide (ZESTORETIC) 20-12.5 MG tablet TAKE 2 TABLETS BY MOUTH EVERY DAY 02/10/19   Venia Carbon, MD  LORazepam (  ATIVAN) 1 MG tablet TAKE 1 TO 2 TABLETS BY MOUTH AT BEDTIME AS NEEDED FOR SLEEP 11/14/19   Viviana Simpler I, MD  pravastatin (PRAVACHOL) 20 MG tablet TAKE 1 TABLET BY MOUTH EVERY DAY 09/19/19   Venia Carbon, MD    Allergies Meloxicam and Metformin  No family history on file.  Social History Social History   Tobacco Use   Smoking status: Former Smoker    Quit date: 04/03/1982    Years since quitting: 37.7   Smokeless  tobacco: Never Used  Substance Use Topics   Alcohol use: No   Drug use: No    Review of Systems  Constitutional: No fever/chills Eyes: No visual changes. ENT: No sore throat. Cardiovascular: Positive for chest pain Respiratory: Denies shortness of breath. Gastrointestinal: No abdominal pain.  No nausea, no vomiting.  No diarrhea.  No constipation. Genitourinary: Negative for dysuria. Musculoskeletal: Negative for back pain. Skin: Negative for rash. Neurological: Negative for headaches, focal weakness or numbness.   ____________________________________________   PHYSICAL EXAM:  VITAL SIGNS: Vitals:   12/18/19 1548 12/18/19 1815  BP: (!) 146/101 (!) 184/96  Pulse: 76 62  Resp: 16 16  Temp: 100 F (37.8 C) 98.4 F (36.9 C)  SpO2: 97% 98%      Constitutional: Alert and oriented. Well appearing and in no acute distress. Eyes: Conjunctivae are normal. PERRL. EOMI. Head: Atraumatic. Nose: No congestion/rhinnorhea. Mouth/Throat: Mucous membranes are moist.  Oropharynx non-erythematous. Neck: No stridor. No cervical spine tenderness to palpation. Cardiovascular: Normal rate, regular rhythm. Grossly normal heart sounds.  Good peripheral circulation. Respiratory: Normal respiratory effort.  No retractions. Lungs CTAB. Gastrointestinal: Soft , nondistended, nontender to palpation. No abdominal bruits. No CVA tenderness. Musculoskeletal: No lower extremity tenderness nor edema.  No joint effusions. No signs of acute trauma. Right chest and shoulder, at the area of question, does not have any external signs of trauma and is nontender to palpation.  No induration or fluctuance.  Right arm is distally neurovascularly intact. Neurologic:  Normal speech and language. No gross focal neurologic deficits are appreciated. No gait instability noted. Skin:  Skin is warm, dry and intact. No rash noted. Psychiatric: Mood and affect are normal. Speech and behavior are  normal.  ____________________________________________   LABS (all labs ordered are listed, but only abnormal results are displayed)  Labs Reviewed  BASIC METABOLIC PANEL - Abnormal; Notable for the following components:      Result Value   Potassium 3.1 (*)    Glucose, Bld 138 (*)    All other components within normal limits  CBC - Abnormal; Notable for the following components:   RBC 4.01 (*)    HCT 38.3 (*)    All other components within normal limits  SARS CORONAVIRUS 2 BY RT PCR (HOSPITAL ORDER, [place] [facility])  TROPONIN I (HIGH SENSITIVITY)  TROPONIN I (HIGH SENSITIVITY)   ____________________________________________  12 Lead EKG  Sinus rhythm, rate of 76 bpm.  Normal axis and intervals.  No evidence of acute ischemia. ____________________________________________  RADIOLOGY  ED MD interpretation: 2 view CXR without evidence of acute cardiopulmonary pathology or PTX.  Official radiology report(s): DG Chest 2 View  Result Date: 12/18/2019 CLINICAL DATA:  Chest pain EXAM: CHEST - 2 VIEW COMPARISON:  None. FINDINGS: No consolidation, features of edema, pneumothorax, or effusion. Pulmonary vascularity is normally distributed. The aorta is calcified. The remaining cardiomediastinal contours are unremarkable. No acute osseous or soft tissue abnormality. Degenerative changes are present in  the imaged spine and right shoulder. IMPRESSION: No acute cardiopulmonary abnormality. Aortic Atherosclerosis (ICD10-I70.0). Electronically Signed   By: Lovena Le M.D.   On: 12/18/2019 15:58    ____________________________________________   PROCEDURES and INTERVENTIONS  Procedure(s) performed (including Critical Care):  Procedures  Medications  potassium chloride (KLOR-CON) packet 40 mEq (40 mEq Oral Given 12/18/19 1835)    ____________________________________________   MDM / ED COURSE  Rather healthy and functional 79 year old man presents with  right-sided chest pain in the setting of recently changing his tire, most consistent with muscular strain and without evidence of ACS, amenable to outpatient management.  Patient with mild and persistent hypertension, likely at his baseline, otherwise normal vital signs on room air.  Exam is reassuring without evidence of acute pathology.  He is well-appearing without distress has no signs of neurovascular compromise.  He is nontender to the area of question and has no signs of direct trauma.  This is a normal exam.  Blood work demonstrates mild hypokalemia, that was repleted orally.  Patient has resolution of pain prior to my evaluation and has no chest pain here in the ED.  EKG is nonischemic and troponin is negative.  CXR shows no acute pathology, including PTX, pulmonary contusion or infiltrate.  Patient's pain was most likely due to muscular strain and he has no evidence of ACS.  We discussed outpatient management and return precautions for the ED for further chest pains.  Patient medically stable for discharge home.  .    ____________________________________________   FINAL CLINICAL IMPRESSION(S) / ED DIAGNOSES  Final diagnoses:  Muscle strain  Other chest pain  Hypokalemia     ED Discharge Orders    None       Ancel Easler Wyman   Note:  This document was prepared using Dragon voice recognition software and may include unintentional dictation errors.   Vladimir Crofts, MD 12/18/19 [phone]

## 2019-12-18 NOTE — ED Triage Notes (Signed)
Pt arrives via ems from home. Ems reports CP starting around 2 hours ago around noon. No radiation, stays in center chest. 8/10 pain. 324 Asprin, 1 SL nitro spray.   20g left ac Hypertensive, didn't take meds today ems  bp 184/112.

## 2019-12-18 NOTE — ED Notes (Signed)
D/c inst to pt iv dc'ed.  Pt signed e signature.

## 2019-12-18 NOTE — ED Triage Notes (Signed)
Pt reports this am started with a dull pain to his right chest. Pt states the pain is always there and dull but will intermittently get real heavy and pressure like. Pt denies SOB, nausea or other sx's.

## 2019-12-24 ENCOUNTER — Other Ambulatory Visit: Payer: Self-pay | Admitting: Internal Medicine

## 2019-12-24 ENCOUNTER — Telehealth: Payer: Self-pay

## 2019-12-24 NOTE — Telephone Encounter (Signed)
Left message to see how pt is feeling after recent trip to ER for chest pain.

## 2019-12-25 NOTE — Telephone Encounter (Signed)
Last filled 11-14-19 #60 Last OV 09-26-19 Next OV 09-22-20 [facility] [address] [address]

## 2020-01-12 LAB — HM DIABETES EYE EXAM

## 2020-01-17 IMAGING — MR MR SHOULDER*R* W/O CM
5 series · 34 of 40 positions shown · non-contrast
Comparison: Report from radiographs dated 10/31/2017

CLINICAL DATA: Right shoulder pain

EXAM:
MRI OF THE RIGHT SHOULDER WITHOUT CONTRAST
TECHNIQUE: Multiplanar, multisequence MR imaging of the shoulder was performed.
No intravenous contrast was administered.

[Series 3: PD fat-sat · oblique · right · 4.0mm · 0.44mm/px · 8 of 26 slices shown (1 of 2)]
[im 1/26]
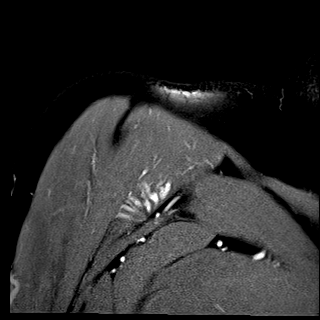
[im 4/26]
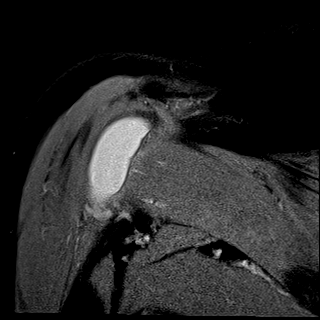
[im 8/26]
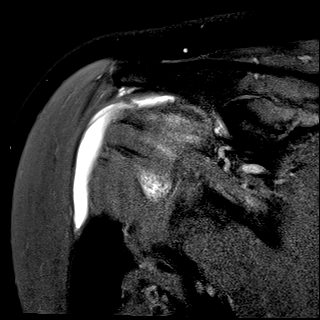
[im 11/26]
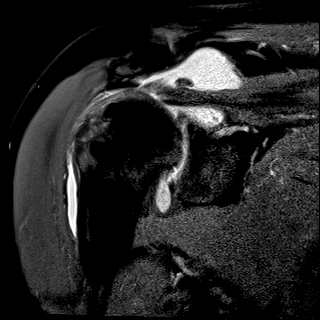
[im 15/26]
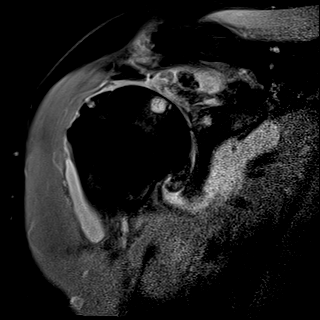
[im 18/26]
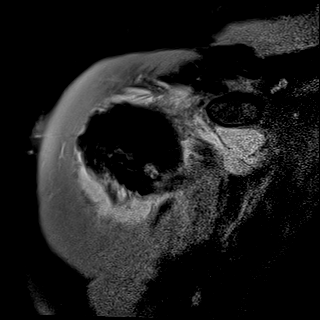
[im 22/26]
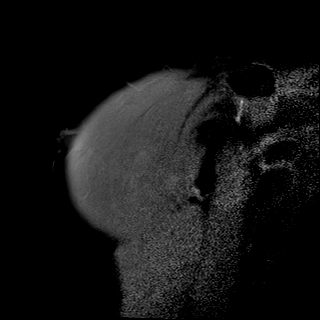
[im 26/26]
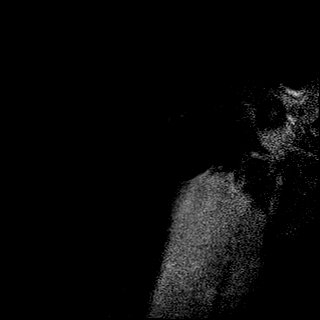

[Series 4: T2 fat-sat · oblique · right · 4.0mm · 0.44mm/px · 8 of 26 slices shown (1 of 2)]
[im 1/26]
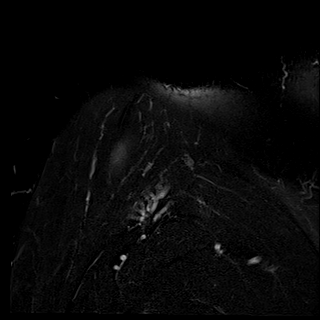
[im 4/26]
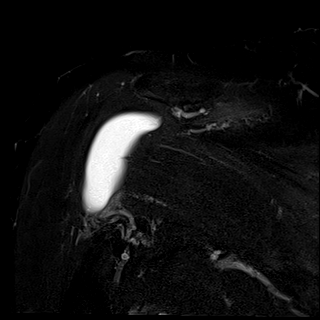
[im 8/26]
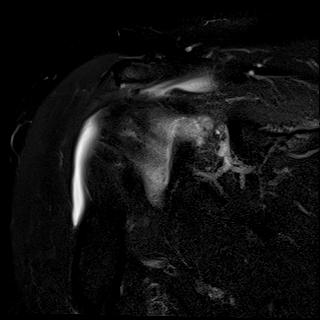
[im 11/26]
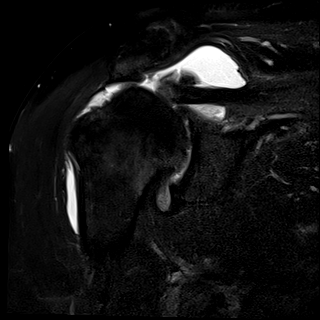
[im 15/26]
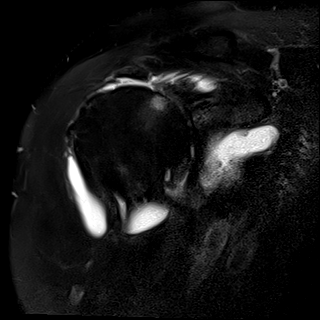
[im 18/26]
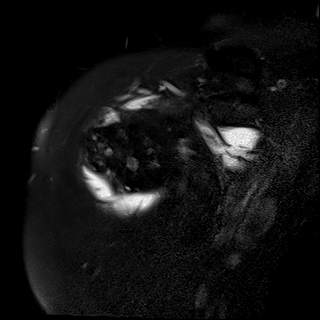
[im 22/26]
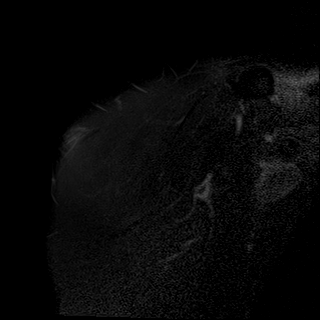
[im 26/26]
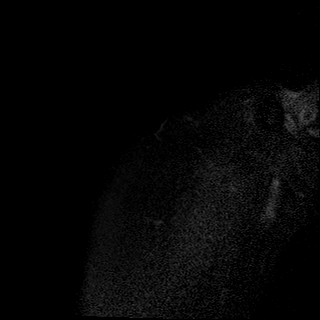

[Series 6: T1 · oblique · right · 4.0mm · 0.44mm/px · 2 of 29 slices shown]
[im 1/29]
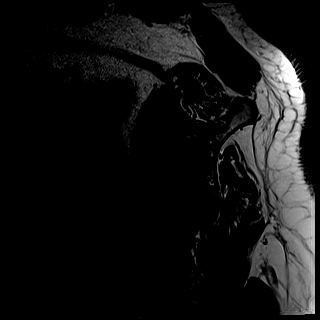
[im 5/29]
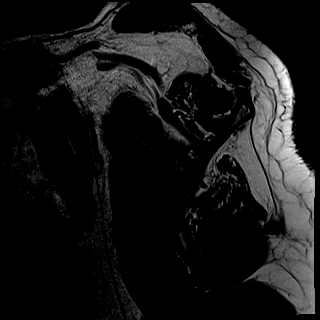

[Series 7: PD fat-sat · axial · right · 4.0mm · 0.55mm/px · z∈[-43,+87]mm · 8 of 28 slices shown (2 of 2)]
[im 1/28]
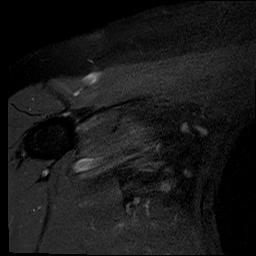
[im 4/28]
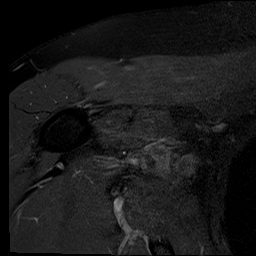
[im 8/28]
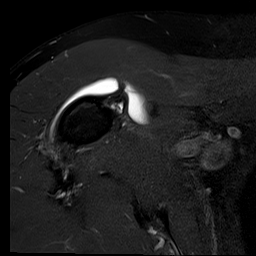
[im 12/28]
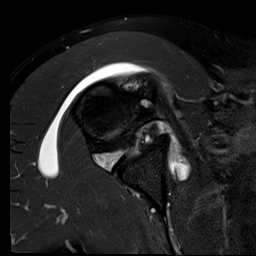
[im 16/28]
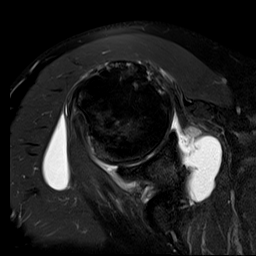
[im 20/28]
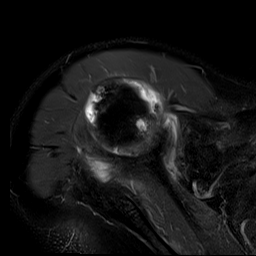
[im 24/28]
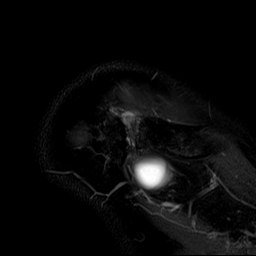
[im 28/28]
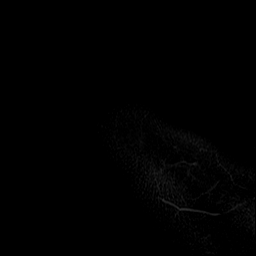

[Series 8: T2 fat-sat · oblique · right · 4.0mm · 0.27mm/px · 8 of 29 slices shown (2 of 2)]
[im 1/29]
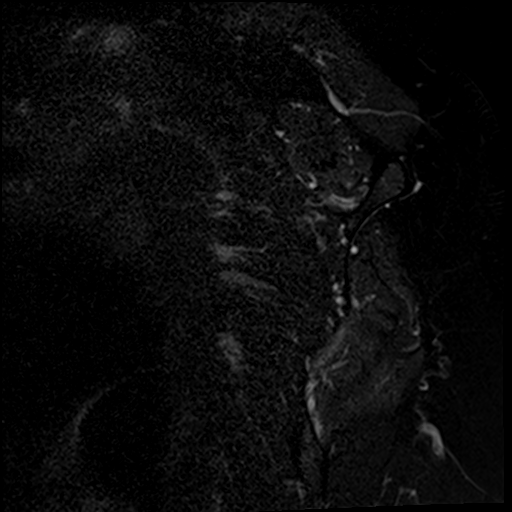
[im 5/29]
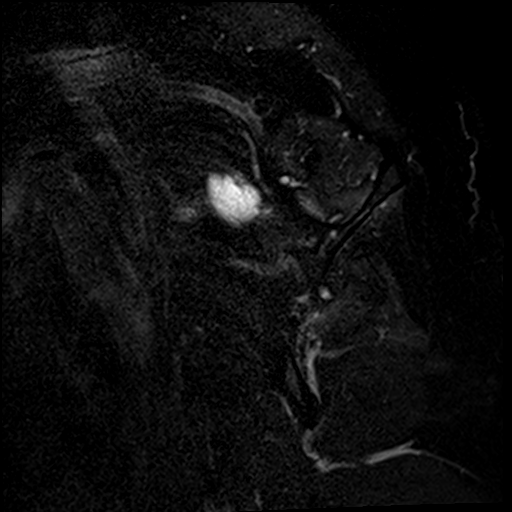
[im 9/29]
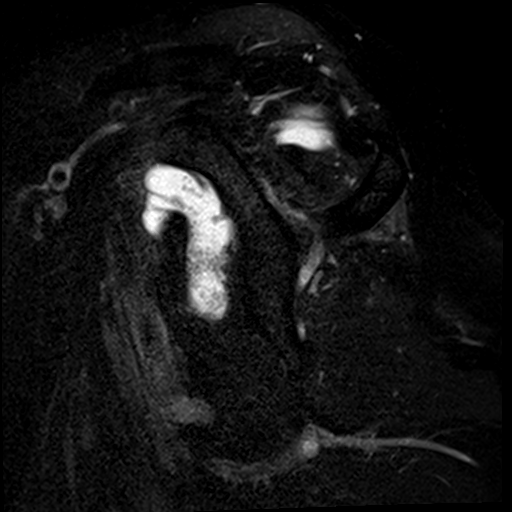
[im 13/29]
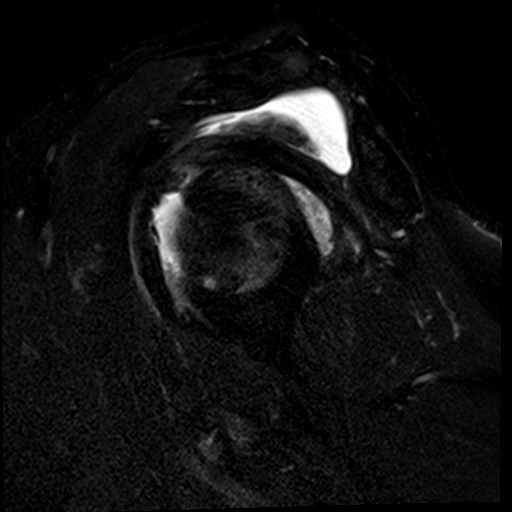
[im 17/29]
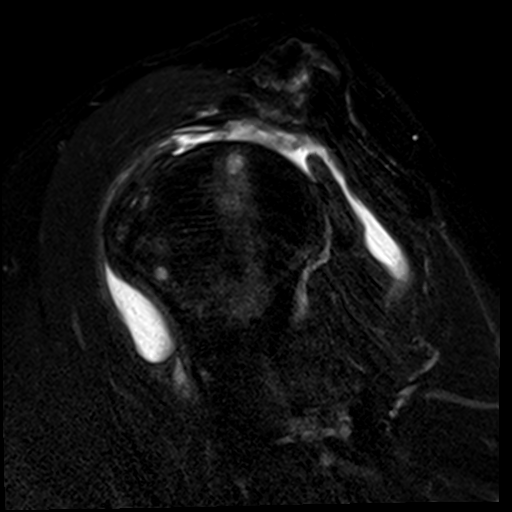
[im 21/29]
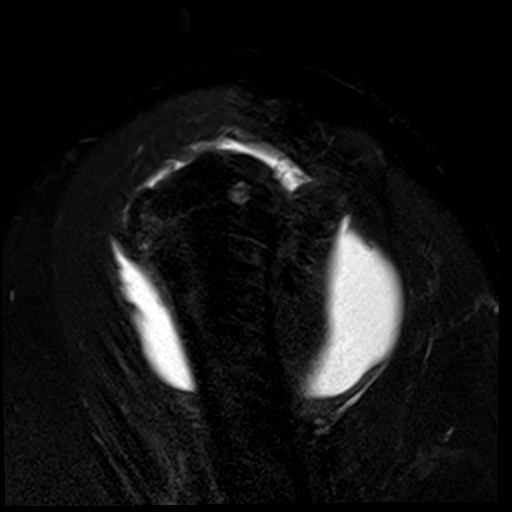
[im 25/29]
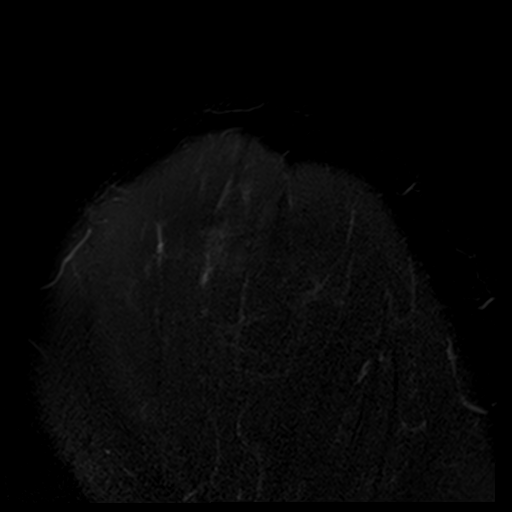
[im 29/29]
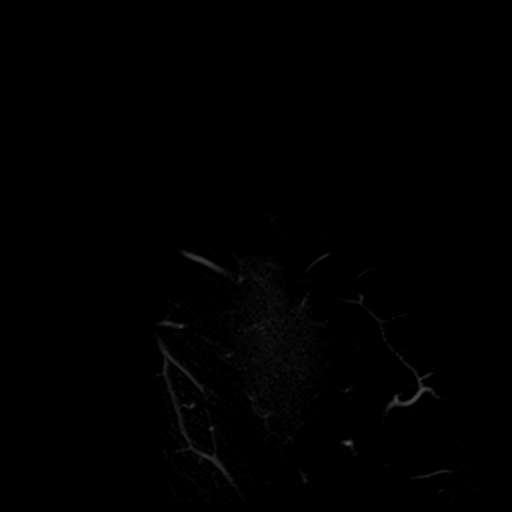

[34 of 40 positions shown; findings below may reference images not displayed]

FINDINGS: Rotator cuff: Full-thickness, full width rupture of the
supraspinatus tendon retracted 3.7 cm.

Full-thickness partial width tearing of the upper portion of the
infraspinatus tendon with a blunted distal infraspinatus with
equivocal attachment to the humeral head.

Partial thickness articular surface tearing of the subscapularis
tendon distally.

Muscles: Atrophic supraspinatus and infraspinatus muscles. Mild
fissuring along the teres minor myotendinous junction. Probable
lipoma in the distal teres minor muscle on image [DATE].

Biceps long head: Nonvisualization of the intra-articular segment
long-head of the biceps, likely ruptured.

Acromioclavicular Joint: Moderate spurring and mild subcortical
marrow edema along the AC joint. Type I acromion. Large amount of
fluid in the subacromial subdeltoid bursa with associated synovitis.
This freely communicates with the joint.

Glenohumeral Joint: Severe degenerative glenohumeral arthropathy
with prominent chondral thinning along the humeral head and upper
portion of the glenoid, prominent spurring, and extensive
subcortical cystic lesions and foci of subcortical marrow edema in
the humeral head. The humeral head partially pseudo articulates with
the undersurface of the acromion. Large glenohumeral joint effusion
with synovitis, communicating with the subacromial subdeltoid bursa.

Labrum: Irregular accentuated activity in the superior labrum
compatible with degenerative tearing.

Bones: No significant extra-articular osseous abnormalities
identified.

Other: No supplemental non-categorized findings.
IMPRESSION: 1. Full-thickness full width rupture of the supraspinatus tendon,
retracted 3.7 cm, with supraspinatus atrophy.
2. Full-thickness partial width tearing of the infraspinatus tendon.
A small portion of the infraspinatus tendon probably successfully
attaches to the humeral head although this is somewhat equivocal.
There is also diffuse infraspinatus muscular atrophy.
3. Partial thickness articular surface tearing of the subscapularis
tendon.
4. Nonvisualization of the long head of the biceps intra-articular
segment, favoring rupture.
5. Severe degenerative glenohumeral arthropathy with a large
glenohumeral joint effusion with synovitis freely communicating with
the prominent fluid in the subacromial subdeltoid bursa, also with
synovitis.
6. Moderate degenerative AC joint arthropathy.
7. Degenerative tearing of the superior labrum.
8. Mild fissuring along the teres minor myotendinous junction.

## 2020-01-21 ENCOUNTER — Ambulatory Visit (INDEPENDENT_AMBULATORY_CARE_PROVIDER_SITE_OTHER): Payer: Medicare HMO | Admitting: Internal Medicine

## 2020-01-21 ENCOUNTER — Encounter: Payer: Self-pay | Admitting: Internal Medicine

## 2020-01-21 ENCOUNTER — Other Ambulatory Visit: Payer: Self-pay

## 2020-01-21 VITALS — BP 130/86 | HR 94 | Temp 97.2°F | Ht 71.0 in | Wt 217.0 lb

## 2020-01-21 DIAGNOSIS — L723 Sebaceous cyst: Secondary | ICD-10-CM | POA: Diagnosis not present

## 2020-01-21 DIAGNOSIS — L089 Local infection of the skin and subcutaneous tissue, unspecified: Secondary | ICD-10-CM | POA: Diagnosis not present

## 2020-01-21 DIAGNOSIS — I10 Essential (primary) hypertension: Secondary | ICD-10-CM

## 2020-01-21 DIAGNOSIS — E1159 Type 2 diabetes mellitus with other circulatory complications: Secondary | ICD-10-CM

## 2020-01-21 DIAGNOSIS — Z23 Encounter for immunization: Secondary | ICD-10-CM | POA: Diagnosis not present

## 2020-01-21 LAB — POCT GLYCOSYLATED HEMOGLOBIN (HGB A1C): Hemoglobin A1C: 6.4 % — AB (ref 4.0–5.6)

## 2020-01-21 MED ORDER — CEPHALEXIN 500 MG PO CAPS: 500.0000 mg | ORAL_CAPSULE | Freq: Three times a day (TID) | ORAL | 0 refills | Status: DC

## 2020-01-21 MED ORDER — KLOR-CON M20 20 MEQ PO TBCR
20.0000 meq | EXTENDED_RELEASE_TABLET | Freq: Every day | ORAL | 3 refills | Status: DC
Start: 2020-01-21 — End: 2020-09-27

## 2020-01-21 NOTE — Patient Instructions (Signed)
Please try warm/hot compresses on the cyst to see if it will drain.

## 2020-01-21 NOTE — Progress Notes (Signed)
Subjective:    Patient ID: Rodney Suarez, male    DOB: 27-Jan-1941, 79 y.o.   MRN: [id number]  HPI Here due to pain from a knot on his back This visit occurred during the SARS-CoV-2 public health emergency.  Safety protocols were in place, including screening questions prior to the visit, additional usage of staff PPE, and extensive cleaning of exam room while observing appropriate contact time as indicated for disinfecting solutions.   Known cyst on his back seems to have gotten bigger Now painful as well Some tenderness---like lying on it in bed No drainage that he knows of (but may have seen some dried blood)  Not regularly checking sugars Last check about a month ago---that was fine No meds Tries to be careful with diet Weight is stable  Same BP meds No chest pain No SOB No sig edema  Current Outpatient Medications on File Prior to Visit  Medication Sig Dispense Refill  . amLODipine (NORVASC) 10 MG tablet TAKE 1 TABLET BY MOUTH EVERY DAY 90 tablet 3  . aspirin EC 81 MG tablet Take 81 mg by mouth daily.    Marland Kitchen KLOR-CON M20 20 MEQ tablet Take 1 tablet (20 mEq total) by mouth 2 (two) times daily. 60 tablet 11  . lisinopril-hydrochlorothiazide (ZESTORETIC) 20-12.5 MG tablet TAKE 2 TABLETS BY MOUTH EVERY DAY 180 tablet 3  . LORazepam (ATIVAN) 1 MG tablet TAKE 1 TO 2 TABLETS BY MOUTH AT BEDTIME AS NEEDED FOR SLEEP 60 tablet 0  . pravastatin (PRAVACHOL) 20 MG tablet TAKE 1 TABLET BY MOUTH EVERY DAY 90 tablet 3   No current facility-administered medications on file prior to visit.    Allergies  Allergen Reactions  . Meloxicam Other (See Comments)    Cramps at night  . Metformin     REACTION: diarrhea    Past Medical History:  Diagnosis Date  . Allergy   . BPH (benign prostatic hypertrophy)   . Diabetes mellitus   . ED (erectile dysfunction)   . History of prostate cancer   . Hx of adenomatous colonic polyps   . Hyperlipidemia   . Hypertension   . Osteoarthritis      Past Surgical History:  Procedure Laterality Date  . APPENDECTOMY  1956  . FOOT FRACTURE SURGERY  1996  . KNEE ARTHROPLASTY Right 03/06/2016   Procedure: COMPUTER ASSISTED TOTAL KNEE ARTHROPLASTY;  Surgeon: Dereck Leep, MD;  Location: [facility] ORS;  Service: Orthopedics;  Laterality: Right;  . KNEE ARTHROSCOPY  11/2005   right knee  . PROSTATE SURGERY  01/2009   I-131 implants  . SINUS SURGERY WITH INSTATRAK  09/2007    History reviewed. No pertinent family history.  Social History   Socioeconomic History  . Marital status: Married    Spouse name: Not on file  . Number of children: 2  . Years of education: Not on file  . Highest education level: Not on file  Occupational History  . 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 Smoking status: Former Smoker    Quit date: 04/03/1982    Years since quitting: 37.8  . Smokeless tobacco: Never Used  Substance and Sexual Activity  . Alcohol use: No  . Drug use: No  . Sexual activity: Not on file  Other Topics Concern  . Not on file  Social History Narrative   No living will   Wife and daughter should be health care POAs  Discussed DNR--he requests this (done 10/14/12)   No tube feedings if cognitively unaware   Social Determinants of Health   Financial Resource Strain:   . Difficulty of Paying Living Expenses: Not on file  Food Insecurity:   . Worried About Charity fundraiser in the Last Year: Not on file  . Ran Out of Food in the Last Year: Not on file  Transportation Needs:   . Lack of Transportation (Medical): Not on file  . Lack of Transportation (Non-Medical): Not on file  Physical Activity:   . Days of Exercise per Week: Not on file  . Minutes of Exercise per Session: Not on file  Stress:   . Feeling of Stress : Not on file  Social Connections:   . Frequency of Communication with Friends and Family: Not on file  . Frequency of Social Gatherings with Friends  and Family: Not on file  . Attends Religious Services: Not on file  . Active Member of Clubs or Organizations: Not on file  . Attends Archivist Meetings: Not on file  . Marital Status: Not on file  Intimate Partner Violence:   . Fear of Current or Ex-Partner: Not on file  . Emotionally Abused: Not on file  . Physically Abused: Not on file  . Sexually Abused: Not on file   Review of Systems  Sleeping okay No fever Stays busy with yard work, etc     Objective:   Physical Exam Constitutional:      Appearance: Normal appearance.  Cardiovascular:     Rate and Rhythm: Normal rate and regular rhythm.     Heart sounds: No murmur heard.  No gallop.   Pulmonary:     Effort: Pulmonary effort is normal.     Breath sounds: Normal breath sounds. No wheezing or rales.  Musculoskeletal:     Cervical back: Neck supple.     Comments: ~2cm diameter cyst with slight fluctuance/tenderness. Central crusted spot on upper portion of cyst  Lymphadenopathy:     Cervical: No cervical adenopathy.  Neurological:     Mental Status: He is alert.            Assessment & Plan:

## 2020-01-21 NOTE — Assessment & Plan Note (Signed)
Mild but worsening Discussed hot compresses Will give 5 days of cephalexin

## 2020-01-21 NOTE — Assessment & Plan Note (Addendum)
Seems to still have good control with just dietary measures Has HTN as well Will check A1c  Lab Results  Component Value Date   HGBA1C 6.4 (A) 01/21/2020   Still excellent control No medications needed

## 2020-01-21 NOTE — Assessment & Plan Note (Signed)
BP Readings from Last 3 Encounters:  01/21/20 130/86  12/18/19 (!) 184/96  06/17/19 140/88   Good control on lisinopril/HCTZ and amlodipine

## 2020-01-21 NOTE — Addendum Note (Signed)
Addended by: Pilar Grammes on: 01/21/2020 04:41 PM   Modules accepted: Orders

## 2020-01-27 ENCOUNTER — Other Ambulatory Visit: Payer: Self-pay | Admitting: Internal Medicine

## 2020-02-02 ENCOUNTER — Other Ambulatory Visit: Payer: Self-pay | Admitting: Internal Medicine

## 2020-02-02 NOTE — Telephone Encounter (Signed)
Last filled 12-25-19 #60 Last OV 01-21-20 Next OV [facility]-22-20 [facility] [address]

## 2020-03-08 ENCOUNTER — Other Ambulatory Visit: Payer: Self-pay

## 2020-03-08 ENCOUNTER — Ambulatory Visit (INDEPENDENT_AMBULATORY_CARE_PROVIDER_SITE_OTHER): Payer: Medicare HMO | Admitting: Family Medicine

## 2020-03-08 ENCOUNTER — Telehealth: Payer: Self-pay

## 2020-03-08 ENCOUNTER — Encounter: Payer: Self-pay | Admitting: Family Medicine

## 2020-03-08 VITALS — BP 140/90 | HR 88 | Temp 98.4°F | Ht 71.0 in | Wt 211.5 lb

## 2020-03-08 DIAGNOSIS — N3090 Cystitis, unspecified without hematuria: Secondary | ICD-10-CM

## 2020-03-08 DIAGNOSIS — R310 Gross hematuria: Secondary | ICD-10-CM | POA: Diagnosis not present

## 2020-03-08 LAB — POC URINALSYSI DIPSTICK (AUTOMATED)
Glucose, UA: POSITIVE — AB
Nitrite, UA: POSITIVE
Protein, UA: POSITIVE — AB
Spec Grav, UA: 1.015 (ref 1.010–1.025)
Urobilinogen, UA: 4 E.U./dL — AB
pH, UA: 6.5 (ref 5.0–8.0)

## 2020-03-08 MED ORDER — CEPHALEXIN 500 MG PO CAPS: 500.0000 mg | ORAL_CAPSULE | Freq: Three times a day (TID) | ORAL | 0 refills | Status: DC

## 2020-03-08 NOTE — Progress Notes (Signed)
Rodney Jakubowicz T. Francena Zender, MD, [place] [facility] [place], [address]  Phone: [phone]  FAX: [phone]  Rodney Suarez - 79 y.o. male  MRN [id number]  Date of Birth: [birth year]  Date: 03/08/2020  PCP: Venia Carbon, MD  Referral: Venia Carbon, MD  Chief Complaint  Patient presents with  . Hematuria  . Dysuria    This visit occurred during the SARS-CoV-2 public health emergency.  Safety protocols were in place, including screening questions prior to the visit, additional usage of staff PPE, and extensive cleaning of exam room while observing appropriate contact time as indicated for disinfecting solutions.   Subjective:   Rodney Suarez is a 79 y.o. very pleasant male patient with Body mass index is 29.5 kg/m. who presents with the following:  He is a pleasant 79 year old gentleman with some gross hematuria that started yesterday.  He does have a history of prostate cancer treated in 2010.  He is having some pain with urination.  He also has some urgency.  This is not really something that he typically feels at home he does not have a history of UTIs.  He is not sexually active.  Urinated and had some blood.   Blood every time with urinating.  Having some pain.   No falls or injuries.    Review of Systems is noted in the HPI, as appropriate  Objective:   BP 140/90   Pulse 88   Temp 98.4 F (36.9 C) (Temporal)   Ht 5\' 11"  (1.803 m)   Wt 211 lb 8 oz (95.9 kg)   SpO2 97%   BMI 29.50 kg/m   GEN: No acute distress; alert,appropriate. PULM: Breathing comfortably in no respiratory distress PSYCH: Normally interactive.  No hypogastric pain, and no CVA tenderness.  Laboratory and Imaging Data: Results for orders placed or performed in visit on 03/08/20  POCT Urinalysis Dipstick (Automated)  Result Value Ref Range   Color, UA Red    Clarity, UA Cloudy     Glucose, UA Positive (A) Negative   Bilirubin, UA 1+    Ketones, UA 1+    Spec Grav, UA 1.015 1.010 - 1.025   Blood, UA Large    pH, UA 6.5 5.0 - 8.0   Protein, UA Positive (A) Negative   Urobilinogen, UA 4.0 (A) 0.2 or 1.0 E.U./dL   Nitrite, UA Positive    Leukocytes, UA Large (3+) (A) Negative     Assessment and Plan:     ICD-10-CM   1. Cystitis  N30.90   2. Gross hematuria  R31.0 POCT Urinalysis Dipstick (Automated)    Urine Culture   This appears to be cystitis, treat as such.  Tailor antibiotics if needed based on culture results.  Meds ordered this encounter  Medications  . cephALEXin (KEFLEX) 500 MG capsule    Sig: Take 1 capsule (500 mg total) by mouth 3 (three) times daily for 7 days.    Dispense:  21 capsule    Refill:  0   Medications Discontinued During This Encounter  Medication Reason  . cephALEXin (KEFLEX) 500 MG capsule Completed Course   Orders Placed This Encounter  Procedures  . Urine Culture  . POCT Urinalysis Dipstick (Automated)    Follow-up: No follow-ups on file.  Signed,  Maud Deed. Brianca Fortenberry, MD   Outpatient Encounter Medications as of 03/08/2020  Medication Sig  . amLODipine (NORVASC) 10 MG tablet  TAKE 1 TABLET BY MOUTH EVERY DAY  . aspirin EC 81 MG tablet Take 81 mg by mouth daily.  Marland Kitchen KLOR-CON M20 20 MEQ tablet Take 1 tablet (20 mEq total) by mouth daily.  Marland Kitchen lisinopril-hydrochlorothiazide (ZESTORETIC) 20-12.5 MG tablet TAKE 2 TABLETS BY MOUTH EVERY DAY  . LORazepam (ATIVAN) 1 MG tablet TAKE 1 TO 2 TABLETS BY MOUTH AT BEDTIME AS NEEDED FOR SLEEP  . pravastatin (PRAVACHOL) 20 MG tablet TAKE 1 TABLET BY MOUTH EVERY DAY  . cephALEXin (KEFLEX) 500 MG capsule Take 1 capsule (500 mg total) by mouth 3 (three) times daily for 7 days.  . [DISCONTINUED] cephALEXin (KEFLEX) 500 MG capsule Take 1 capsule (500 mg total) by mouth 3 (three) times daily.   No facility-administered encounter medications on file as of 03/08/2020.

## 2020-03-08 NOTE — Telephone Encounter (Signed)
Per appt notes pt has appt wit Dr Lorelei Pont 03/08/20 at 3:40.

## 2020-03-08 NOTE — Telephone Encounter (Signed)
[facility] - Client TELEPHONE ADVICE RECORD [facility] Patient Name: Rodney Suarez Gender: Male DOB: 19-Jul-1940 Age: 79 Y 2 M 24 D Return Phone Number: [phone] (Primary), [phone] (Secondary) Address: City/State/Zip: [place] [address] Client [facility] - Client Client Site [facility] - Day Physician Viviana Simpler- MD Contact Type Call Who Is Calling Patient / Member / Family / Caregiver Call Type Triage / Clinical Relationship To Patient Self Return Phone Number [phone] (Secondary) Chief Complaint Urine, Blood In Reason for Call Symptomatic / Request for [place] has blood in urine Translation No Nurse Assessment Nurse: Wynetta Emery, RN, Baker Janus Date/Time Eilene Ghazi Time): 03/08/2020 9:49:54 AM Confirm and document reason for call. If symptomatic, describe symptoms. ---Rae Mar started yesterday with blood in urine. after finishing flow had a pain after urination. Does the patient have any new or worsening symptoms? ---Yes Will a triage be completed? ---Yes Related visit to physician within the last 2 weeks? ---No Does the PT have any chronic conditions? (i.e. diabetes, asthma, this includes High risk factors for pregnancy, etc.) ---Yes List chronic conditions. ---HTN Hyperlipidemia Is this a behavioral health or substance abuse call? ---No Guidelines Guideline Title Affirmed Question Affirmed Notes Nurse Date/Time Eilene Ghazi Time) Urine - Blood In Pain or burning with passing urine Wynetta Emery, RN, Baker Janus 03/08/2020 9:51:32 AM Disp. Time Eilene Ghazi Time) Disposition Final User 03/08/2020 9:34:15 AM Attempt made - line busy Wynetta Emery, RNBaker Janus 03/08/2020 9:52:30 AM See PCP within 24 Hours Yes Wynetta Emery, RN, Christin Bach Disagree/Comply Comply Caller Understands Yes PreDisposition Call Doctor PLEASE NOTE: All timestamps contained within this report are represented as  [place] Standard Time. CONFIDENTIALTY NOTICE: This fax transmission is intended only for the addressee. It contains information that is legally privileged, confidential or otherwise protected from use or disclosure. If you are not the intended recipient, you are strictly prohibited from reviewing, disclosing, copying using or disseminating any of this information or taking any action in reliance on or regarding this information. If you have received this fax in error, please notify us immediately by telephone so that we can arrange for its return to [place]. Phone: [phone], Toll-Free: [phone], Fax: [phone] Page: 2 of 2 Call Id: [id number] Care Advice Given Per Guideline SEE PCP WITHIN 24 HOURS: * Bring in a sample of the bloody urine. * Keep it in the refrigerator until you leave. * Fever occurs * You become worse CARE ADVICE given per Urine, Blood In (Adult) guideline. Comments User: Michele Rockers, RN Date/Time Eilene Ghazi Time): 03/08/2020 9:59:20 AM RN NOTE; Patient needs to be seen today your phone lines are not letting me connect to make appt can you call him back with time for appt. blood and pain after urination started yesterday Referrals REFERRED TO PCP OFFICE

## 2020-03-10 ENCOUNTER — Telehealth: Payer: Self-pay | Admitting: *Deleted

## 2020-03-10 LAB — URINE CULTURE
MICRO NUMBER:: 11280952
SPECIMEN QUALITY:: ADEQUATE

## 2020-03-10 MED ORDER — SULFAMETHOXAZOLE-TRIMETHOPRIM 800-160 MG PO TABS: 1.0000 | ORAL_TABLET | Freq: Two times a day (BID) | ORAL | 0 refills | Status: DC

## 2020-03-10 NOTE — Telephone Encounter (Signed)
-----   Message from Owens Loffler, MD sent at 03/10/2020  1:42 PM EST ----- We need to change his ABX  Stop the cephalexin  Start septra DS 1 tablet po bid for 3 days, #6

## 2020-03-10 NOTE — Telephone Encounter (Signed)
Mr. Mirabile notified as instructed by telephone.  Patient states understanding.  Rx for [facility] to [facility] [address] as instructed by Dr. Lorelei Pont.

## 2020-03-10 NOTE — Addendum Note (Signed)
Addended by: Carter Kitten on: 03/10/2020 04:05 PM   Modules accepted: Orders

## 2020-03-17 ENCOUNTER — Other Ambulatory Visit: Payer: Self-pay | Admitting: Internal Medicine

## 2020-03-18 NOTE — Telephone Encounter (Signed)
Last filled 02-03-20 #60 Last OV 03-08-20 Acute Next OV 09-22-20 [facility] [address]

## 2020-04-28 ENCOUNTER — Other Ambulatory Visit: Payer: Self-pay | Admitting: Internal Medicine

## 2020-04-28 NOTE — Telephone Encounter (Signed)
Last filled 03-18-20 #60 Last OV 01-21-20 Next OV 09-22-20 [facility] [address]

## 2020-04-30 ENCOUNTER — Encounter: Payer: Self-pay | Admitting: Internal Medicine

## 2020-04-30 ENCOUNTER — Ambulatory Visit (INDEPENDENT_AMBULATORY_CARE_PROVIDER_SITE_OTHER): Payer: Medicare HMO | Admitting: Internal Medicine

## 2020-04-30 ENCOUNTER — Other Ambulatory Visit: Payer: Self-pay

## 2020-04-30 VITALS — BP 126/72 | HR 92 | Temp 97.8°F | Ht 71.0 in | Wt 216.0 lb

## 2020-04-30 DIAGNOSIS — R3915 Urgency of urination: Secondary | ICD-10-CM

## 2020-04-30 DIAGNOSIS — N390 Urinary tract infection, site not specified: Secondary | ICD-10-CM | POA: Insufficient documentation

## 2020-04-30 DIAGNOSIS — R319 Hematuria, unspecified: Secondary | ICD-10-CM | POA: Diagnosis not present

## 2020-04-30 DIAGNOSIS — Z8546 Personal history of malignant neoplasm of prostate: Secondary | ICD-10-CM

## 2020-04-30 LAB — POC URINALSYSI DIPSTICK (AUTOMATED)
Bilirubin, UA: NEGATIVE
Glucose, UA: NEGATIVE
Ketones, UA: NEGATIVE
Nitrite, UA: NEGATIVE
Protein, UA: POSITIVE — AB
Spec Grav, UA: 1.02 (ref 1.010–1.025)
Urobilinogen, UA: 0.2 E.U./dL
pH, UA: 5.5 (ref 5.0–8.0)

## 2020-04-30 MED ORDER — SULFAMETHOXAZOLE-TRIMETHOPRIM 800-160 MG PO TABS: 1.0000 | ORAL_TABLET | Freq: Two times a day (BID) | ORAL | 1 refills | Status: DC

## 2020-04-30 NOTE — Assessment & Plan Note (Signed)
Recurrent symptoms Suggests anatomic problem---but had prostate seeds years ago Will treat again with sulfa Refer to urology for further evaluation

## 2020-04-30 NOTE — Progress Notes (Signed)
Subjective:    Patient ID: Rodney Suarez, male    DOB: [birth year], 80 y.o.   MRN: [id number]  HPI Here for recurrent urinary symptoms This visit occurred during the SARS-CoV-2 public health emergency.  Safety protocols were in place, including screening questions prior to the visit, additional usage of staff PPE, and extensive cleaning of exam room while observing appropriate contact time as indicated for disinfecting solutions.   Started with problems again about a week ago Gets urgency at night--pain with voiding (and not much) Does have daytime frequency---dysuria less obvious No blood in the urine this time  Current Outpatient Medications on File Prior to Visit  Medication Sig Dispense Refill  . amLODipine (NORVASC) 10 MG tablet TAKE 1 TABLET BY MOUTH EVERY DAY 90 tablet 3  . aspirin EC 81 MG tablet Take 81 mg by mouth daily.    Marland Kitchen KLOR-CON M20 20 MEQ tablet Take 1 tablet (20 mEq total) by mouth daily. 90 tablet 3  . lisinopril-hydrochlorothiazide (ZESTORETIC) 20-12.5 MG tablet TAKE 2 TABLETS BY MOUTH EVERY DAY 180 tablet 3  . LORazepam (ATIVAN) 1 MG tablet TAKE 1 TO 2 TABLETS BY MOUTH AT BEDTIME AS NEEDED FOR SLEEP 60 tablet 0  . pravastatin (PRAVACHOL) 20 MG tablet TAKE 1 TABLET BY MOUTH EVERY DAY 90 tablet 3   No current facility-administered medications on file prior to visit.    Allergies  Allergen Reactions  . Meloxicam Other (See Comments)    Cramps at night  . Metformin     REACTION: diarrhea    Past Medical History:  Diagnosis Date  . Allergy   . BPH (benign prostatic hypertrophy)   . Diabetes mellitus   . ED (erectile dysfunction)   . History of prostate cancer   . Hx of adenomatous colonic polyps   . Hyperlipidemia   . Hypertension   . Osteoarthritis     Past Surgical History:  Procedure Laterality Date  . APPENDECTOMY  1956  . FOOT FRACTURE SURGERY  1996  . KNEE ARTHROPLASTY Right 03/06/2016   Procedure: COMPUTER ASSISTED TOTAL KNEE ARTHROPLASTY;   Surgeon: Dereck Leep, MD;  Location: [facility];  Service: Orthopedics;  Laterality: Right;  . KNEE ARTHROSCOPY  11/2005   right knee  . PROSTATE SURGERY  01/2009   I-131 implants  . SINUS SURGERY WITH INSTATRAK  09/2007    History reviewed. No pertinent family history.  Social History   Socioeconomic History  . Marital status: Married    Spouse name: Not on file  . Number of children: 2  . Years of education: Not on file  . Highest education level: Not on file  Occupational History  . 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 Smoking status: Former Smoker    Quit date: 04/03/1982    Years since quitting: 38.1  . Smokeless tobacco: Never Used  Substance and Sexual Activity  . Alcohol use: No  . Drug use: No  . Sexual activity: Not on file  Other Topics Concern  . Not on file  Social History Narrative   No living will   Wife and daughter should be health care POAs   Discussed DNR--he requests this (done 10/14/12)   No tube feedings if cognitively unaware   Social Determinants of Health   Financial Resource Strain: Not on file  Food Insecurity: Not on file  Transportation Needs: Not on file  Physical Activity: Not on file  Stress: Not on file  Social Connections: Not on file  Intimate Partner Violence: Not on file    Review of Systems  No fever No N/V Eating fine No back pain     Objective:   Physical Exam Constitutional:      Appearance: Normal appearance.  Abdominal:     General: There is no distension.     Palpations: Abdomen is soft.     Tenderness: There is no abdominal tenderness. There is no right CVA tenderness or left CVA tenderness.  Neurological:     Mental Status: He is alert.            Assessment & Plan:

## 2020-04-30 NOTE — Assessment & Plan Note (Signed)
Long ago Will defer decision about checking PSA to urology

## 2020-05-12 ENCOUNTER — Encounter: Payer: Self-pay | Admitting: Urology

## 2020-05-12 ENCOUNTER — Other Ambulatory Visit: Payer: Self-pay

## 2020-05-12 ENCOUNTER — Ambulatory Visit (INDEPENDENT_AMBULATORY_CARE_PROVIDER_SITE_OTHER): Payer: Medicare HMO | Admitting: Urology

## 2020-05-12 VITALS — BP 139/82 | HR 72 | Ht 73.0 in | Wt 215.0 lb

## 2020-05-12 DIAGNOSIS — Z8546 Personal history of malignant neoplasm of prostate: Secondary | ICD-10-CM | POA: Diagnosis not present

## 2020-05-12 DIAGNOSIS — R310 Gross hematuria: Secondary | ICD-10-CM | POA: Diagnosis not present

## 2020-05-12 DIAGNOSIS — Z87440 Personal history of urinary (tract) infections: Secondary | ICD-10-CM

## 2020-05-12 LAB — MICROSCOPIC EXAMINATION: Bacteria, UA: NONE SEEN

## 2020-05-12 LAB — URINALYSIS, COMPLETE
Bilirubin, UA: NEGATIVE
Glucose, UA: NEGATIVE
Ketones, UA: NEGATIVE
Leukocytes,UA: NEGATIVE
Nitrite, UA: NEGATIVE
Protein,UA: NEGATIVE
Specific Gravity, UA: 1.025 (ref 1.005–1.030)
Urobilinogen, Ur: 1 mg/dL (ref 0.2–1.0)
pH, UA: 5 (ref 5.0–7.5)

## 2020-05-12 LAB — BLADDER SCAN AMB NON-IMAGING: Scan Result: 0

## 2020-05-12 NOTE — Progress Notes (Signed)
05/12/2020 12:47 PM   Rodney Suarez 18-Oct-1940 [id number]  Referring provider: Venia Carbon, MD [address] [place],  [place] [address]  Chief Complaint  Patient presents with  . Urinary Frequency    HPI: 80 year old male who presents today for further evaluation of gross hematuria/UTI.    He was seen and evaluated by his PCP on 03/08/2020 with urinary urgency and dysuria at the time.  He had several episodes of gross hematuria associated with this.  Urinalysis at the time was point-of-care but showed 3+ large leukocytes and large blood.  Urine culture ultimately grew E. coli resistant to ampicillin and only intermediately sensitive to Unasyn and Augmentin.  His antibiotics were adjusted accordingly.  She had recurrent symptoms and was seen again on 04/30/2020.  Urinalysis at that time was similar to the previous.  Unfortunately a culture was not sent.  He was started on Bactrim.  He has a personal history of prostate cancer status post brachytherapy in 2010.  He has not had a PSA checked in many years.  Last documented PSA in our system was 8 years ago, 0.6 in 2013.  Original pathology unknown unknown.  He reports today that his dysuria, gross hematuria have resolved.  He does have some mild frequency as well as nocturia x1-2 which he did previously have.  Of seems to be slowly improving.  He believes all of this was set off by a strenuous activity episode while he was trying to change a tire in December.  He developed chest pain after straining and was seen in the emergency room for this.  His hematuria and urinary symptoms started immediately around this time as well.  He is currently not in any BPH medications.  PVR is zero today.  UA is unremarkable, no evidence of persistent blood or ongoing infection.   PMH: Past Medical History:  Diagnosis Date  . Allergy   . BPH (benign prostatic hypertrophy)   . Diabetes mellitus   . ED (erectile dysfunction)   .  History of prostate cancer   . Hx of adenomatous colonic polyps   . Hyperlipidemia   . Hypertension   . Osteoarthritis     Surgical History: Past Surgical History:  Procedure Laterality Date  . APPENDECTOMY  1956  . FOOT FRACTURE SURGERY  1996  . KNEE ARTHROPLASTY Right 03/06/2016   Procedure: COMPUTER ASSISTED TOTAL KNEE ARTHROPLASTY;  Surgeon: Dereck Leep, MD;  Location: [facility] OR[facility];  Service: Orthopedics;  Laterality: Right;  . KNEE ARTHROSCOPY  11/2005   right knee  . PROSTATE SURGERY  01/2009   I-131 implants  . SINUS SURGERY WITH INSTATRAK  09/2007    Home Medications:  Allergies as of 05/12/2020      Reactions   Meloxicam Other (See Comments)   Cramps at night   Metformin    REACTION: diarrhea      Medication List       Accurate as of May 12, 2020 12:47 PM. If you have any questions, ask your nurse or doctor.        STOP taking these medications   sulfamethoxazole-trimethoprim 800-160 MG tablet Commonly known as: BACTRIM DS Stopped by: Hollice Espy, MD     TAKE these medications   amLODipine 10 MG tablet Commonly known as: NORVASC TAKE 1 TABLET BY MOUTH EVERY DAY   aspirin EC 81 MG tablet Take 81 mg by mouth daily.   Klor-Con M20 20 MEQ tablet Generic drug: potassium chloride SA Take 1  tablet (20 mEq total) by mouth daily.   lisinopril-hydrochlorothiazide 20-12.5 MG tablet Commonly known as: ZESTORETIC TAKE 2 TABLETS BY MOUTH EVERY DAY   LORazepam 1 MG tablet Commonly known as: ATIVAN TAKE 1 TO 2 TABLETS BY MOUTH AT BEDTIME AS NEEDED FOR SLEEP   pravastatin 20 MG tablet Commonly known as: PRAVACHOL TAKE 1 TABLET BY MOUTH EVERY DAY       Allergies:  Allergies  Allergen Reactions  . Meloxicam Other (See Comments)    Cramps at night  . Metformin     REACTION: diarrhea    Family History: History reviewed. No pertinent family history.  Social History:  reports that he quit smoking about 38 years ago. He has never used smokeless  tobacco. He reports that he does not drink alcohol and does not use drugs.   Physical Exam: BP 139/82   Pulse 72   Ht 6\' 1"  (1.854 m)   Wt 215 lb (97.5 kg)   BMI 28.37 kg/m   Constitutional:  Alert and oriented, No acute distress. HEENT: [place] AT, moist mucus membranes.  Trachea midline, no masses. Cardiovascular: No clubbing, cyanosis, or edema. Respiratory: Normal respiratory effort, no increased work of breathing. Skin: No rashes, bruises or suspicious lesions. Neurologic: Grossly intact, no focal deficits, moving all 4 extremities. Psychiatric: Normal mood and affect.  Laboratory Data: Lab Results  Component Value Date   WBC 5.6 12/18/2019   HGB 13.4 12/18/2019   HCT 38.3 (L) 12/18/2019   MCV 95.5 12/18/2019   PLT 161 12/18/2019    Lab Results  Component Value Date   CREATININE 1.08 12/18/2019    Lab Results  Component Value Date   PSA 0.67 04/01/2012     Lab Results  Component Value Date   HGBA1C 6.4 (A) 01/21/2020    Urinalysis UA reviewed today, see epic.  Negative.  Pertinent Imaging: Results for orders placed or performed in visit on 05/12/20  BLADDER SCAN AMB NON-IMAGING  Result Value Ref Range   Scan Result 0 ml     Assessment & Plan:    1. History of recurrent UTIs UTI x2 over the past few months, unclear whether this was an incompletely treated or true recurrent infection-antibiotic course may not been long enough if this was infectious prostatitis  Urinary symptoms are improving with some mild frequency which I suspect are irritative.  We will plan to reassess his urinary symptoms in a few months as at this time he is fairly minimal bother.  Urinalysis today is reassuring, no evidence of ongoing infection or microscopic blood.  Bladder emptying is excellent, PVR is minimal today.  No recent upper tract imaging for review.  - Urinalysis, Complete - BLADDER SCAN AMB NON-IMAGING - PSA; Future - PSA  2. History of prostate cancer We will  check a PSA today given that this has not been done in quite some time  We will have him follow-up again in 3 months for IPSS/PVR/UA to reassess.  If his irritative urinary symptoms have failed to improve or worsen, will consider further treatment of these.  For the time being supportive care.   Hollice Espy, MD  [facility] [address] [place], [place] [address] [phone]  I spent 45 total minutes on the day of the encounter including pre-visit review of the medical record, face-to-face time with the patient, and post visit ordering of labs/imaging/tests.  Review of records and labs along with face-to-face time with the primary time involved portions  of today's visit.

## 2020-05-13 ENCOUNTER — Telehealth: Payer: Self-pay | Admitting: *Deleted

## 2020-05-13 DIAGNOSIS — Z8546 Personal history of malignant neoplasm of prostate: Secondary | ICD-10-CM

## 2020-05-13 DIAGNOSIS — Z87440 Personal history of urinary (tract) infections: Secondary | ICD-10-CM

## 2020-05-13 LAB — PSA: Prostate Specific Ag, Serum: 12.7 ng/mL — ABNORMAL HIGH (ref 0.0–4.0)

## 2020-05-13 NOTE — Telephone Encounter (Addendum)
Patient informed, lab appointment scheduled.  ----- Message from Hollice Espy, MD sent at 05/13/2020  1:07 PM EST ----- Please let this patient know that his PSA is markedly elevated having had treatment for his prostate cancer.  May be related to his recent infections.  Lets recheck it in 6 weeks and if it still elevated, he would like to see him in clinic sooner to discuss options.  Hollice Espy, MD

## 2020-05-31 ENCOUNTER — Other Ambulatory Visit: Payer: Self-pay | Admitting: Internal Medicine

## 2020-06-29 ENCOUNTER — Other Ambulatory Visit: Payer: Medicare HMO

## 2020-06-29 ENCOUNTER — Other Ambulatory Visit: Payer: Self-pay

## 2020-06-29 ENCOUNTER — Other Ambulatory Visit: Payer: Self-pay | Admitting: Internal Medicine

## 2020-06-29 DIAGNOSIS — Z8546 Personal history of malignant neoplasm of prostate: Secondary | ICD-10-CM | POA: Diagnosis not present

## 2020-06-29 NOTE — Telephone Encounter (Signed)
Last filled 04-29-20 #60 Last OV Acute 04-30-20 Next OV [facility]-22[facility]20 [facility] [address] [address]

## 2020-06-30 LAB — PSA: Prostate Specific Ag, Serum: 11.3 ng/mL — ABNORMAL HIGH (ref 0.0–4.0)

## 2020-07-13 ENCOUNTER — Other Ambulatory Visit: Payer: Self-pay | Admitting: Ophthalmology

## 2020-07-13 DIAGNOSIS — H348312 Tributary (branch) retinal vein occlusion, right eye, stable: Secondary | ICD-10-CM | POA: Diagnosis not present

## 2020-07-13 DIAGNOSIS — G453 Amaurosis fugax: Secondary | ICD-10-CM | POA: Diagnosis not present

## 2020-07-14 ENCOUNTER — Other Ambulatory Visit: Payer: Self-pay

## 2020-07-14 ENCOUNTER — Other Ambulatory Visit
Admission: RE | Admit: 2020-07-14 | Discharge: 2020-07-14 | Disposition: A | Discharge status: Home / Self Care | Payer: Medicare HMO | Attending: Ophthalmology | Admitting: Ophthalmology

## 2020-07-14 DIAGNOSIS — G453 Amaurosis fugax: Secondary | ICD-10-CM | POA: Insufficient documentation

## 2020-07-14 LAB — CBC
HCT: 40.8 % (ref 39.0–52.0)
Hemoglobin: 14.1 g/dL (ref 13.0–17.0)
MCH: 33.6 pg (ref 26.0–34.0)
MCHC: 34.6 g/dL (ref 30.0–36.0)
MCV: 97.1 fL (ref 80.0–100.0)
Platelets: 184 10*3/uL (ref 150–400)
RBC: 4.2 MIL/uL — ABNORMAL LOW (ref 4.22–5.81)
RDW: 13.2 % (ref 11.5–15.5)
WBC: 4.8 10*3/uL (ref 4.0–10.5)
nRBC: 0 % (ref 0.0–0.2)

## 2020-07-14 LAB — SEDIMENTATION RATE: Sed Rate: 10 mm/hr (ref 0–20)

## 2020-07-14 LAB — C-REACTIVE PROTEIN: CRP: 0.6 mg/dL (ref <1.0)

## 2020-08-12 ENCOUNTER — Ambulatory Visit (INDEPENDENT_AMBULATORY_CARE_PROVIDER_SITE_OTHER): Payer: Medicare HMO | Admitting: Physician Assistant

## 2020-08-12 ENCOUNTER — Other Ambulatory Visit: Payer: Self-pay

## 2020-08-12 VITALS — BP 164/75 | HR 93 | Ht 73.0 in | Wt 219.0 lb

## 2020-08-12 DIAGNOSIS — Z8546 Personal history of malignant neoplasm of prostate: Secondary | ICD-10-CM | POA: Diagnosis not present

## 2020-08-12 DIAGNOSIS — Z87440 Personal history of urinary (tract) infections: Secondary | ICD-10-CM | POA: Diagnosis not present

## 2020-08-12 LAB — URINALYSIS, COMPLETE
Bilirubin, UA: NEGATIVE
Ketones, UA: NEGATIVE
Leukocytes,UA: NEGATIVE
Nitrite, UA: NEGATIVE
Protein,UA: NEGATIVE
RBC, UA: NEGATIVE
Specific Gravity, UA: 1.02 (ref 1.005–1.030)
Urobilinogen, Ur: 1 mg/dL (ref 0.2–1.0)
pH, UA: 5 (ref 5.0–7.5)

## 2020-08-12 LAB — MICROSCOPIC EXAMINATION
Bacteria, UA: NONE SEEN
RBC, Urine: NONE SEEN /hpf (ref 0–2)

## 2020-08-12 LAB — BLADDER SCAN AMB NON-IMAGING

## 2020-08-12 NOTE — Progress Notes (Signed)
08/12/2020 9:47 AM   Rodney Suarez Mar 20, 1941 [id number]  CC: Chief Complaint  Patient presents with  . Follow-up  . Recurrent UTI    HPI: Rodney Suarez is a 80 y.o. male with PMH prostate cancer s/p brachytherapy in 2010 and a recent history of recurrent versus persistent UTI who presents today for symptom recheck and PVR. He is accompanied today by his wife, who contributes to HPI.  Notably, patient was found to have an elevated PSA of 12.7 at his last clinic visit with Dr. Erlene Quan in February 2022. This was repeated 6 weeks later and was stably elevated at 11.3.  Today he reports feeling well.  He denies dysuria or gross hematuria.  He has occasional daytime frequency every 2 hours, which resolves on its own.  He reports nocturia x2. He reports 2 days of low back pain earlier this week that has spontaneously resolved.  IPSS 14/mixed as below.  PVR 0 mL.  In-office UA today positive for trace glucose; urine microscopy pan negative.   IPSS    Row Name 08/12/20 0900         International Prostate Symptom Score   How often have you had the sensation of not emptying your bladder? Less than 1 in 5     How often have you had to urinate less than every two hours? About half the time     How often have you found you stopped and started again several times when you urinated? About half the time     How often have you found it difficult to postpone urination? Less than half the time     How often have you had a weak urinary stream? Less than half the time     How often have you had to strain to start urination? Less than 1 in 5 times     How many times did you typically get up at night to urinate? 2 Times     Total IPSS Score 14           Quality of Life due to urinary symptoms   If you were to spend the rest of your life with your urinary condition just the way it is now how would you feel about that? Mixed            PMH: Past Medical History:  Diagnosis Date  . Allergy    . BPH (benign prostatic hypertrophy)   . Diabetes mellitus   . ED (erectile dysfunction)   . History of prostate cancer   . Hx of adenomatous colonic polyps   . Hyperlipidemia   . Hypertension   . Osteoarthritis     Surgical History: Past Surgical History:  Procedure Laterality Date  . APPENDECTOMY  1956  . FOOT FRACTURE SURGERY  1996  . KNEE ARTHROPLASTY Right 03/06/2016   Procedure: COMPUTER ASSISTED TOTAL KNEE ARTHROPLASTY;  Surgeon: Dereck Leep, MD;  Location: [facility] OR[facility];  Service: Orthopedics;  Laterality: Right;  . KNEE ARTHROSCOPY  11/2005   right knee  . PROSTATE SURGERY  01/2009   I-131 implants  . SINUS SURGERY WITH INSTATRAK  09/2007    Home Medications:  Allergies as of 08/12/2020      Reactions   Meloxicam Other (See Comments)   Cramps at night   Metformin    REACTION: diarrhea      Medication List       Accurate as of Aug 12, 2020  9:47 AM. If you have any questions,  ask your nurse or doctor.        amLODipine 10 MG tablet Commonly known as: NORVASC TAKE 1 TABLET BY MOUTH EVERY DAY   aspirin EC 81 MG tablet Take 81 mg by mouth daily.   Klor-Con M20 20 MEQ tablet Generic drug: potassium chloride SA Take 1 tablet (20 mEq total) by mouth daily.   lisinopril-hydrochlorothiazide 20-12.5 MG tablet Commonly known as: ZESTORETIC TAKE 2 TABLETS BY MOUTH EVERY DAY   LORazepam 1 MG tablet Commonly known as: ATIVAN TAKE 1 TO 2 TABLETS BY MOUTH AT BEDTIME AS NEEDED FOR SLEEP   pravastatin 20 MG tablet Commonly known as: PRAVACHOL TAKE 1 TABLET BY MOUTH EVERY DAY       Allergies:  Allergies  Allergen Reactions  . Meloxicam Other (See Comments)    Cramps at night  . Metformin     REACTION: diarrhea    Family History: No family history on file.  Social History:   reports that he quit smoking about 38 years ago. He has never used smokeless tobacco. He reports that he does not drink alcohol and does not use drugs.  Physical Exam: BP (!)  164/75   Pulse 93   Ht 6\' 1"  (1.854 m)   Wt 219 lb (99.3 kg)   BMI 28.89 kg/m   Constitutional:  Alert and oriented, no acute distress, nontoxic appearing HEENT: [place], AT Cardiovascular: No clubbing, cyanosis, or edema Respiratory: Normal respiratory effort, no increased work of breathing Skin: No rashes, bruises or suspicious lesions Neurologic: Grossly intact, no focal deficits, moving all 4 extremities Psychiatric: Normal mood and affect  Laboratory Data: Results for orders placed or performed in visit on 08/12/20  Microscopic Examination   Urine  Result Value Ref Range   WBC, UA 0-5 0 - 5 /hpf   RBC None seen 0 - 2 /hpf   Epithelial Cells (non renal) 0-10 0 - 10 /hpf   Casts Present (A) None seen /lpf   Cast Type Hyaline casts N/A   Bacteria, UA None seen None seen/Few  Urinalysis, Complete  Result Value Ref Range   Specific Gravity, UA 1.020 1.005 - 1.030   pH, UA 5.0 5.0 - 7.5   Color, UA Yellow Yellow   Appearance Ur Hazy (A) Clear   Leukocytes,UA Negative Negative   Protein,UA Negative Negative/Trace   Glucose, UA Trace (A) Negative   Ketones, UA Negative Negative   RBC, UA Negative Negative   Bilirubin, UA Negative Negative   Urobilinogen, Ur 1.0 0.2 - 1.0 mg/dL   Nitrite, UA Negative Negative   Microscopic Examination See below:   Bladder Scan (Post Void Residual) in office  Result Value Ref Range   Scan Result 22mL    Assessment & Plan:   1. History of recurrent UTIs UA benign today and patient continues to empty well. Mild, occasional nocturia and frequency more likely associated with underlying BPH versus prostate cancer as below. - Urinalysis, Complete - Bladder Scan (Post Void Residual) in office  2. History of prostate cancer PSA stably elevated s/p brachytherapy in 2010 consistent with recurrence. Patient to follow up with Dr. Erlene Quan next week to discuss further.   Return in about 1 week (around 08/19/2020) for Prostate cancer follow-up with Dr.  Erlene Quan.  Debroah Loop, PA-C  [facility] [address] [place], [place] [address] [phone]

## 2020-08-12 NOTE — Progress Notes (Deleted)
uin 

## 2020-08-18 ENCOUNTER — Other Ambulatory Visit: Payer: Self-pay

## 2020-08-18 ENCOUNTER — Encounter: Payer: Self-pay | Admitting: Urology

## 2020-08-18 ENCOUNTER — Ambulatory Visit: Payer: Medicare HMO | Admitting: Urology

## 2020-08-18 VITALS — BP 178/97 | HR 81 | Ht 73.0 in | Wt 219.0 lb

## 2020-08-18 DIAGNOSIS — Z8546 Personal history of malignant neoplasm of prostate: Secondary | ICD-10-CM

## 2020-08-18 DIAGNOSIS — R972 Elevated prostate specific antigen [PSA]: Secondary | ICD-10-CM

## 2020-08-19 NOTE — Progress Notes (Signed)
08/18/2020 12:07 PM   Rodney Suarez September 14, 1940 [id number]  Referring provider: Venia Carbon, MD [address] [place],  [place] [address]  Chief Complaint  Patient presents with  . Elevated PSA    HPI: 80 year old male who presents today accompanied by his wife to discuss elevated PSA.  He has a personal history of prostate cancer status post brachytherapy in the remote past, 2010.  His PSA has not been checked in numerous years, last 0.6 and 2013.    At the time of initial evaluation, he was having BPH symptoms as well as UTI.  At the time, his PSA was noted to be 12.7.  This was checked again on 06/29/2020 and remained markedly elevated to 11.3.  He has had several urinalyses since then which are unremarkable without signs of ongoing infection.  No issues voiding today.  No dysuria or gross hematuria.      PMH: Past Medical History:  Diagnosis Date  . Allergy   . BPH (benign prostatic hypertrophy)   . Diabetes mellitus   . ED (erectile dysfunction)   . History of prostate cancer   . Hx of adenomatous colonic polyps   . Hyperlipidemia   . Hypertension   . Osteoarthritis     Surgical History: Past Surgical History:  Procedure Laterality Date  . APPENDECTOMY  1956  . FOOT FRACTURE SURGERY  1996  . KNEE ARTHROPLASTY Right 03/06/2016   Procedure: COMPUTER ASSISTED TOTAL KNEE ARTHROPLASTY;  Surgeon: Dereck Leep, MD;  Location: [facility] ORS;  Service: Orthopedics;  Laterality: Right;  . KNEE ARTHROSCOPY  11/2005   right knee  . PROSTATE SURGERY  01/2009   I-131 implants  . SINUS SURGERY WITH INSTATRAK  09/2007    Home Medications:  Allergies as of 08/18/2020      Reactions   Meloxicam Other (See Comments)   Cramps at night   Metformin    REACTION: diarrhea      Medication List       Accurate as of Aug 18, 2020 11:59 PM. If you have any questions, ask your nurse or doctor.        amLODipine 10 MG tablet Commonly known as: NORVASC TAKE 1  TABLET BY MOUTH EVERY DAY   aspirin EC 81 MG tablet Take 81 mg by mouth daily.   Klor-Con M20 20 MEQ tablet Generic drug: potassium chloride SA Take 1 tablet (20 mEq total) by mouth daily.   lisinopril-hydrochlorothiazide 20-12.5 MG tablet Commonly known as: ZESTORETIC TAKE 2 TABLETS BY MOUTH EVERY DAY   LORazepam 1 MG tablet Commonly known as: ATIVAN TAKE 1 TO 2 TABLETS BY MOUTH AT BEDTIME AS NEEDED FOR SLEEP   pravastatin 20 MG tablet Commonly known as: PRAVACHOL TAKE 1 TABLET BY MOUTH EVERY DAY       Allergies:  Allergies  Allergen Reactions  . Meloxicam Other (See Comments)    Cramps at night  . Metformin     REACTION: diarrhea    Family History: No family history on file.  Social History:  reports that he quit smoking about 38 years ago. He has never used smokeless tobacco. He reports that he does not drink alcohol and does not use drugs.   Physical Exam: BP (!) 178/97   Pulse 81   Ht 6\' 1"  (1.854 m)   Wt 219 lb (99.3 kg)   BMI 28.89 kg/m   Constitutional:  Alert and oriented, No acute distress.  Wife present today.  HEENT: [place] AT,  moist mucus membranes.  Trachea midline, no masses. Cardiovascular: No clubbing, cyanosis, or edema. Respiratory: Normal respiratory effort, no increased work of breathing. Skin: No rashes, bruises or suspicious lesions. Neurologic: Grossly intact, no focal deficits, moving all 4 extremities. Psychiatric: Normal mood and affect.  Urinalysis Results for orders placed or performed in visit on 08/12/20  Microscopic Examination   Urine  Result Value Ref Range   WBC, UA 0-5 0 - 5 /hpf   RBC None seen 0 - 2 /hpf   Epithelial Cells (non renal) 0-10 0 - 10 /hpf   Casts Present (A) None seen /lpf   Cast Type Hyaline casts N/A   Bacteria, UA None seen None seen/Few  Urinalysis, Complete  Result Value Ref Range   Specific Gravity, UA 1.020 1.005 - 1.030   pH, UA 5.0 5.0 - 7.5   Color, UA Yellow Yellow   Appearance Ur Hazy (A)  Clear   Leukocytes,UA Negative Negative   Protein,UA Negative Negative/Trace   Glucose, UA Trace (A) Negative   Ketones, UA Negative Negative   RBC, UA Negative Negative   Bilirubin, UA Negative Negative   Urobilinogen, Ur 1.0 0.2 - 1.0 mg/dL   Nitrite, UA Negative Negative   Microscopic Examination See below:   Bladder Scan (Post Void Residual) in office  Result Value Ref Range   Scan Result 68mL     Assessment & Plan:    1. History of prostate cancer Personal history of prostate cancer with markedly elevated PSA concerning for possible prostate cancer recurrence  Initially, was concerned that this may be related to inflammation from infection but the infection has since cleared and his PSA still remains markedly elevated  We discussed options including continue to trend this especially given that it was stable x2 months versus going ahead and pursuing prostate cancer specific PET scan to help rule out local or metastatic disease.  After discussion today, he like to proceed with a PET scan.  Will plan for F 21 scan (PSMA) - NM PET (PSMA) SKULL TO MID THIGH; Future  F/u 1 month with PET scan  Hollice Espy, MD  [facility] [address] [place], [place] [address] [phone]

## 2020-08-25 ENCOUNTER — Other Ambulatory Visit: Payer: Self-pay

## 2020-08-25 ENCOUNTER — Ambulatory Visit
Admission: RE | Admit: 2020-08-25 | Discharge: 2020-08-25 | Disposition: A | Discharge status: Home / Self Care | Payer: Medicare HMO | Source: Ambulatory Visit | Attending: Ophthalmology | Admitting: Ophthalmology

## 2020-08-25 DIAGNOSIS — G453 Amaurosis fugax: Secondary | ICD-10-CM | POA: Diagnosis not present

## 2020-08-25 DIAGNOSIS — Z8673 Personal history of transient ischemic attack (TIA), and cerebral infarction without residual deficits: Secondary | ICD-10-CM | POA: Diagnosis not present

## 2020-08-25 DIAGNOSIS — I6523 Occlusion and stenosis of bilateral carotid arteries: Secondary | ICD-10-CM | POA: Diagnosis not present

## 2020-09-08 ENCOUNTER — Other Ambulatory Visit: Payer: Self-pay

## 2020-09-08 ENCOUNTER — Ambulatory Visit
Admission: RE | Admit: 2020-09-08 | Discharge: 2020-09-08 | Disposition: A | Discharge status: Home / Self Care | Payer: Medicare HMO | Source: Ambulatory Visit | Attending: Urology | Admitting: Urology

## 2020-09-08 DIAGNOSIS — Z8546 Personal history of malignant neoplasm of prostate: Secondary | ICD-10-CM | POA: Insufficient documentation

## 2020-09-08 DIAGNOSIS — Z125 Encounter for screening for malignant neoplasm of prostate: Secondary | ICD-10-CM | POA: Insufficient documentation

## 2020-09-08 DIAGNOSIS — I700 Atherosclerosis of aorta: Secondary | ICD-10-CM | POA: Diagnosis not present

## 2020-09-08 DIAGNOSIS — C61 Malignant neoplasm of prostate: Secondary | ICD-10-CM | POA: Diagnosis not present

## 2020-09-08 MED ORDER — PIFLIFOLASTAT F 18 (PYLARIFY) INJECTION
9.0000 | Freq: Once | INTRAVENOUS | Status: AC
  Administered 2020-09-08: 9.4 via INTRAVENOUS

## 2020-09-16 ENCOUNTER — Other Ambulatory Visit: Payer: Self-pay | Admitting: Internal Medicine

## 2020-09-22 ENCOUNTER — Encounter: Payer: Medicare HMO | Admitting: Internal Medicine

## 2020-09-26 ENCOUNTER — Other Ambulatory Visit: Payer: Self-pay | Admitting: Internal Medicine

## 2020-09-27 NOTE — Telephone Encounter (Signed)
Last filled 06-29-20 #60 Last OV 04-30-20 Next OV 12-02-20 [facility] [address]

## 2020-10-05 ENCOUNTER — Ambulatory Visit (INDEPENDENT_AMBULATORY_CARE_PROVIDER_SITE_OTHER): Payer: Medicare HMO | Admitting: Urology

## 2020-10-05 ENCOUNTER — Other Ambulatory Visit: Payer: Self-pay

## 2020-10-05 VITALS — BP 155/83 | HR 73 | Ht 73.0 in | Wt 218.0 lb

## 2020-10-05 DIAGNOSIS — R972 Elevated prostate specific antigen [PSA]: Secondary | ICD-10-CM

## 2020-10-05 DIAGNOSIS — Z8546 Personal history of malignant neoplasm of prostate: Secondary | ICD-10-CM | POA: Diagnosis not present

## 2020-10-05 NOTE — Progress Notes (Signed)
10/05/2020 4:17 PM   [name] 02-12-1941 [id number]  Referring provider: Venia Carbon, MD [place],  [place] [address]  Chief Complaint  Patient presents with   Prostate Cancer    HPI: 80 year old male who returns today with follow-up PSMA PET scan for prostate cancer evaluation.  He has a personal history of prostate cancer status post brachytherapy in the remote past, 2010.  His PSA has not been checked in numerous years, last 0.6 and 2013.     At the time of initial evaluation, he was having BPH symptoms as well as UTI.  At the time, his PSA was noted to be 12.7.  This was checked again on 06/29/2020 and remained markedly elevated to 11.3.  In the interim, he is undergone PSMA PET scan on 09/08/2020.  This shows concern for a focal local recurrence within the prostate at the right apex.  There is no associated pelvic or metastatic disease.  There is an incidental left 4 mm intramuscular node of unclear significance.  Baseline urinary symptoms.  PMH: Past Medical History:  Diagnosis Date   Allergy    BPH (benign prostatic hypertrophy)    Diabetes mellitus    ED (erectile dysfunction)    History of prostate cancer    Hx of adenomatous colonic polyps    Hyperlipidemia    Hypertension    Osteoarthritis     Surgical History: Past Surgical History:  Procedure Laterality Date   APPENDECTOMY  1956   FOOT FRACTURE SURGERY  1996   KNEE ARTHROPLASTY Right 03/06/2016   Procedure: COMPUTER ASSISTED TOTAL KNEE ARTHROPLASTY;  Surgeon: Dereck Leep, MD;  Location: [facility] ORS;  Service: Orthopedics;  Laterality: Right;   KNEE ARTHROSCOPY  11/2005   right knee   PROSTATE SURGERY  01/2009   I-131 implants   SINUS SURGERY WITH INSTATRAK  09/2007    Home Medications:  Allergies as of 10/05/2020       Reactions   Meloxicam Other (See Comments)   Cramps at night   Metformin    REACTION: diarrhea        Medication List        Accurate as of  October 05, 2020  4:17 PM. If you have any questions, ask your nurse or doctor.          amLODipine 10 MG tablet Commonly known as: NORVASC TAKE 1 TABLET BY MOUTH EVERY DAY   aspirin EC 81 MG tablet Take 81 mg by mouth daily.   Klor-Con M20 20 MEQ tablet Generic drug: potassium chloride SA TAKE 1 TABLET BY MOUTH EVERY DAY   lisinopril-hydrochlorothiazide 20-12.5 MG tablet Commonly known as: ZESTORETIC TAKE 2 TABLETS BY MOUTH EVERY DAY   LORazepam 1 MG tablet Commonly known as: ATIVAN TAKE 1 TO 2 TABLETS BY MOUTH AT BEDTIME AS NEEDED FOR SLEEP   pravastatin 20 MG tablet Commonly known as: PRAVACHOL TAKE 1 TABLET BY MOUTH EVERY DAY        Allergies:  Allergies  Allergen Reactions   Meloxicam Other (See Comments)    Cramps at night   Metformin     REACTION: diarrhea    Family History: No family history on file.  Social History:  reports that he quit smoking about 38 years ago. He has never used smokeless tobacco. He reports that he does not drink alcohol and does not use drugs.   Physical Exam: BP (!) 155/83   Pulse 73   Ht 6\' 1"  (1.854  m)   Wt 218 lb (98.9 kg)   BMI 28.76 kg/m   Constitutional:  Alert and oriented, No acute distress. HEENT: [place] AT, moist mucus membranes.  Trachea midline, no masses. Cardiovascular: No clubbing, cyanosis, or edema. Respiratory: Normal respiratory effort, no increased work of breathing. Skin: No rashes, bruises or suspicious lesions. Neurologic: Grossly intact, no focal deficits, moving all 4 extremities. Psychiatric: Normal mood and affect.  Pertinent Imaging: IMPRESSION: 1. Concern for local prostate cancer recurrence within the RIGHT gland apex. 2. Intense activity associated with small nodule/node in the intermuscular fat anterior to the LEFT operator space. Unusual location for a nodal metastasis or metastasis otherwise; however, the intense activity would favor unusual site of prostate carcinoma metastasis. 3. No  evidence of metastatic disease outside the pelvis. No visceral metastasis or skeletal metastasis     Electronically Signed   By: Suzy Bouchard M.D.   On: 09/09/2020 14:43  PET scan images were personally reviewed today.  Agree with radiologic interpretation.  Assessment & Plan:    1. History of prostate cancer Personal history of prostate cancer now with rising PSA  Prostate specific PET scan is concerning for focal recurrence within the prostate as well as a possible oligometastatic nodule in the obturator space which is a very atypical location for oligometastatic disease  Given the complexity of this case as well as unusual possible mets, I counseled the patient that he like to present his case at tumor board and will get back with him with the follow-up recommendations when I can further discuss this with radiology, medical oncology as well as radiation oncology.  He is agreeable this plan.   - PSA; Future - PSA  2. Elevated PSA As above   Will call after tumor board with recommendations  Hollice Espy, MD  [facility] [address], [place], [place] [address] [phone]

## 2020-10-06 ENCOUNTER — Telehealth: Payer: Self-pay | Admitting: *Deleted

## 2020-10-06 LAB — PSA: Prostate Specific Ag, Serum: 15 ng/mL — ABNORMAL HIGH (ref 0.0–4.0)

## 2020-10-06 NOTE — Telephone Encounter (Addendum)
Left patient a VM with details   ----- Message from Hollice Espy, MD sent at 10/06/2020  1:11 PM EDT ----- PSA continues to trend upwards, now at 15.  We will get back with you late next week after tumor board discussion.  Hollice Espy, MD

## 2020-10-15 ENCOUNTER — Telehealth: Payer: Self-pay | Admitting: Urology

## 2020-10-15 DIAGNOSIS — Z8546 Personal history of malignant neoplasm of prostate: Secondary | ICD-10-CM

## 2020-10-15 DIAGNOSIS — R972 Elevated prostate specific antigen [PSA]: Secondary | ICD-10-CM

## 2020-10-15 NOTE — Telephone Encounter (Signed)
Case was presented yesterday at tumor board.  Radiologist highly suspect oligometastatic disease at this obturator focus as well as local recurrence within the prostate itself.  Dr. Baruch Gouty with unavailable during tumor board yesterday, I will reach out to him to discuss whether or not the patient would be a candidate for whole pelvic radiation following brachy seed implantation.  Recommendation for pelvic MRI was made to help further characterize this afternoon lesion.   Addendum 11/02/20: I was able to further discuss the treatment options with Dr. Baruch Gouty.  He does think that he has some options for further radiation to the pelvis.  We will have him seen in consultation and await further recommendations.  MRI ordered, radiation oncology referral placed.  If for what ever reason, he is not able to salvage with radiation, will likely discuss ADT further.  On follow-up with me in about 6 months or earlier if radiation is not possible.  The patient is agreeable with this plan.  Hollice Espy, MD

## 2020-10-22 DIAGNOSIS — H348312 Tributary (branch) retinal vein occlusion, right eye, stable: Secondary | ICD-10-CM | POA: Diagnosis not present

## 2020-10-22 DIAGNOSIS — H2513 Age-related nuclear cataract, bilateral: Secondary | ICD-10-CM | POA: Diagnosis not present

## 2020-11-13 ENCOUNTER — Other Ambulatory Visit: Payer: Self-pay

## 2020-11-13 ENCOUNTER — Ambulatory Visit
Admission: RE | Admit: 2020-11-13 | Discharge: 2020-11-13 | Disposition: A | Discharge status: Home / Self Care | Payer: Medicare HMO | Source: Ambulatory Visit | Attending: Urology | Admitting: Urology

## 2020-11-13 ENCOUNTER — Other Ambulatory Visit: Payer: Self-pay | Admitting: Internal Medicine

## 2020-11-13 DIAGNOSIS — R972 Elevated prostate specific antigen [PSA]: Secondary | ICD-10-CM | POA: Diagnosis not present

## 2020-11-13 DIAGNOSIS — Z8546 Personal history of malignant neoplasm of prostate: Secondary | ICD-10-CM | POA: Insufficient documentation

## 2020-11-13 DIAGNOSIS — M47816 Spondylosis without myelopathy or radiculopathy, lumbar region: Secondary | ICD-10-CM | POA: Diagnosis not present

## 2020-11-13 DIAGNOSIS — R188 Other ascites: Secondary | ICD-10-CM | POA: Diagnosis not present

## 2020-11-13 DIAGNOSIS — R590 Localized enlarged lymph nodes: Secondary | ICD-10-CM | POA: Diagnosis not present

## 2020-11-13 MED ORDER — GADOBUTROL 1 MMOL/ML IV SOLN
9.0000 mL | Freq: Once | INTRAVENOUS | Status: AC | PRN
  Administered 2020-11-13: 10 mL via INTRAVENOUS

## 2020-11-15 NOTE — Telephone Encounter (Signed)
Last filled 09-27-20 #30 Last OV 04-30-20 [facility]20 [facility] [address]

## 2020-11-17 ENCOUNTER — Telehealth: Payer: Self-pay

## 2020-11-17 NOTE — Telephone Encounter (Signed)
-----   Message from Hollice Espy, MD sent at 11/16/2020  1:08 PM EDT ----- Please let this patient know that his MRI looks very similar to his PET scan.  The small area in his pelvis does appear to probably be a metastatic lymph node.    Hollice Espy, MD

## 2020-11-18 ENCOUNTER — Telehealth: Payer: Self-pay

## 2020-11-18 NOTE — Telephone Encounter (Signed)
Pt aware and verbalized understanding.  

## 2020-11-18 NOTE — Telephone Encounter (Signed)
-----   Message from Hollice Espy, MD sent at 11/16/2020  1:08 PM EDT ----- Please let this patient know that his MRI looks very similar to his PET scan.  The small area in his pelvis does appear to probably be a metastatic lymph node.    Hollice Espy, MD

## 2020-11-19 ENCOUNTER — Other Ambulatory Visit: Payer: Self-pay | Admitting: Internal Medicine

## 2020-11-22 ENCOUNTER — Telehealth: Payer: Self-pay

## 2020-11-22 ENCOUNTER — Ambulatory Visit
Admission: RE | Admit: 2020-11-22 | Discharge: 2020-11-22 | Disposition: A | Discharge status: Home / Self Care | Payer: Medicare HMO | Source: Ambulatory Visit | Attending: Radiation Oncology | Admitting: Radiation Oncology

## 2020-11-22 ENCOUNTER — Encounter: Payer: Self-pay | Admitting: Radiation Oncology

## 2020-11-22 VITALS — BP 154/76 | HR 67 | Temp 97.6°F | Wt 215.3 lb

## 2020-11-22 DIAGNOSIS — E119 Type 2 diabetes mellitus without complications: Secondary | ICD-10-CM | POA: Diagnosis not present

## 2020-11-22 DIAGNOSIS — E785 Hyperlipidemia, unspecified: Secondary | ICD-10-CM | POA: Insufficient documentation

## 2020-11-22 DIAGNOSIS — Z7982 Long term (current) use of aspirin: Secondary | ICD-10-CM | POA: Diagnosis not present

## 2020-11-22 DIAGNOSIS — I10 Essential (primary) hypertension: Secondary | ICD-10-CM | POA: Diagnosis not present

## 2020-11-22 DIAGNOSIS — Z923 Personal history of irradiation: Secondary | ICD-10-CM | POA: Insufficient documentation

## 2020-11-22 DIAGNOSIS — Z79899 Other long term (current) drug therapy: Secondary | ICD-10-CM | POA: Insufficient documentation

## 2020-11-22 DIAGNOSIS — C61 Malignant neoplasm of prostate: Secondary | ICD-10-CM | POA: Insufficient documentation

## 2020-11-22 DIAGNOSIS — N400 Enlarged prostate without lower urinary tract symptoms: Secondary | ICD-10-CM | POA: Diagnosis not present

## 2020-11-22 DIAGNOSIS — Z87891 Personal history of nicotine dependence: Secondary | ICD-10-CM | POA: Insufficient documentation

## 2020-11-22 DIAGNOSIS — Z86010 Personal history of colonic polyps: Secondary | ICD-10-CM | POA: Insufficient documentation

## 2020-11-22 DIAGNOSIS — Z8546 Personal history of malignant neoplasm of prostate: Secondary | ICD-10-CM

## 2020-11-22 DIAGNOSIS — M1990 Unspecified osteoarthritis, unspecified site: Secondary | ICD-10-CM | POA: Diagnosis not present

## 2020-11-22 NOTE — Telephone Encounter (Signed)
Prior auth form faxed

## 2020-11-22 NOTE — Progress Notes (Signed)
NEW PATIENT EVALUATION  Name: Rodney Suarez  MRN: [id number]  Date:   11/22/2020     DOB: [birth year]   This 80 y.o. male patient presents to the clinic for initial evaluation of biochemical relapse of prostate cancer in patient now out 12 years having received I-125 interstitial implant.  With PET scan showing hypermetabolic tip in the apex of the prostate gland as well as left obturator nodal region with no evidence of metastatic disease outside the pelvis.  REFERRING PHYSICIAN: Venia Carbon, MD  CHIEF COMPLAINT: No chief complaint on file.   DIAGNOSIS: The encounter diagnosis was PROSTATE CANCER, HX OF.   PREVIOUS INVESTIGATIONS:  PET/CT scan reviewed MRI also reviewed Pathology reviewed Clinical notes reviewed  HPI: Patient is a 80 year old male status post I-125 interstitial implant 12 years prior by Dr. Eliberto Ivory in [place].  Not sure of the pathology at that time or surgical pathology findings although we are trying to retrieve those records.  He is been having a rising PSA most recently 12.7.  He underwent a PSMA PET scan on September 08, 2020 showing right apical hypermetabolic to be consistent with recurrence.  There was no evidence of pelvic or metastatic disease there was an incidental left 4 mm intramuscular node of unclear significance.  MRI of the pelvis showed 8.4 cm short axis left obturator no corresponding to the area of focal hypermetabolic cavity along the left obturator for fossa concerning for nodal involvement.  Patient is clinically doing well specifically denies any increased lower urinary tract symptoms no significant nocturia urgency or frequency bowel function is normal.  He is seen today for consideration of salvage treatment.  PLANNED TREATMENT REGIMEN: Salvage radiation therapy  PAST MEDICAL HISTORY:  has a past medical history of Allergy, BPH (benign prostatic hypertrophy), Diabetes mellitus, ED (erectile dysfunction), History of prostate cancer, adenomatous  colonic polyps, Hyperlipidemia, Hypertension, and Osteoarthritis.    PAST SURGICAL HISTORY:  Past Surgical History:  Procedure Laterality Date   APPENDECTOMY  1956   FOOT FRACTURE SURGERY  1996   KNEE ARTHROPLASTY Right 03/06/2016   Procedure: COMPUTER ASSISTED TOTAL KNEE ARTHROPLASTY;  Surgeon: Dereck Leep, MD;  Location: [facility] ORS;  Service: Orthopedics;  Laterality: Right;   KNEE ARTHROSCOPY  11/2005   right knee   PROSTATE SURGERY  01/2009   I-131 implants   SINUS SURGERY WITH INSTATRAK  09/2007    FAMILY HISTORY: family history is not on file.  SOCIAL HISTORY:  reports that he quit smoking about 38 years ago. His smoking use included cigarettes. He has never used smokeless tobacco. He reports that he does not drink alcohol and does not use drugs.  ALLERGIES: Meloxicam and Metformin  MEDICATIONS:  Current Outpatient Medications  Medication Sig Dispense Refill   amLODipine (NORVASC) 10 MG tablet TAKE 1 TABLET BY MOUTH EVERY DAY 90 tablet 0   aspirin EC 81 MG tablet Take 81 mg by mouth daily.     KLOR-CON M20 20 MEQ tablet TAKE 1 TABLET BY MOUTH EVERY DAY 90 tablet 3   lisinopril-hydrochlorothiazide (ZESTORETIC) 20-12.5 MG tablet TAKE 2 TABLETS BY MOUTH EVERY DAY 180 tablet 0   LORazepam (ATIVAN) 1 MG tablet TAKE 1 TO 2 TABLETS BY MOUTH AT BEDTIME AS NEEDED FOR SLEEP 60 tablet 0   pravastatin (PRAVACHOL) 20 MG tablet TAKE 1 TABLET BY MOUTH EVERY DAY 90 tablet 3   No current facility-administered medications for this encounter.    ECOG PERFORMANCE STATUS:  0 - Asymptomatic  REVIEW OF SYSTEMS:  Patient denies any weight loss, fatigue, weakness, fever, chills or night sweats. Patient denies any loss of vision, blurred vision. Patient denies any ringing  of the ears or hearing loss. No irregular heartbeat. Patient denies heart murmur or history of fainting. Patient denies any chest pain or pain radiating to her upper extremities. Patient denies any shortness of breath, difficulty  breathing at night, cough or hemoptysis. Patient denies any swelling in the lower legs. Patient denies any nausea vomiting, vomiting of blood, or coffee ground material in the vomitus. Patient denies any stomach pain. Patient states has had normal bowel movements no significant constipation or diarrhea. Patient denies any dysuria, hematuria or significant nocturia. Patient denies any problems walking, swelling in the joints or loss of balance. Patient denies any skin changes, loss of hair or loss of weight. Patient denies any excessive worrying or anxiety or significant depression. Patient denies any problems with insomnia. Patient denies excessive thirst, polyuria, polydipsia. Patient denies any swollen glands, patient denies easy bruising or easy bleeding. Patient denies any recent infections, allergies or URI. Patient "s visual fields have not changed significantly in recent time.   PHYSICAL EXAM: BP (!) 154/76   Pulse 67   Temp 97.6 F (36.4 C) (Tympanic)   Wt 215 lb 4.8 oz (97.7 kg)   BMI 28.41 kg/m  Well-developed well-nourished patient in NAD. HEENT reveals PERLA, EOMI, discs not visualized.  Oral cavity is clear. No oral mucosal lesions are identified. Neck is clear without evidence of cervical or supraclavicular adenopathy. Lungs are clear to A&P. Cardiac examination is essentially unremarkable with regular rate and rhythm without murmur rub or thrill. Abdomen is benign with no organomegaly or masses noted. Motor sensory and DTR levels are equal and symmetric in the upper and lower extremities. Cranial nerves II through XII are grossly intact. Proprioception is intact. No peripheral adenopathy or edema is identified. No motor or sensory levels are noted. Crude visual fields are within normal range.  LABORATORY DATA: Pathology report reviewed    RADIOLOGY RESULTS: MRI scan and PET CT scan reviewed compatible with above-stated findings   IMPRESSION: Recurrent disease in prostate apex as well  as possible obturator lymph node in patient treated 12 years prior with I-125 interstitial implant for adenocarcinoma the prostate  PLAN: At this time elected ahead with salvage radiation therapy.  Based on the prior seed implant we treat this 30 Gray over 7 weeks to both to the obturator node as well as a area of hypermetabolic cavity on PET CT scan.  Would use PET CT fusion study for his treatment planning to treat his remaining pelvic lymph nodes to o 50 cGy in 35 fractions using IMRT treatment planning and delivery.  Risks and benefits of treatment including increased lower urinary tract symptoms possible diarrhea fatigue alteration of blood counts skin reaction all reviewed with the patient and his wife.  I have also asked patient to return to Dr. London Pepper office for 26-monthEligard injection.  Patient comprehends my recommendations well.  I have personally set up and ordered CT simulation later this week.  I would like to take this opportunity to thank you for allowing me to participate in the care of your patient..[name] MD

## 2020-11-22 NOTE — Telephone Encounter (Signed)
-----   Message from Daiva Huge, RN sent at 11/22/2020 10:31 AM EDT ----- Regarding: Eligard Good morning!  Mr. Rodney Suarez will need to be started on Eligard with Dr. Erlene Quan.    Thank you!  [facility]

## 2020-11-24 ENCOUNTER — Ambulatory Visit
Admission: RE | Admit: 2020-11-24 | Discharge: 2020-11-24 | Disposition: A | Discharge status: Home / Self Care | Payer: Medicare HMO | Source: Ambulatory Visit | Attending: Radiation Oncology | Admitting: Radiation Oncology

## 2020-11-24 DIAGNOSIS — C61 Malignant neoplasm of prostate: Secondary | ICD-10-CM | POA: Diagnosis not present

## 2020-11-24 DIAGNOSIS — Z510 Encounter for antineoplastic radiation therapy: Secondary | ICD-10-CM | POA: Diagnosis not present

## 2020-11-24 NOTE — Telephone Encounter (Signed)
Incoming approval of Eligard for patient from [employer] # [id number]. Approval dates 11/22/2020 - 05/25/2021. Pt informed and scheduled.

## 2020-11-25 ENCOUNTER — Other Ambulatory Visit: Payer: Self-pay | Admitting: *Deleted

## 2020-11-25 DIAGNOSIS — C61 Malignant neoplasm of prostate: Secondary | ICD-10-CM

## 2020-11-25 DIAGNOSIS — Z8546 Personal history of malignant neoplasm of prostate: Secondary | ICD-10-CM

## 2020-11-29 DIAGNOSIS — Z510 Encounter for antineoplastic radiation therapy: Secondary | ICD-10-CM | POA: Diagnosis not present

## 2020-11-29 DIAGNOSIS — C61 Malignant neoplasm of prostate: Secondary | ICD-10-CM | POA: Diagnosis not present

## 2020-12-01 ENCOUNTER — Ambulatory Visit: Admission: RE | Admit: 2020-12-01 | Payer: Medicare HMO | Source: Ambulatory Visit

## 2020-12-02 ENCOUNTER — Encounter: Payer: Medicare HMO | Admitting: Internal Medicine

## 2020-12-02 ENCOUNTER — Ambulatory Visit
Admission: RE | Admit: 2020-12-02 | Discharge: 2020-12-02 | Disposition: A | Discharge status: Home / Self Care | Payer: Medicare HMO | Source: Ambulatory Visit | Attending: Radiation Oncology | Admitting: Radiation Oncology

## 2020-12-02 DIAGNOSIS — Z510 Encounter for antineoplastic radiation therapy: Secondary | ICD-10-CM | POA: Insufficient documentation

## 2020-12-02 DIAGNOSIS — C61 Malignant neoplasm of prostate: Secondary | ICD-10-CM | POA: Insufficient documentation

## 2020-12-02 NOTE — Progress Notes (Signed)
12/03/2020 9:02 AM   Rodney Suarez 25-Dec-1940 [id number]  Referring provider: Venia Carbon, MD [address] [place],  [place] [address]  Urological history: 1. Prostate cancer -PSA 15 in 10/2020 -recurrent disease in prostate apex as well as possible obturator lymph node in patient treated 12 years prior with I-125 interstitial implant for adenocarcinoma the prostate -started salvage radiation 12/02/2020  Chief Complaint  Patient presents with   Prostate Cancer    HPI: Rodney Suarez is a 80 y.o. male who presents today to start ADT.  He has no questions at this time.    PMH: Past Medical History:  Diagnosis Date   Allergy    BPH (benign prostatic hypertrophy)    Diabetes mellitus    ED (erectile dysfunction)    History of prostate cancer    Hx of adenomatous colonic polyps    Hyperlipidemia    Hypertension    Osteoarthritis     Surgical History: Past Surgical History:  Procedure Laterality Date   APPENDECTOMY  1956   FOOT FRACTURE SURGERY  1996   KNEE ARTHROPLASTY Right 03/06/2016   Procedure: COMPUTER ASSISTED TOTAL KNEE ARTHROPLASTY;  Surgeon: Rodney Leep, MD;  Location: [facility] OR[facility];  Service: Orthopedics;  Laterality: Right;   KNEE ARTHROSCOPY  11/2005   right knee   PROSTATE SURGERY  01/2009   I-131 implants   SINUS SURGERY WITH INSTATRAK  09/2007    Home Medications:  Allergies as of 12/03/2020       Reactions   Meloxicam Other (See Comments)   Cramps at night   Metformin    REACTION: diarrhea        Medication List        Accurate as of December 03, 2020  9:02 AM. If you have any questions, ask your nurse or doctor.          amLODipine 10 MG tablet Commonly known as: NORVASC TAKE 1 TABLET BY MOUTH EVERY DAY   aspirin EC 81 MG tablet Take 81 mg by mouth daily.   Klor-Con M20 20 MEQ tablet Generic drug: potassium chloride SA TAKE 1 TABLET BY MOUTH EVERY DAY   lisinopril-hydrochlorothiazide 20-12.5 MG  tablet Commonly known as: ZESTORETIC TAKE 2 TABLETS BY MOUTH EVERY DAY   LORazepam 1 MG tablet Commonly known as: ATIVAN TAKE 1 TO 2 TABLETS BY MOUTH AT BEDTIME AS NEEDED FOR SLEEP   pravastatin 20 MG tablet Commonly known as: PRAVACHOL TAKE 1 TABLET BY MOUTH EVERY DAY        Allergies:  Allergies  Allergen Reactions   Meloxicam Other (See Comments)    Cramps at night   Metformin     REACTION: diarrhea    Family History: No family history on file.  Social History:  reports that he quit smoking about 38 years ago. His smoking use included cigarettes. He has never used smokeless tobacco. He reports that he does not drink alcohol and does not use drugs.  ROS: Pertinent ROS in HPI  Physical Exam: BP 102/66   Pulse (!) 105   Ht '6\' 1"'$  (1.854 m)   Wt 214 lb (97.1 kg)   BMI 28.23 kg/m   Constitutional:  Well nourished. Alert and oriented, No acute distress. HEENT: [place] AT, mask  in place.  Trachea midline Cardiovascular: No clubbing, cyanosis, or edema. Respiratory: Normal respiratory effort, no increased work of breathing. Neurologic: Grossly intact, no focal deficits, moving all 4 extremities. Psychiatric: Normal mood and affect.  Laboratory Data:  Lab Results  Component Value Date   WBC 4.8 07/14/2020   HGB 14.1 07/14/2020   HCT 40.8 07/14/2020   MCV 97.1 07/14/2020   PLT 184 07/14/2020    Lab Results  Component Value Date   HGBA1C 6.4 (A) 01/21/2020   I have reviewed the labs.   Pertinent Imaging: N/A  Assessment & Plan:    1. Prostate cancer -Discussed with patient injection site reactions could include transient burning and stinging, pain, bruising and redness -Discussed with patient that common side effects include hot flashes, sweats, fatigue, weakness, muscle pain, dizziness, clamminess, testicular shrinkage, decreased erections and enlargement of breasts -Discussed the side effect of thinning of the bones that may lead to a fracture and it is  important that he start calcium supplementation with vitamin D -Discussed the increased risks of heart attack, irregular heartbeat, sudden death due to heart problems and stroke -Discussed that elevated blood sugar and increased risk developing diabetes may occur   Return in about 6 months (around 06/02/2021) for keep appointments wtih Dr. Lamarr Suarez in February .  These notes generated with voice recognition software. I apologize for typographical errors.  Rodney Council, PA-C  [facility] [address]  [place], [place] [address] [phone]   I spent 15 minutes on the day of the encounter to include pre-visit record review, face-to-face time with the patient, and post-visit ordering of tests.

## 2020-12-03 ENCOUNTER — Ambulatory Visit
Admission: RE | Admit: 2020-12-03 | Discharge: 2020-12-03 | Disposition: A | Discharge status: Home / Self Care | Payer: Medicare HMO | Source: Ambulatory Visit | Attending: Radiation Oncology | Admitting: Radiation Oncology

## 2020-12-03 ENCOUNTER — Other Ambulatory Visit: Payer: Self-pay

## 2020-12-03 ENCOUNTER — Encounter: Payer: Self-pay | Admitting: Urology

## 2020-12-03 ENCOUNTER — Ambulatory Visit: Payer: Medicare HMO | Admitting: Urology

## 2020-12-03 VITALS — BP 102/66 | HR 105 | Ht 73.0 in | Wt 214.0 lb

## 2020-12-03 DIAGNOSIS — C61 Malignant neoplasm of prostate: Secondary | ICD-10-CM | POA: Diagnosis not present

## 2020-12-03 DIAGNOSIS — Z510 Encounter for antineoplastic radiation therapy: Secondary | ICD-10-CM | POA: Diagnosis not present

## 2020-12-03 MED ORDER — LEUPROLIDE ACETATE (6 MONTH) 45 MG ~~LOC~~ KIT
45.0000 mg | PACK | Freq: Once | SUBCUTANEOUS | Status: AC
Start: 2020-12-03 — End: 2020-12-03
  Administered 2020-12-03: 45 mg via SUBCUTANEOUS

## 2020-12-03 NOTE — Progress Notes (Signed)
Eligard SubQ Injection   Due to Prostate Cancer patient is present today for a Eligard Injection.  Medication: Eligard 6 month Dose: 45 mg  Location: right upper outer buttocks Lot: [id number] Exp: 04/2022  Patient tolerated well, no complications were noted  Performed by: Bradly Bienenstock, [place]  Per Larene Beach, patient is to continue therapy for 6 months . Patient's next follow up was scheduled for 05/31/21. This appointment was scheduled using wheel and given to patient today along with reminder continue on Vitamin D 800-1000iu and Calium 1000-'1200mg'$  daily while on Androgen Deprivation Therapy.  PA approval dates:  11/22/20-05/25/21

## 2020-12-03 NOTE — Patient Instructions (Signed)
Take 1200 mg of calcium daily.  It is best to take it in divided doses, ideally 400 mg three times daily with food.  Take 400 mg of Vitamin D daily.  It is best to take this with a fatty meal and it can be taken once daily as it is fat soluble.

## 2020-12-07 ENCOUNTER — Ambulatory Visit
Admission: RE | Admit: 2020-12-07 | Discharge: 2020-12-07 | Disposition: A | Discharge status: Home / Self Care | Payer: Medicare HMO | Source: Ambulatory Visit | Attending: Radiation Oncology | Admitting: Radiation Oncology

## 2020-12-07 DIAGNOSIS — C61 Malignant neoplasm of prostate: Secondary | ICD-10-CM | POA: Diagnosis not present

## 2020-12-07 DIAGNOSIS — Z510 Encounter for antineoplastic radiation therapy: Secondary | ICD-10-CM | POA: Diagnosis not present

## 2020-12-08 ENCOUNTER — Ambulatory Visit
Admission: RE | Admit: 2020-12-08 | Discharge: 2020-12-08 | Disposition: A | Discharge status: Home / Self Care | Payer: Medicare HMO | Source: Ambulatory Visit | Attending: Radiation Oncology | Admitting: Radiation Oncology

## 2020-12-08 DIAGNOSIS — C61 Malignant neoplasm of prostate: Secondary | ICD-10-CM | POA: Diagnosis not present

## 2020-12-08 DIAGNOSIS — Z510 Encounter for antineoplastic radiation therapy: Secondary | ICD-10-CM | POA: Diagnosis not present

## 2020-12-09 ENCOUNTER — Ambulatory Visit
Admission: RE | Admit: 2020-12-09 | Discharge: 2020-12-09 | Disposition: A | Discharge status: Home / Self Care | Payer: Medicare HMO | Source: Ambulatory Visit | Attending: Radiation Oncology | Admitting: Radiation Oncology

## 2020-12-09 DIAGNOSIS — Z510 Encounter for antineoplastic radiation therapy: Secondary | ICD-10-CM | POA: Diagnosis not present

## 2020-12-09 DIAGNOSIS — C61 Malignant neoplasm of prostate: Secondary | ICD-10-CM | POA: Diagnosis not present

## 2020-12-10 ENCOUNTER — Ambulatory Visit
Admission: RE | Admit: 2020-12-10 | Discharge: 2020-12-10 | Disposition: A | Discharge status: Home / Self Care | Payer: Medicare HMO | Source: Ambulatory Visit | Attending: Radiation Oncology | Admitting: Radiation Oncology

## 2020-12-10 DIAGNOSIS — C61 Malignant neoplasm of prostate: Secondary | ICD-10-CM | POA: Diagnosis not present

## 2020-12-10 DIAGNOSIS — Z510 Encounter for antineoplastic radiation therapy: Secondary | ICD-10-CM | POA: Diagnosis not present

## 2020-12-13 ENCOUNTER — Ambulatory Visit
Admission: RE | Admit: 2020-12-13 | Discharge: 2020-12-13 | Disposition: A | Discharge status: Home / Self Care | Payer: Medicare HMO | Source: Ambulatory Visit | Attending: Radiation Oncology | Admitting: Radiation Oncology

## 2020-12-13 DIAGNOSIS — C61 Malignant neoplasm of prostate: Secondary | ICD-10-CM | POA: Diagnosis not present

## 2020-12-13 DIAGNOSIS — Z510 Encounter for antineoplastic radiation therapy: Secondary | ICD-10-CM | POA: Diagnosis not present

## 2020-12-14 ENCOUNTER — Ambulatory Visit
Admission: RE | Admit: 2020-12-14 | Discharge: 2020-12-14 | Disposition: A | Discharge status: Home / Self Care | Payer: Medicare HMO | Source: Ambulatory Visit | Attending: Radiation Oncology | Admitting: Radiation Oncology

## 2020-12-14 DIAGNOSIS — C61 Malignant neoplasm of prostate: Secondary | ICD-10-CM | POA: Diagnosis not present

## 2020-12-14 DIAGNOSIS — Z510 Encounter for antineoplastic radiation therapy: Secondary | ICD-10-CM | POA: Diagnosis not present

## 2020-12-15 ENCOUNTER — Ambulatory Visit
Admission: RE | Admit: 2020-12-15 | Discharge: 2020-12-15 | Disposition: A | Discharge status: Home / Self Care | Payer: Medicare HMO | Source: Ambulatory Visit | Attending: Radiation Oncology | Admitting: Radiation Oncology

## 2020-12-15 DIAGNOSIS — C61 Malignant neoplasm of prostate: Secondary | ICD-10-CM | POA: Diagnosis not present

## 2020-12-15 DIAGNOSIS — Z510 Encounter for antineoplastic radiation therapy: Secondary | ICD-10-CM | POA: Diagnosis not present

## 2020-12-16 ENCOUNTER — Ambulatory Visit
Admission: RE | Admit: 2020-12-16 | Discharge: 2020-12-16 | Disposition: A | Discharge status: Home / Self Care | Payer: Medicare HMO | Source: Ambulatory Visit | Attending: Radiation Oncology | Admitting: Radiation Oncology

## 2020-12-16 ENCOUNTER — Inpatient Hospital Stay: Payer: Medicare HMO | Attending: Radiation Oncology

## 2020-12-16 DIAGNOSIS — Z510 Encounter for antineoplastic radiation therapy: Secondary | ICD-10-CM | POA: Diagnosis not present

## 2020-12-16 DIAGNOSIS — C61 Malignant neoplasm of prostate: Secondary | ICD-10-CM | POA: Diagnosis not present

## 2020-12-17 ENCOUNTER — Ambulatory Visit
Admission: RE | Admit: 2020-12-17 | Discharge: 2020-12-17 | Disposition: A | Discharge status: Home / Self Care | Payer: Medicare HMO | Source: Ambulatory Visit | Attending: Radiation Oncology | Admitting: Radiation Oncology

## 2020-12-17 DIAGNOSIS — Z510 Encounter for antineoplastic radiation therapy: Secondary | ICD-10-CM | POA: Diagnosis not present

## 2020-12-17 DIAGNOSIS — C61 Malignant neoplasm of prostate: Secondary | ICD-10-CM | POA: Diagnosis not present

## 2020-12-20 ENCOUNTER — Ambulatory Visit
Admission: RE | Admit: 2020-12-20 | Discharge: 2020-12-20 | Disposition: A | Discharge status: Home / Self Care | Payer: Medicare HMO | Source: Ambulatory Visit | Attending: Radiation Oncology | Admitting: Radiation Oncology

## 2020-12-20 DIAGNOSIS — Z510 Encounter for antineoplastic radiation therapy: Secondary | ICD-10-CM | POA: Diagnosis not present

## 2020-12-20 DIAGNOSIS — C61 Malignant neoplasm of prostate: Secondary | ICD-10-CM | POA: Diagnosis not present

## 2020-12-21 ENCOUNTER — Ambulatory Visit
Admission: RE | Admit: 2020-12-21 | Discharge: 2020-12-21 | Disposition: A | Discharge status: Home / Self Care | Payer: Medicare HMO | Source: Ambulatory Visit | Attending: Radiation Oncology | Admitting: Radiation Oncology

## 2020-12-21 DIAGNOSIS — Z510 Encounter for antineoplastic radiation therapy: Secondary | ICD-10-CM | POA: Diagnosis not present

## 2020-12-21 DIAGNOSIS — C61 Malignant neoplasm of prostate: Secondary | ICD-10-CM | POA: Diagnosis not present

## 2020-12-22 ENCOUNTER — Ambulatory Visit
Admission: RE | Admit: 2020-12-22 | Discharge: 2020-12-22 | Disposition: A | Discharge status: Home / Self Care | Payer: Medicare HMO | Source: Ambulatory Visit | Attending: Radiation Oncology | Admitting: Radiation Oncology

## 2020-12-22 DIAGNOSIS — C61 Malignant neoplasm of prostate: Secondary | ICD-10-CM | POA: Diagnosis not present

## 2020-12-22 DIAGNOSIS — Z510 Encounter for antineoplastic radiation therapy: Secondary | ICD-10-CM | POA: Diagnosis not present

## 2020-12-23 ENCOUNTER — Ambulatory Visit
Admission: RE | Admit: 2020-12-23 | Discharge: 2020-12-23 | Disposition: A | Discharge status: Home / Self Care | Payer: Medicare HMO | Source: Ambulatory Visit | Attending: Radiation Oncology | Admitting: Radiation Oncology

## 2020-12-23 DIAGNOSIS — C61 Malignant neoplasm of prostate: Secondary | ICD-10-CM | POA: Diagnosis not present

## 2020-12-23 DIAGNOSIS — Z510 Encounter for antineoplastic radiation therapy: Secondary | ICD-10-CM | POA: Diagnosis not present

## 2020-12-24 ENCOUNTER — Ambulatory Visit
Admission: RE | Admit: 2020-12-24 | Discharge: 2020-12-24 | Disposition: A | Discharge status: Home / Self Care | Payer: Medicare HMO | Source: Ambulatory Visit | Attending: Radiation Oncology | Admitting: Radiation Oncology

## 2020-12-24 DIAGNOSIS — Z510 Encounter for antineoplastic radiation therapy: Secondary | ICD-10-CM | POA: Diagnosis not present

## 2020-12-24 DIAGNOSIS — C61 Malignant neoplasm of prostate: Secondary | ICD-10-CM | POA: Diagnosis not present

## 2020-12-27 ENCOUNTER — Ambulatory Visit
Admission: RE | Admit: 2020-12-27 | Discharge: 2020-12-27 | Disposition: A | Discharge status: Home / Self Care | Payer: Medicare HMO | Source: Ambulatory Visit | Attending: Radiation Oncology | Admitting: Radiation Oncology

## 2020-12-27 DIAGNOSIS — Z510 Encounter for antineoplastic radiation therapy: Secondary | ICD-10-CM | POA: Diagnosis not present

## 2020-12-27 DIAGNOSIS — H348312 Tributary (branch) retinal vein occlusion, right eye, stable: Secondary | ICD-10-CM | POA: Diagnosis not present

## 2020-12-27 DIAGNOSIS — C61 Malignant neoplasm of prostate: Secondary | ICD-10-CM | POA: Diagnosis not present

## 2020-12-28 ENCOUNTER — Ambulatory Visit
Admission: RE | Admit: 2020-12-28 | Discharge: 2020-12-28 | Disposition: A | Discharge status: Home / Self Care | Payer: Medicare HMO | Source: Ambulatory Visit | Attending: Radiation Oncology | Admitting: Radiation Oncology

## 2020-12-28 DIAGNOSIS — C61 Malignant neoplasm of prostate: Secondary | ICD-10-CM | POA: Diagnosis not present

## 2020-12-28 DIAGNOSIS — Z510 Encounter for antineoplastic radiation therapy: Secondary | ICD-10-CM | POA: Diagnosis not present

## 2020-12-29 ENCOUNTER — Ambulatory Visit
Admission: RE | Admit: 2020-12-29 | Discharge: 2020-12-29 | Disposition: A | Discharge status: Home / Self Care | Payer: Medicare HMO | Source: Ambulatory Visit | Attending: Radiation Oncology | Admitting: Radiation Oncology

## 2020-12-29 DIAGNOSIS — Z510 Encounter for antineoplastic radiation therapy: Secondary | ICD-10-CM | POA: Diagnosis not present

## 2020-12-29 DIAGNOSIS — C61 Malignant neoplasm of prostate: Secondary | ICD-10-CM | POA: Diagnosis not present

## 2020-12-30 ENCOUNTER — Inpatient Hospital Stay: Payer: Medicare HMO

## 2020-12-30 ENCOUNTER — Ambulatory Visit
Admission: RE | Admit: 2020-12-30 | Discharge: 2020-12-30 | Disposition: A | Discharge status: Home / Self Care | Payer: Medicare HMO | Source: Ambulatory Visit | Attending: Radiation Oncology | Admitting: Radiation Oncology

## 2020-12-30 ENCOUNTER — Other Ambulatory Visit: Payer: Self-pay

## 2020-12-30 DIAGNOSIS — Z510 Encounter for antineoplastic radiation therapy: Secondary | ICD-10-CM | POA: Diagnosis not present

## 2020-12-30 DIAGNOSIS — C61 Malignant neoplasm of prostate: Secondary | ICD-10-CM | POA: Diagnosis not present

## 2020-12-30 LAB — CBC
HCT: 43.3 % (ref 39.0–52.0)
Hemoglobin: 14.9 g/dL (ref 13.0–17.0)
MCH: 33.2 pg (ref 26.0–34.0)
MCHC: 34.4 g/dL (ref 30.0–36.0)
MCV: 96.4 fL (ref 80.0–100.0)
Platelets: 117 10*3/uL — ABNORMAL LOW (ref 150–400)
RBC: 4.49 MIL/uL (ref 4.22–5.81)
RDW: 13.1 % (ref 11.5–15.5)
WBC: 3.6 10*3/uL — ABNORMAL LOW (ref 4.0–10.5)
nRBC: 0 % (ref 0.0–0.2)

## 2020-12-31 ENCOUNTER — Ambulatory Visit
Admission: RE | Admit: 2020-12-31 | Discharge: 2020-12-31 | Disposition: A | Discharge status: Home / Self Care | Payer: Medicare HMO | Source: Ambulatory Visit | Attending: Radiation Oncology | Admitting: Radiation Oncology

## 2020-12-31 DIAGNOSIS — C61 Malignant neoplasm of prostate: Secondary | ICD-10-CM | POA: Diagnosis not present

## 2020-12-31 DIAGNOSIS — Z510 Encounter for antineoplastic radiation therapy: Secondary | ICD-10-CM | POA: Diagnosis not present

## 2021-01-03 ENCOUNTER — Ambulatory Visit
Admission: RE | Admit: 2021-01-03 | Discharge: 2021-01-03 | Disposition: A | Discharge status: Home / Self Care | Payer: Medicare HMO | Source: Ambulatory Visit | Attending: Radiation Oncology | Admitting: Radiation Oncology

## 2021-01-03 DIAGNOSIS — Z510 Encounter for antineoplastic radiation therapy: Secondary | ICD-10-CM | POA: Insufficient documentation

## 2021-01-03 DIAGNOSIS — C61 Malignant neoplasm of prostate: Secondary | ICD-10-CM | POA: Diagnosis not present

## 2021-01-04 ENCOUNTER — Ambulatory Visit
Admission: RE | Admit: 2021-01-04 | Discharge: 2021-01-04 | Disposition: A | Discharge status: Home / Self Care | Payer: Medicare HMO | Source: Ambulatory Visit | Attending: Radiation Oncology | Admitting: Radiation Oncology

## 2021-01-04 DIAGNOSIS — C61 Malignant neoplasm of prostate: Secondary | ICD-10-CM | POA: Diagnosis not present

## 2021-01-04 DIAGNOSIS — Z510 Encounter for antineoplastic radiation therapy: Secondary | ICD-10-CM | POA: Diagnosis not present

## 2021-01-05 ENCOUNTER — Ambulatory Visit
Admission: RE | Admit: 2021-01-05 | Discharge: 2021-01-05 | Disposition: A | Discharge status: Home / Self Care | Payer: Medicare HMO | Source: Ambulatory Visit | Attending: Radiation Oncology | Admitting: Radiation Oncology

## 2021-01-05 DIAGNOSIS — Z510 Encounter for antineoplastic radiation therapy: Secondary | ICD-10-CM | POA: Diagnosis not present

## 2021-01-05 DIAGNOSIS — C61 Malignant neoplasm of prostate: Secondary | ICD-10-CM | POA: Diagnosis not present

## 2021-01-06 ENCOUNTER — Ambulatory Visit
Admission: RE | Admit: 2021-01-06 | Discharge: 2021-01-06 | Disposition: A | Discharge status: Home / Self Care | Payer: Medicare HMO | Source: Ambulatory Visit | Attending: Radiation Oncology | Admitting: Radiation Oncology

## 2021-01-06 DIAGNOSIS — Z510 Encounter for antineoplastic radiation therapy: Secondary | ICD-10-CM | POA: Diagnosis not present

## 2021-01-06 DIAGNOSIS — C61 Malignant neoplasm of prostate: Secondary | ICD-10-CM | POA: Diagnosis not present

## 2021-01-07 ENCOUNTER — Other Ambulatory Visit: Payer: Self-pay | Admitting: Internal Medicine

## 2021-01-07 ENCOUNTER — Ambulatory Visit
Admission: RE | Admit: 2021-01-07 | Discharge: 2021-01-07 | Disposition: A | Discharge status: Home / Self Care | Payer: Medicare HMO | Source: Ambulatory Visit | Attending: Radiation Oncology | Admitting: Radiation Oncology

## 2021-01-07 DIAGNOSIS — Z510 Encounter for antineoplastic radiation therapy: Secondary | ICD-10-CM | POA: Diagnosis not present

## 2021-01-07 DIAGNOSIS — C61 Malignant neoplasm of prostate: Secondary | ICD-10-CM | POA: Diagnosis not present

## 2021-01-10 ENCOUNTER — Ambulatory Visit
Admission: RE | Admit: 2021-01-10 | Discharge: 2021-01-10 | Disposition: A | Discharge status: Home / Self Care | Payer: Medicare HMO | Source: Ambulatory Visit | Attending: Radiation Oncology | Admitting: Radiation Oncology

## 2021-01-10 DIAGNOSIS — C61 Malignant neoplasm of prostate: Secondary | ICD-10-CM | POA: Diagnosis not present

## 2021-01-10 DIAGNOSIS — Z510 Encounter for antineoplastic radiation therapy: Secondary | ICD-10-CM | POA: Diagnosis not present

## 2021-01-10 NOTE — Telephone Encounter (Signed)
Last filled 09-27-20 #30 Last OV 04-30-20 Next OV [facility]04-21 [facility] [address]

## 2021-01-11 ENCOUNTER — Ambulatory Visit
Admission: RE | Admit: 2021-01-11 | Discharge: 2021-01-11 | Disposition: A | Discharge status: Home / Self Care | Payer: Medicare HMO | Source: Ambulatory Visit | Attending: Radiation Oncology | Admitting: Radiation Oncology

## 2021-01-11 DIAGNOSIS — C61 Malignant neoplasm of prostate: Secondary | ICD-10-CM | POA: Diagnosis not present

## 2021-01-11 DIAGNOSIS — Z510 Encounter for antineoplastic radiation therapy: Secondary | ICD-10-CM | POA: Diagnosis not present

## 2021-01-12 ENCOUNTER — Ambulatory Visit
Admission: RE | Admit: 2021-01-12 | Discharge: 2021-01-12 | Disposition: A | Discharge status: Home / Self Care | Payer: Medicare HMO | Source: Ambulatory Visit | Attending: Radiation Oncology | Admitting: Radiation Oncology

## 2021-01-12 DIAGNOSIS — C61 Malignant neoplasm of prostate: Secondary | ICD-10-CM | POA: Diagnosis not present

## 2021-01-12 DIAGNOSIS — Z510 Encounter for antineoplastic radiation therapy: Secondary | ICD-10-CM | POA: Diagnosis not present

## 2021-01-13 ENCOUNTER — Inpatient Hospital Stay: Payer: Medicare HMO | Attending: Radiation Oncology

## 2021-01-13 ENCOUNTER — Ambulatory Visit
Admission: RE | Admit: 2021-01-13 | Discharge: 2021-01-13 | Disposition: A | Discharge status: Home / Self Care | Payer: Medicare HMO | Source: Ambulatory Visit | Attending: Radiation Oncology | Admitting: Radiation Oncology

## 2021-01-13 DIAGNOSIS — C61 Malignant neoplasm of prostate: Secondary | ICD-10-CM | POA: Insufficient documentation

## 2021-01-13 DIAGNOSIS — Z510 Encounter for antineoplastic radiation therapy: Secondary | ICD-10-CM | POA: Diagnosis not present

## 2021-01-13 LAB — CBC
HCT: 40.3 % (ref 39.0–52.0)
Hemoglobin: 14 g/dL (ref 13.0–17.0)
MCH: 33.3 pg (ref 26.0–34.0)
MCHC: 34.7 g/dL (ref 30.0–36.0)
MCV: 95.7 fL (ref 80.0–100.0)
Platelets: 177 10*3/uL (ref 150–400)
RBC: 4.21 MIL/uL — ABNORMAL LOW (ref 4.22–5.81)
RDW: 13.2 % (ref 11.5–15.5)
WBC: 3.4 10*3/uL — ABNORMAL LOW (ref 4.0–10.5)
nRBC: 0 % (ref 0.0–0.2)

## 2021-01-14 ENCOUNTER — Ambulatory Visit
Admission: RE | Admit: 2021-01-14 | Discharge: 2021-01-14 | Disposition: A | Discharge status: Home / Self Care | Payer: Medicare HMO | Source: Ambulatory Visit | Attending: Radiation Oncology | Admitting: Radiation Oncology

## 2021-01-14 DIAGNOSIS — Z510 Encounter for antineoplastic radiation therapy: Secondary | ICD-10-CM | POA: Diagnosis not present

## 2021-01-14 DIAGNOSIS — C61 Malignant neoplasm of prostate: Secondary | ICD-10-CM | POA: Diagnosis not present

## 2021-01-17 ENCOUNTER — Ambulatory Visit
Admission: RE | Admit: 2021-01-17 | Discharge: 2021-01-17 | Disposition: A | Discharge status: Home / Self Care | Payer: Medicare HMO | Source: Ambulatory Visit | Attending: Radiation Oncology | Admitting: Radiation Oncology

## 2021-01-17 DIAGNOSIS — C61 Malignant neoplasm of prostate: Secondary | ICD-10-CM | POA: Diagnosis not present

## 2021-01-17 DIAGNOSIS — Z510 Encounter for antineoplastic radiation therapy: Secondary | ICD-10-CM | POA: Diagnosis not present

## 2021-01-18 ENCOUNTER — Ambulatory Visit
Admission: RE | Admit: 2021-01-18 | Discharge: 2021-01-18 | Disposition: A | Discharge status: Home / Self Care | Payer: Medicare HMO | Source: Ambulatory Visit | Attending: Radiation Oncology | Admitting: Radiation Oncology

## 2021-01-18 DIAGNOSIS — Z510 Encounter for antineoplastic radiation therapy: Secondary | ICD-10-CM | POA: Diagnosis not present

## 2021-01-18 DIAGNOSIS — C61 Malignant neoplasm of prostate: Secondary | ICD-10-CM | POA: Diagnosis not present

## 2021-01-19 ENCOUNTER — Ambulatory Visit
Admission: RE | Admit: 2021-01-19 | Discharge: 2021-01-19 | Disposition: A | Discharge status: Home / Self Care | Payer: Medicare HMO | Source: Ambulatory Visit | Attending: Radiation Oncology | Admitting: Radiation Oncology

## 2021-01-19 DIAGNOSIS — C61 Malignant neoplasm of prostate: Secondary | ICD-10-CM | POA: Diagnosis not present

## 2021-01-19 DIAGNOSIS — Z510 Encounter for antineoplastic radiation therapy: Secondary | ICD-10-CM | POA: Diagnosis not present

## 2021-01-20 ENCOUNTER — Ambulatory Visit
Admission: RE | Admit: 2021-01-20 | Discharge: 2021-01-20 | Disposition: A | Discharge status: Home / Self Care | Payer: Medicare HMO | Source: Ambulatory Visit | Attending: Radiation Oncology | Admitting: Radiation Oncology

## 2021-01-20 DIAGNOSIS — Z510 Encounter for antineoplastic radiation therapy: Secondary | ICD-10-CM | POA: Diagnosis not present

## 2021-01-20 DIAGNOSIS — C61 Malignant neoplasm of prostate: Secondary | ICD-10-CM | POA: Diagnosis not present

## 2021-02-04 ENCOUNTER — Other Ambulatory Visit: Payer: Self-pay | Admitting: Internal Medicine

## 2021-02-04 ENCOUNTER — Encounter: Payer: Self-pay | Admitting: Internal Medicine

## 2021-02-04 ENCOUNTER — Ambulatory Visit (INDEPENDENT_AMBULATORY_CARE_PROVIDER_SITE_OTHER): Payer: Medicare HMO | Admitting: Internal Medicine

## 2021-02-04 ENCOUNTER — Other Ambulatory Visit: Payer: Self-pay

## 2021-02-04 VITALS — BP 110/62 | HR 68 | Temp 97.3°F | Ht 70.5 in | Wt 212.0 lb

## 2021-02-04 DIAGNOSIS — Z23 Encounter for immunization: Secondary | ICD-10-CM

## 2021-02-04 DIAGNOSIS — E1159 Type 2 diabetes mellitus with other circulatory complications: Secondary | ICD-10-CM

## 2021-02-04 DIAGNOSIS — E785 Hyperlipidemia, unspecified: Secondary | ICD-10-CM

## 2021-02-04 DIAGNOSIS — I10 Essential (primary) hypertension: Secondary | ICD-10-CM

## 2021-02-04 DIAGNOSIS — R413 Other amnesia: Secondary | ICD-10-CM | POA: Diagnosis not present

## 2021-02-04 DIAGNOSIS — Z0000 Encounter for general adult medical examination without abnormal findings: Secondary | ICD-10-CM

## 2021-02-04 DIAGNOSIS — C61 Malignant neoplasm of prostate: Secondary | ICD-10-CM | POA: Diagnosis not present

## 2021-02-04 LAB — COMPREHENSIVE METABOLIC PANEL
ALT: 34 U/L (ref 0–53)
AST: 31 U/L (ref 0–37)
Albumin: 4.6 g/dL (ref 3.5–5.2)
Alkaline Phosphatase: 62 U/L (ref 39–117)
BUN: 16 mg/dL (ref 6–23)
CO2: 30 mEq/L (ref 19–32)
Calcium: 9.6 mg/dL (ref 8.4–10.5)
Chloride: 101 mEq/L (ref 96–112)
Creatinine, Ser: 1.04 mg/dL (ref 0.40–1.50)
GFR: 67.04 mL/min (ref >60.00)
Glucose, Bld: 159 mg/dL — ABNORMAL HIGH (ref 70–99)
Potassium: 4.1 mEq/L (ref 3.5–5.1)
Sodium: 138 mEq/L (ref 135–145)
Total Bilirubin: 1.4 mg/dL — ABNORMAL HIGH (ref 0.2–1.2)
Total Protein: 7.7 g/dL (ref 6.0–8.3)

## 2021-02-04 LAB — CBC
HCT: 42.6 % (ref 39.0–52.0)
Hemoglobin: 14.6 g/dL (ref 13.0–17.0)
MCHC: 34.2 g/dL (ref 30.0–36.0)
MCV: 97.3 fl (ref 78.0–100.0)
Platelets: 162 10*3/uL (ref 150.0–400.0)
RBC: 4.38 Mil/uL (ref 4.22–5.81)
RDW: 14.7 % (ref 11.5–15.5)
WBC: 4 10*3/uL (ref 4.0–10.5)

## 2021-02-04 LAB — LIPID PANEL
Cholesterol: 196 mg/dL (ref 0–200)
HDL: 49 mg/dL (ref >39.00)
LDL Cholesterol: 108 mg/dL — ABNORMAL HIGH (ref 0–99)
NonHDL: 146.55
Total CHOL/HDL Ratio: 4
Triglycerides: 194 mg/dL — ABNORMAL HIGH (ref 0.0–149.0)
VLDL: 38.8 mg/dL (ref 0.0–40.0)

## 2021-02-04 LAB — T4, FREE: Free T4: 0.72 ng/dL (ref 0.60–1.60)

## 2021-02-04 LAB — HEMOGLOBIN A1C: Hgb A1c MFr Bld: 7.3 % — ABNORMAL HIGH (ref 4.6–6.5)

## 2021-02-04 LAB — VITAMIN B12: Vitamin B-12: 241 pg/mL (ref 211–911)

## 2021-02-04 NOTE — Progress Notes (Signed)
Hearing Screening - Comments:: Passed whisper test Vision Screening - Comments:: October 2022  

## 2021-02-04 NOTE — Assessment & Plan Note (Signed)
I have personally reviewed the Medicare Annual Wellness questionnaire and have noted 1. The patient's medical and social history 2. Their use of alcohol, tobacco or illicit drugs 3. Their current medications and supplements 4. The patient's functional ability including ADL's, fall risks, home safety risks and hearing or visual             impairment. 5. Diet and physical activities 6. Evidence for depression or mood disorders  The patients weight, height, BMI and visual acuity have been recorded in the chart I have made referrals, counseling and provided education to the patient based review of the above and I have provided the pt with a written personalized care plan for preventive services.  I have provided you with a copy of your personalized plan for preventive services. Please take the time to review along with your updated medication list.  Done with cancer screening Works out regularly Flu vaccine today Needs to get bivalent COVID vaccine Consider shingrix when covered

## 2021-02-04 NOTE — Assessment & Plan Note (Signed)
Sugars still very good without Rx Has HTN ?early neuropathy

## 2021-02-04 NOTE — Assessment & Plan Note (Signed)
Just finished RT for salvage Will have follow up soon to decide about anti-androgen Rx

## 2021-02-04 NOTE — Patient Instructions (Signed)
Get the new bivalent COVID vaccine at the pharmacy

## 2021-02-04 NOTE — Assessment & Plan Note (Signed)
BP Readings from Last 3 Encounters:  02/04/21 110/62  12/03/20 102/66  11/22/20 (!) 154/76   Good control on lisinopril/HCTZ and amlodipine Will check labs

## 2021-02-04 NOTE — Progress Notes (Signed)
Subjective:    Patient ID: Rodney Suarez, male    DOB: January 21, 1941, 80 y.o.   MRN: [id number]  HPI Here for Medicare wellness visit and follow up of chronic health conditions This visit occurred during the SARS-CoV-2 public health emergency.  Safety protocols were in place, including screening questions prior to the visit, additional usage of staff PPE, and extensive cleaning of exam room while observing appropriate contact time as indicated for disinfecting solutions.   Reviewed advanced directives Reviewed other doctors---Dr Brandon--urology, Dr Baruch Gouty --radiation oncology, [facility], No hospitalizations or surgery in the past year Continues to exercise regularly Some vision problems--needed some shots Hearing is fine No falls No depression or anhedonia Independent with instrumental ADLs No sig memory issues  Had recurrence of prostate cancer Went through course of RT for this Now notices skin is very day--has to be moisturized  Checks sugars daily when on RT Running 90-110 No foot numbness or burning. Does have some tingling in fingers  No chest pain No palpitations No dizziness or syncope No edema No SOB  Current Outpatient Medications on File Prior to Visit  Medication Sig Dispense Refill   amLODipine (NORVASC) 10 MG tablet TAKE 1 TABLET BY MOUTH EVERY DAY 90 tablet 0   aspirin EC 81 MG tablet Take 81 mg by mouth daily.     KLOR-CON M20 20 MEQ tablet TAKE 1 TABLET BY MOUTH EVERY DAY 90 tablet 3   lisinopril-hydrochlorothiazide (ZESTORETIC) 20-12.5 MG tablet TAKE 2 TABLETS BY MOUTH EVERY DAY 180 tablet 0   LORazepam (ATIVAN) 1 MG tablet TAKE 1 TO 2 TABLETS BY MOUTH AT BEDTIME AS NEEDED FOR SLEEP 60 tablet 0   pravastatin (PRAVACHOL) 20 MG tablet TAKE 1 TABLET BY MOUTH EVERY DAY 90 tablet 3   No current facility-administered medications on file prior to visit.    Allergies  Allergen Reactions   Meloxicam Other (See Comments)    Cramps at night   Metformin      REACTION: diarrhea    Past Medical History:  Diagnosis Date   Allergy    BPH (benign prostatic hypertrophy)    Diabetes mellitus    ED (erectile dysfunction)    History of prostate cancer    Hx of adenomatous colonic polyps    Hyperlipidemia    Hypertension    Osteoarthritis     Past Surgical History:  Procedure Laterality Date   APPENDECTOMY  1956   FOOT FRACTURE SURGERY  1996   KNEE ARTHROPLASTY Right 03/06/2016   Procedure: COMPUTER ASSISTED TOTAL KNEE ARTHROPLASTY;  Surgeon: Dereck Leep, MD;  Location: [facility] ORS;  Service: Orthopedics;  Laterality: Right;   KNEE ARTHROSCOPY  11/2005   right knee   PROSTATE SURGERY  01/2009   I-131 implants   SINUS SURGERY WITH INSTATRAK  09/2007    History reviewed. No pertinent family history.  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  Tobacco Use   Smoking status: Former    Types: Cigarettes    Quit date: 04/03/1982    Years since quitting: 38.8   Smokeless tobacco: Never  Substance and Sexual Activity   Alcohol use: No   Drug use: No   Sexual  activity: Not on file  Other Topics Concern   Not on file  Social History Narrative   No living will   Wife and daughter should be health care POAs   Discussed DNR--he requests this (done 10/14/12)   No tube feedings if cognitively unaware   Social Determinants of Health   Financial Resource Strain: Not on file  Food Insecurity: Not on file  Transportation Needs: Not on file  Physical Activity: Not on file  Stress: Not on file  Social Connections: Not on file  Intimate Partner Violence: Not on file   Review of Systems Appetite is good Weight is stable--watches it carefully Sleeps okay Wears seat belt Teeth are  first---partial top denture. Hasn't been seeing dentist No rash or suspicious skin lesions. Just dry skin No heartburn or dysphagia Bowels move fine--no blood No sig back or joint pains     Objective:   Physical Exam Constitutional:      Appearance: Normal appearance.  HENT:     Mouth/Throat:     Comments: No lesions Eyes:     Conjunctiva/sclera: Conjunctivae normal.     Pupils: Pupils are equal, round, and reactive to light.  Cardiovascular:     Rate and Rhythm: Normal rate and regular rhythm.     Pulses: Normal pulses.     Heart sounds: No murmur heard.   No gallop.  Pulmonary:     Effort: Pulmonary effort is normal.     Breath sounds: Normal breath sounds. No wheezing or rales.  Abdominal:     Palpations: Abdomen is soft.     Tenderness: There is no abdominal tenderness.  Musculoskeletal:     Cervical back: Neck supple.     Right lower leg: No edema.     Left lower leg: No edema.  Lymphadenopathy:     Cervical: No cervical adenopathy.  Skin:    Findings: No lesion or rash.  Neurological:     Mental Status: He is alert and oriented to person, place, and time.     Comments: Weldon Inches----?" [phone] D-l-o-w Recall 0/3  Psychiatric:        Mood and Affect: Mood normal.        Behavior: Behavior normal.           Assessment & Plan:

## 2021-02-04 NOTE — Assessment & Plan Note (Signed)
No problems with the pravastatin

## 2021-02-04 NOTE — Assessment & Plan Note (Signed)
Seems stable from 1.5 years ago Likely just MCI Will recheck labs

## 2021-02-16 ENCOUNTER — Other Ambulatory Visit: Payer: Self-pay | Admitting: Internal Medicine

## 2021-02-16 ENCOUNTER — Other Ambulatory Visit: Payer: Self-pay

## 2021-02-16 ENCOUNTER — Encounter: Payer: Self-pay | Admitting: Radiation Oncology

## 2021-02-16 ENCOUNTER — Ambulatory Visit
Admission: RE | Admit: 2021-02-16 | Discharge: 2021-02-16 | Disposition: A | Discharge status: Home / Self Care | Payer: Medicare HMO | Source: Ambulatory Visit | Attending: Radiation Oncology | Admitting: Radiation Oncology

## 2021-02-16 VITALS — BP 148/83 | HR 69 | Temp 96.9°F | Resp 16 | Wt 214.9 lb

## 2021-02-16 DIAGNOSIS — Z923 Personal history of irradiation: Secondary | ICD-10-CM | POA: Diagnosis not present

## 2021-02-16 DIAGNOSIS — R351 Nocturia: Secondary | ICD-10-CM | POA: Insufficient documentation

## 2021-02-16 DIAGNOSIS — C61 Malignant neoplasm of prostate: Secondary | ICD-10-CM | POA: Insufficient documentation

## 2021-02-16 NOTE — Progress Notes (Signed)
Radiation Oncology Follow up Note  Name: Rodney Suarez   Date:   02/16/2021 MRN:  [id number] DOB: Mar 14, 1941    This 80 y.o. male presents to the clinic today for 1 month follow-up status post biochemical failure and patient 12 years out from I-125 interstitial implant with PET scan showing hypermetabolic area in the apex of the prostate and obturator node.  REFERRING PROVIDER: Venia Carbon, MD  HPI: Patient is a 80 year old male now at 1 month of salvage radiation therapy IMRT treatment planning and delivery for biochemical failure of prostate cancer 12 years out from I-125 interstitial implant.  Seen today in routine follow-up he is doing fairly well he states he has nocturia x2-3.  He is on no medication for that his bowel function is fine..  COMPLICATIONS OF TREATMENT: none  FOLLOW UP COMPLIANCE: keeps appointments   PHYSICAL EXAM:  BP (!) 148/83 (BP Location: Left Arm, Patient Position: Sitting)   Pulse 69   Temp (!) 96.9 F (36.1 C) (Tympanic)   Resp 16   Wt 214 lb 14.4 oz (97.5 kg)   BMI 30.40 kg/m  Well-developed well-nourished patient in NAD. HEENT reveals PERLA, EOMI, discs not visualized.  Oral cavity is clear. No oral mucosal lesions are identified. Neck is clear without evidence of cervical or supraclavicular adenopathy. Lungs are clear to A&P. Cardiac examination is essentially unremarkable with regular rate and rhythm without murmur rub or thrill. Abdomen is benign with no organomegaly or masses noted. Motor sensory and DTR levels are equal and symmetric in the upper and lower extremities. Cranial nerves II through XII are grossly intact. Proprioception is intact. No peripheral adenopathy or edema is identified. No motor or sensory levels are noted. Crude visual fields are within normal range.  RADIOLOGY RESULTS: No current films for review  PLAN: Present time patient is doing well with low side effect profile from his salvage radiation therapy.  I have asked to see  him back in 3 to 4 months for follow-up with a repeat PSA at that time.  I have also offered to start him on Flomax although he like to hold on that if his symptoms of nocturia worsen we will call that in.  I would like to take this opportunity to thank you for allowing me to participate in the care of your patient.[name], MD

## 2021-02-21 DIAGNOSIS — H401114 Primary open-angle glaucoma, right eye, indeterminate stage: Secondary | ICD-10-CM | POA: Diagnosis not present

## 2021-03-14 ENCOUNTER — Other Ambulatory Visit: Payer: Self-pay | Admitting: Internal Medicine

## 2021-03-15 NOTE — Telephone Encounter (Signed)
Last filled 01-10-21 #60 Last OV 02-04-21 No Future OV [facility] [address]

## 2021-04-01 DIAGNOSIS — H401114 Primary open-angle glaucoma, right eye, indeterminate stage: Secondary | ICD-10-CM | POA: Diagnosis not present

## 2021-04-12 LAB — HM DIABETES EYE EXAM

## 2021-05-02 DIAGNOSIS — H40002 Preglaucoma, unspecified, left eye: Secondary | ICD-10-CM | POA: Diagnosis not present

## 2021-05-03 ENCOUNTER — Other Ambulatory Visit: Payer: Self-pay | Admitting: *Deleted

## 2021-05-03 DIAGNOSIS — C61 Malignant neoplasm of prostate: Secondary | ICD-10-CM

## 2021-05-06 ENCOUNTER — Other Ambulatory Visit: Payer: Self-pay | Admitting: Internal Medicine

## 2021-05-09 NOTE — Telephone Encounter (Signed)
Last filled 03-15-21 #60 Last OV 02-04-21 No Future [facility] [address]

## 2021-05-11 ENCOUNTER — Inpatient Hospital Stay: Payer: Medicare HMO | Attending: Radiation Oncology

## 2021-05-11 ENCOUNTER — Other Ambulatory Visit: Payer: Self-pay

## 2021-05-11 DIAGNOSIS — C61 Malignant neoplasm of prostate: Secondary | ICD-10-CM | POA: Insufficient documentation

## 2021-05-11 LAB — PSA: Prostatic Specific Antigen: 0.02 ng/mL (ref 0.00–4.00)

## 2021-05-17 ENCOUNTER — Ambulatory Visit: Payer: Medicare HMO | Admitting: Urology

## 2021-05-18 ENCOUNTER — Other Ambulatory Visit: Payer: Self-pay | Admitting: *Deleted

## 2021-05-18 ENCOUNTER — Ambulatory Visit
Admission: RE | Admit: 2021-05-18 | Discharge: 2021-05-18 | Disposition: A | Discharge status: Home / Self Care | Payer: Medicare HMO | Source: Ambulatory Visit | Attending: Radiation Oncology | Admitting: Radiation Oncology

## 2021-05-18 ENCOUNTER — Encounter: Payer: Self-pay | Admitting: Radiation Oncology

## 2021-05-18 ENCOUNTER — Other Ambulatory Visit: Payer: Self-pay

## 2021-05-18 DIAGNOSIS — Z923 Personal history of irradiation: Secondary | ICD-10-CM | POA: Diagnosis not present

## 2021-05-18 DIAGNOSIS — C61 Malignant neoplasm of prostate: Secondary | ICD-10-CM | POA: Diagnosis not present

## 2021-05-18 DIAGNOSIS — Z08 Encounter for follow-up examination after completed treatment for malignant neoplasm: Secondary | ICD-10-CM | POA: Diagnosis not present

## 2021-05-18 NOTE — Progress Notes (Signed)
Radiation Oncology Follow up Note  Name: Rodney Suarez   Date:   05/18/2021 MRN:  [id number] DOB: 1940-10-29    This 81 y.o. male presents to the clinic today for 3-month follow-up status post salvage IMRT radiation therapy 12 years out from an I-125 interstitial implant with biochemical failure.Marland Kitchen  REFERRING PROVIDER: Venia Carbon, MD  HPI: Patient is an 81 year old male now out 4 months having completed IMRT radiation therapy in a salvage mode in a patient who experienced biochemical failure 12 years out from an I-125 interstitial implant.  Seen today in routine follow-up he is doing well specifically denies any diarrhea lower urinary tract symptoms exacerbation or fatigue.  His most recent PSA is 0.02.  His PSMA PET did show hypermetabolic to be concerning for local recurrence in the right gland apex  COMPLICATIONS OF TREATMENT: none  FOLLOW UP COMPLIANCE: keeps appointments   PHYSICAL EXAM:  BP (!) (P) 149/76 (BP Location: Left Arm, Patient Position: Sitting)    Pulse (P) 70    Temp (!) (P) 97.1 F (36.2 C) (Tympanic)    Resp (P) 16    Wt (P) 220 lb 4.8 oz (99.9 kg)    BMI (P) 31.16 kg/m  Well-developed well-nourished patient in NAD. HEENT reveals PERLA, EOMI, discs not visualized.  Oral cavity is clear. No oral mucosal lesions are identified. Neck is clear without evidence of cervical or supraclavicular adenopathy. Lungs are clear to A&P. Cardiac examination is essentially unremarkable with regular rate and rhythm without murmur rub or thrill. Abdomen is benign with no organomegaly or masses noted. Motor sensory and DTR levels are equal and symmetric in the upper and lower extremities. Cranial nerves II through XII are grossly intact. Proprioception is intact. No peripheral adenopathy or edema is identified. No motor or sensory levels are noted. Crude visual fields are within normal range.  RADIOLOGY RESULTS: No current films for review  PLAN: Present time he is in under excellent  biochemical control of his prostate cancer.  On pleased with his overall progress he has had a very 6 low side effect profile.  I have asked to see him back in 6 months with a PSA at that time.  He continues close follow-up care with urology.  Patient is to call with any concerns.  I would like to take this opportunity to thank you for allowing me to participate in the care of your patient.[name], MD

## 2021-05-27 ENCOUNTER — Other Ambulatory Visit: Payer: Medicare HMO

## 2021-05-30 NOTE — Progress Notes (Incomplete)
05/30/21 9:09 AM   Rodney Suarez 03/20/1941 [id number]  Referring provider:  Venia Carbon, MD [place],  [place] [address] No chief complaint on file.    HPI: Rodney Suarez is a 81 y.o.male with a personal history of prostate cancer, who presents today for   Status post brachytherapy in the remote past, 2010  PSMA PET scan on 09/08/2020 showed concern for a focal local recurrence within the prostate at the right apex.  There is no associated pelvic or metastatic disease.  There is an incidental left 4 mm intramuscular node of unclear significance.  He started salvage radiation 12/02/2020.  He had Eligard on 12/03/2020 with Zara Council, PA-C.   His most recent PSA was 0.02 on 05/11/2021.   PMH: Past Medical History:  Diagnosis Date   Allergy    BPH (benign prostatic hypertrophy)    Diabetes mellitus    ED (erectile dysfunction)    History of prostate cancer    Hx of adenomatous colonic polyps    Hyperlipidemia    Hypertension    Osteoarthritis     Surgical History: Past Surgical History:  Procedure Laterality Date   APPENDECTOMY  1956   FOOT FRACTURE SURGERY  1996   KNEE ARTHROPLASTY Right 03/06/2016   Procedure: COMPUTER ASSISTED TOTAL KNEE ARTHROPLASTY;  Surgeon: Dereck Leep, MD;  Location: [facility] ORS;  Service: Orthopedics;  Laterality: Right;   KNEE ARTHROSCOPY  11/2005   right knee   PROSTATE SURGERY  01/2009   I-131 implants   SINUS SURGERY WITH INSTATRAK  09/2007    Home Medications:  Allergies as of 05/31/2021       Reactions   Meloxicam Other (See Comments)   Cramps at night   Metformin    REACTION: diarrhea        Medication List        Accurate as of May 30, 2021  9:09 AM. If you have any questions, ask your nurse or doctor.          amLODipine 10 MG tablet Commonly known as: NORVASC TAKE 1 TABLET BY MOUTH EVERY DAY   aspirin EC 81 MG tablet Take 81 mg by mouth daily.   Klor-Con M20 20 MEQ  tablet Generic drug: potassium chloride SA TAKE 1 TABLET BY MOUTH EVERY DAY   lisinopril-hydrochlorothiazide 20-12.5 MG tablet Commonly known as: ZESTORETIC TAKE 2 TABLETS BY MOUTH EVERY DAY   LORazepam 1 MG tablet Commonly known as: ATIVAN TAKE 1 TO 2 TABLETS BY MOUTH AT BEDTIME AS NEEDED FOR SLEEP   pravastatin 20 MG tablet Commonly known as: PRAVACHOL TAKE 1 TABLET BY MOUTH EVERY DAY   timolol 0.5 % ophthalmic solution Commonly known as: TIMOPTIC Place 1 drop into the right eye 2 (two) times daily.        Allergies:  Allergies  Allergen Reactions   Meloxicam Other (See Comments)    Cramps at night   Metformin     REACTION: diarrhea    Family History: No family history on file.  Social History:  reports that he quit smoking about 39 years ago. His smoking use included cigarettes. He has never used smokeless tobacco. He reports that he does not drink alcohol and does not use drugs.   Physical Exam: There were no vitals taken for this visit.  Constitutional:  Alert and oriented, No acute distress. HEENT: [place] AT, moist mucus membranes.  Trachea midline, no masses. Cardiovascular: No clubbing, cyanosis, or edema. Respiratory: Normal respiratory  effort, no increased work of breathing. Skin: No rashes, bruises or suspicious lesions. Neurologic: Grossly intact, no focal deficits, moving all 4 extremities. Psychiatric: Normal mood and affect.  Laboratory Data: Lab Results  Component Value Date   CREATININE 1.04 02/04/2021   Lab Results  Component Value Date   PSA 0.67 04/01/2012   Lab Results  Component Value Date   HGBA1C 7.3 (H) 02/04/2021    Urinalysis   Pertinent Imaging:    Assessment & Plan:     No follow-ups on file.  I,Kailey Littlejohn,acting as a Education administrator for Hollice Espy, MD.,have documented all relevant documentation on the behalf of Hollice Espy, MD,as directed by  Hollice Espy, MD while in the presence of Hollice Espy,  [facility] [address], [place], [place] [address] [phone]

## 2021-05-31 ENCOUNTER — Encounter: Payer: Self-pay | Admitting: Urology

## 2021-05-31 ENCOUNTER — Ambulatory Visit: Payer: Medicare HMO | Admitting: Urology

## 2021-06-17 ENCOUNTER — Telehealth: Payer: Self-pay

## 2021-06-17 NOTE — Telephone Encounter (Signed)
-----   Message from Shanon Ace, [place] sent at 06/17/2021  2:44 PM EDT ----- ?Regarding: Eligard ?Hey! ? ?Looks like his PA expired, he is coming in 06/22/21 for Eligard.  ? ?Thank you ?[place] ? ? ?

## 2021-06-17 NOTE — Telephone Encounter (Signed)
PA form faxed to [employer] to renew PA, awaiting response.  ?

## 2021-06-20 ENCOUNTER — Telehealth: Payer: Self-pay

## 2021-06-20 NOTE — Telephone Encounter (Signed)
Patient called wanted to know if there was a generic that he can be changed to replace the Klor Con do to the cost. If available [facility] [address]  ?

## 2021-06-20 NOTE — Telephone Encounter (Signed)
Spoke to [place] at [place]. She stated Klor-Con is the generic and that it is covered at [employer]. I called the pt and advised him. ?

## 2021-06-20 NOTE — Telephone Encounter (Signed)
Pharmacy is closed for lunch. Will have to call back after 2pm. ?

## 2021-06-20 NOTE — Telephone Encounter (Signed)
Incoming PA for [employer], approved.  ? ?Dates: 06/17/21 - 12/18/21.  ? ? ?

## 2021-06-21 NOTE — Progress Notes (Signed)
? ?06/22/21 ?3:26 PM  ? ?Rodney Suarez ?1940-10-09 ?[id number] ? ?Referring provider:  ?Venia Carbon, MD ?[address] ?[place],  [place] [address] ?Chief Complaint  ?Patient presents with  ? Prostate Cancer  ? ? ? ?HPI: ?Rodney Suarez is a 81 y.o.male with a personal history of prostate cancer and elevated PSA who presents today for a 6 month follow-up with Eligard.  ? ?He is status post brachytherapy in the remote past, 2010.   ? ?In the interim, he is undergone PSMA PET scan on 09/08/2020.  This shows concern for a focal local recurrence within the prostate at the right apex. There is no associated pelvic or metastatic disease.  There is an incidental left 4 mm intramuscular node of unclear significance. ? ?He underwent a MRI of pelvis on 11/13/2020 that visualized a 0.5 cm in short axis left obturator lymph node appears to correspond most closely to the region of focal hypermetabolic activity along the left obturator fossa seen on recent [facility] PET ?CT. This raises the concern of nodal involvement despite the small size of the lymph node ? ?He started salvage radiation 12/02/2020. ? ?His most recent Eligard was on 12/03/2020.  ? ?His most recent PSA was 0.02 on 05/11/2021.  ? ?He reports that radiation went well. No urinary issues today.    ? ?PMH: ?Past Medical History:  ?Diagnosis Date  ? Allergy   ? BPH (benign prostatic hypertrophy)   ? Diabetes mellitus   ? ED (erectile dysfunction)   ? History of prostate cancer   ? Hx of adenomatous colonic polyps   ? Hyperlipidemia   ? Hypertension   ? Osteoarthritis   ? ? ?Surgical History: ?Past Surgical History:  ?Procedure Laterality Date  ? APPENDECTOMY  1956  ? [place]  ? KNEE ARTHROPLASTY Right 03/06/2016  ? Procedure: COMPUTER ASSISTED TOTAL KNEE ARTHROPLASTY;  Surgeon: Dereck Leep, MD;  Location: [facility] ORS;  Service: Orthopedics;  Laterality: Right;  ? KNEE ARTHROSCOPY  11/2005  ? right knee  ? PROSTATE SURGERY  01/2009  ? I-131  implants  ? SINUS SURGERY WITH INSTATRAK  09/2007  ? ? ?Home Medications:  ?Allergies as of 06/22/2021   ? ?   Reactions  ? Meloxicam Other (See Comments)  ? Cramps at night  ? Metformin   ? REACTION: diarrhea  ? ?  ? ?  ?Medication List  ?  ? ?  ? Accurate as of June 22, 2021 11:59 PM. If you have any questions, ask your nurse or doctor.  ?  ?  ? ?  ? ?amLODipine 10 MG tablet ?Commonly known as: NORVASC ?TAKE 1 TABLET BY MOUTH EVERY DAY ?  ?aspirin EC 81 MG tablet ?Take 81 mg by mouth daily. ?  ?Klor-Con M20 20 MEQ tablet ?Generic drug: potassium chloride SA ?TAKE 1 TABLET BY MOUTH EVERY DAY ?  ?lisinopril-hydrochlorothiazide 20-12.5 MG tablet ?Commonly known as: ZESTORETIC ?TAKE 2 TABLETS BY MOUTH EVERY DAY ?  ?LORazepam 1 MG tablet ?Commonly known as: ATIVAN ?TAKE 1 TO 2 TABLETS BY MOUTH AT BEDTIME AS NEEDED FOR SLEEP ?  ?pravastatin 20 MG tablet ?Commonly known as: PRAVACHOL ?TAKE 1 TABLET BY MOUTH EVERY DAY ?  ?timolol 0.5 % ophthalmic solution ?Commonly known as: TIMOPTIC ?Place 1 drop into the right eye 2 (two) times daily. ?  ? ?  ? ? ?Allergies:  ?Allergies  ?Allergen Reactions  ? Meloxicam Other (See Comments)  ?  Cramps at night  ?  Metformin   ?  REACTION: diarrhea  ? ? ?Family History: ?No family history on file. ? ?Social History:  reports that he quit smoking about 39 years ago. His smoking use included cigarettes. He has never used smokeless tobacco. He reports that he does not drink alcohol and does not use drugs. ? ? ?Physical Exam: ?BP (!) 155/81   Pulse 76   Ht 5' 10.5" (1.791 m)   Wt 214 lb (97.1 kg)   BMI 30.27 kg/m?   ?Constitutional:  Alert and oriented, No acute distress. ?HEENT: [place] AT, moist mucus membranes.  Trachea midline, no masses. ?Cardiovascular: No clubbing, cyanosis, or edema. ?Respiratory: Normal respiratory effort, no increased work of breathing. ?Skin: No rashes, bruises or suspicious lesions. ?Neurologic: Grossly intact, no focal deficits, moving all 4  extremities. ?Psychiatric: Normal mood and affect. ? ?Laboratory Data: ?Lab Results  ?Component Value Date  ? CREATININE 1.04 02/04/2021  ? ?Lab Results  ?Component Value Date  ? PSA 0.67 04/01/2012  ? ?Lab Results  ?Component Value Date  ? HGBA1C 7.3 (H) 02/04/2021  ? ?Assessment & Plan:   ? ?1. Prostate cancer ?- PSA  0.02 on 05/11/2021 s/p salvage radiation and ADT ?- Discussed with him that this will be his last Eligard injection and then we will monitor what his PSA does after hormone injection. Discussed the risk of hormone therapy including bone health.  ?- Leuprolide (6 months) ( Eligard) injection 45 mg; given today  ? ?Return in about 6 months (around 12/23/2021). PSA ? ?Conley Rolls as a Education administrator for Hollice Espy, MD.,have documented all relevant documentation on the behalf of Hollice Espy, MD,as directed by  Hollice Espy, MD while in the presence of Hollice Espy, MD. ? ?I have reviewed the above documentation for accuracy and completeness, and I agree with the above.  ? ?Hollice Espy, MD ? ?[facility] ?[address], [address] ?[place], [place] [address] ?[phone] ? ?

## 2021-06-22 ENCOUNTER — Other Ambulatory Visit: Payer: Self-pay

## 2021-06-22 ENCOUNTER — Encounter: Payer: Self-pay | Admitting: Urology

## 2021-06-22 ENCOUNTER — Ambulatory Visit (INDEPENDENT_AMBULATORY_CARE_PROVIDER_SITE_OTHER): Payer: Medicare HMO | Admitting: Urology

## 2021-06-22 VITALS — BP 155/81 | HR 76 | Ht 70.5 in | Wt 214.0 lb

## 2021-06-22 DIAGNOSIS — C61 Malignant neoplasm of prostate: Secondary | ICD-10-CM | POA: Diagnosis not present

## 2021-06-22 MED ORDER — LEUPROLIDE ACETATE (6 MONTH) 45 MG ~~LOC~~ KIT
45.0000 mg | PACK | Freq: Once | SUBCUTANEOUS | Status: AC
  Administered 2021-06-22: 45 mg via SUBCUTANEOUS

## 2021-06-22 NOTE — Progress Notes (Signed)
Eligard SubQ Injection  ? ?Due to Prostate Cancer patient is present today for a Eligard Injection. ? ?Medication: Eligard 6 month ?Dose: 45 mg  ?Location: right lower abdomen ?Lot: [id number] ?Exp: 12/22/22 ? ?Patient tolerated well, no complications were noted ? ?Performed by: Verlene Mayer, CMA  ? ?Per Dr. Erlene Quan patient is to this was last Eligard injection. Reminder continue on Vitamin D 800-1000iu and Calcium 1000-'1200mg'$  daily while on Androgen Deprivation Therapy.  ? ? ?

## 2021-07-18 ENCOUNTER — Encounter: Payer: Self-pay | Admitting: Internal Medicine

## 2021-07-18 ENCOUNTER — Ambulatory Visit (INDEPENDENT_AMBULATORY_CARE_PROVIDER_SITE_OTHER): Payer: Medicare HMO | Admitting: Internal Medicine

## 2021-07-18 ENCOUNTER — Telehealth: Payer: Self-pay

## 2021-07-18 DIAGNOSIS — L089 Local infection of the skin and subcutaneous tissue, unspecified: Secondary | ICD-10-CM

## 2021-07-18 DIAGNOSIS — L723 Sebaceous cyst: Secondary | ICD-10-CM

## 2021-07-18 MED ORDER — CEPHALEXIN 500 MG PO CAPS: 500.0000 mg | ORAL_CAPSULE | Freq: Three times a day (TID) | ORAL | 0 refills | Status: DC

## 2021-07-18 NOTE — Telephone Encounter (Signed)
Sounds like a cyst--but I will assess at the visit today ?

## 2021-07-18 NOTE — Telephone Encounter (Signed)
[facility] ?Nonclinical Telephone Record  ?[facility]? ?Client [facility] ?Client Site [place] ?Provider Viviana Simpler- MD ?Contact Type Call ?Who Is Calling Patient / Member / Family / Caregiver ?Caller Name Kekoa Fyock ?Caller Phone Number [phone] ?Patient Name Rodney Suarez ?Patient DOB 17-Sep-1940 ?Call Type Message Only Information Provided ?Reason for Call Request to Schedule Office Appointment ?Initial Comment Caller states he has a knot on the back of his head. ?Patient request to speak to RN No ?Disp. Time Disposition Final User ?07/17/2021 9:44:40 AM General Information Provided Yes Paulita Cradle ?Call Closed By: Paulita Cradle ?Transaction Date/Time: 07/17/2021 9:42:11 AM (ET ?

## 2021-07-18 NOTE — Telephone Encounter (Signed)
I spoke with pt and he said had a lump on back of neck on lt side for years with no problem and on 07/17/21 pt said noticed lump had grown and was hurting; now pain level 4 -5. Today about the size of golf ball; no redness or drainage noted. Pt request appt; pt scheduled appt with Dr Silvio Pate 07/18/21 at 2pm.UC & ED precautions given and pt voiced understanding. Sending note to Dr Silvio Pate.  ?

## 2021-07-18 NOTE — Assessment & Plan Note (Signed)
Mild infection in preexisting cyst ?Discussed hot compresses and expressing the pus further ?Will give 5 days of cephalexin 500 tid  ?

## 2021-07-18 NOTE — Progress Notes (Signed)
? ?Subjective:  ? ? Patient ID: Rodney Suarez, male    DOB: 1940/10/07, 81 y.o.   MRN: [id number] ? ?HPI ?Here due to a painful knot on his neck ? ?The knot has been there for years---suddenly noted pain in past couple of days ?Never had any problems before ? ?No trauma ?No fever ? ?Tried an ice pack--quieted down some ? ?Current Outpatient Medications on File Prior to Visit  ?Medication Sig Dispense Refill  ? amLODipine (NORVASC) 10 MG tablet TAKE 1 TABLET BY MOUTH EVERY DAY 90 tablet 3  ? aspirin EC 81 MG tablet Take 81 mg by mouth daily.    ? KLOR-CON M20 20 MEQ tablet TAKE 1 TABLET BY MOUTH EVERY DAY 90 tablet 3  ? lisinopril-hydrochlorothiazide (ZESTORETIC) 20-12.5 MG tablet TAKE 2 TABLETS BY MOUTH EVERY DAY 180 tablet 3  ? LORazepam (ATIVAN) 1 MG tablet TAKE 1 TO 2 TABLETS BY MOUTH AT BEDTIME AS NEEDED FOR SLEEP 60 tablet 0  ? pravastatin (PRAVACHOL) 20 MG tablet TAKE 1 TABLET BY MOUTH EVERY DAY 90 tablet 3  ? timolol (TIMOPTIC) 0.5 % ophthalmic solution Place 1 drop into the right eye 2 (two) times daily.    ? ?No current facility-administered medications on file prior to visit.  ? ? ?Allergies  ?Allergen Reactions  ? Meloxicam Other (See Comments)  ?  Cramps at night  ? Metformin   ?  REACTION: diarrhea  ? ? ?Past Medical History:  ?Diagnosis Date  ? Allergy   ? BPH (benign prostatic hypertrophy)   ? Diabetes mellitus   ? ED (erectile dysfunction)   ? History of prostate cancer   ? Hx of adenomatous colonic polyps   ? Hyperlipidemia   ? Hypertension   ? Osteoarthritis   ? ? ?Past Surgical History:  ?Procedure Laterality Date  ? APPENDECTOMY  1956  ? [place]  ? KNEE ARTHROPLASTY Right 03/06/2016  ? Procedure: COMPUTER ASSISTED TOTAL KNEE ARTHROPLASTY;  Surgeon: Dereck Leep, MD;  Location: [facility] ORS;  Service: Orthopedics;  Laterality: Right;  ? KNEE ARTHROSCOPY  11/2005  ? right knee  ? PROSTATE SURGERY  01/2009  ? I-131 implants  ? SINUS SURGERY WITH INSTATRAK  09/2007  ? ? ?History  reviewed. No pertinent family history. ? ?Social History  ? ?Socioeconomic History  ? Marital status: Married  ?  Spouse name: Not on file  ? Number of children: 2  ? Years of education: Not on file  ? Highest education level: Not on file  ?Occupational History  ? Occupation: retired  ?  Employer: RETIRED  ?  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 Smoking status: Former  ?  Types: Cigarettes  ?  Quit date: 04/03/1982  ?  Years since quitting: 39.3  ? Smokeless tobacco: Never  ?Substance and Sexual Activity  ? Alcohol use: No  ? Drug use: No  ? Sexual activity: Not on file  ?Other Topics Concern  ? Not on file  ?Social History Narrative  ? No living will  ? Wife and daughter should be health care POAs  ? Discussed DNR--he requests this (done 10/14/12)  ? No tube feedings if cognitively unaware  ? ?Social Determinants of Health  ? ?Financial Resource Strain: Not on file  ?Food Insecurity: Not on file  ?Transportation Needs: Not on file  ?Physical Activity: Not on file  ?Stress: Not on file  ?Social Connections: Not on file  ?Intimate Partner Violence: Not on file  ? ?  Review of Systems ?Doesn't feel sick ? ? ?   ?Objective:  ? Physical Exam ?Constitutional:   ?   Appearance: Normal appearance.  ?Skin: ?   Comments: ~3.5cm cyst on back of neck ?Mildly warm but not overly tender ?1-2 cc pus extruded from tiny hole in center  ?Neurological:  ?   Mental Status: He is alert.  ?  ? ? ? ? ?   ?Assessment & Plan:  ? ?

## 2021-07-18 NOTE — Patient Instructions (Signed)
Please try warm/hot compresses on the cyst and then see if someone can push the pus out (there is a tiny hole on the top) ?

## 2021-07-26 ENCOUNTER — Encounter: Payer: Self-pay | Admitting: Internal Medicine

## 2021-07-26 ENCOUNTER — Ambulatory Visit (INDEPENDENT_AMBULATORY_CARE_PROVIDER_SITE_OTHER): Payer: Medicare HMO | Admitting: Internal Medicine

## 2021-07-26 ENCOUNTER — Other Ambulatory Visit: Payer: Self-pay | Admitting: Internal Medicine

## 2021-07-26 DIAGNOSIS — L0211 Cutaneous abscess of neck: Secondary | ICD-10-CM | POA: Insufficient documentation

## 2021-07-26 DIAGNOSIS — L723 Sebaceous cyst: Secondary | ICD-10-CM | POA: Diagnosis not present

## 2021-07-26 DIAGNOSIS — L089 Local infection of the skin and subcutaneous tissue, unspecified: Secondary | ICD-10-CM

## 2021-07-26 MED ORDER — DOXYCYCLINE HYCLATE 100 MG PO TABS: 100.0000 mg | ORAL_TABLET | Freq: Two times a day (BID) | ORAL | 0 refills | Status: DC

## 2021-07-26 NOTE — Assessment & Plan Note (Signed)
Fluctuant and tender cyst ?Discussed alternatives ?Verbal consent ? ?Sterile prep ?Ethyl chloride/3cc 2%lido with epi ?2 cm incision ---- at least 30-40cc pus/cyst contents extruded. ?Complex cyst---septations removed with sterile forceps ?Finally just some blood ?Packed with sterile packing ?Dry sterile dressing ?Will see back in 2-3 days ?

## 2021-07-26 NOTE — Assessment & Plan Note (Signed)
Didn't respond to cephalexin ?Much bigger ?Will change to doxy 100 bid ?

## 2021-07-26 NOTE — Progress Notes (Signed)
? ?Subjective:  ? ? Patient ID: Rodney Suarez, male    DOB: 13-Feb-1941, 81 y.o.   MRN: [id number] ? ?HPI ?Here due to ongoing problems with the cyst ? ?Having pain in the cyst---really severe at times ?Quiets at times ?Has had some draining still ? ?Current Outpatient Medications on File Prior to Visit  ?Medication Sig Dispense Refill  ? amLODipine (NORVASC) 10 MG tablet TAKE 1 TABLET BY MOUTH EVERY DAY 90 tablet 3  ? aspirin EC 81 MG tablet Take 81 mg by mouth daily.    ? KLOR-CON M20 20 MEQ tablet TAKE 1 TABLET BY MOUTH EVERY DAY 90 tablet 3  ? lisinopril-hydrochlorothiazide (ZESTORETIC) 20-12.5 MG tablet TAKE 2 TABLETS BY MOUTH EVERY DAY 180 tablet 3  ? LORazepam (ATIVAN) 1 MG tablet TAKE 1 TO 2 TABLETS BY MOUTH AT BEDTIME AS NEEDED FOR SLEEP 60 tablet 0  ? pravastatin (PRAVACHOL) 20 MG tablet TAKE 1 TABLET BY MOUTH EVERY DAY 90 tablet 3  ? timolol (TIMOPTIC) 0.5 % ophthalmic solution Place 1 drop into the right eye 2 (two) times daily.    ? ?No current facility-administered medications on file prior to visit.  ? ? ?Allergies  ?Allergen Reactions  ? Meloxicam Other (See Comments)  ?  Cramps at night  ? Metformin   ?  REACTION: diarrhea  ? ? ?Past Medical History:  ?Diagnosis Date  ? Allergy   ? BPH (benign prostatic hypertrophy)   ? Diabetes mellitus   ? ED (erectile dysfunction)   ? History of prostate cancer   ? Hx of adenomatous colonic polyps   ? Hyperlipidemia   ? Hypertension   ? Osteoarthritis   ? ? ?Past Surgical History:  ?Procedure Laterality Date  ? APPENDECTOMY  1956  ? [place]  ? KNEE ARTHROPLASTY Right 03/06/2016  ? Procedure: COMPUTER ASSISTED TOTAL KNEE ARTHROPLASTY;  Surgeon: Dereck Leep, MD;  Location: [facility] ORS;  Service: Orthopedics;  Laterality: Right;  ? KNEE ARTHROSCOPY  11/2005  ? right knee  ? PROSTATE SURGERY  01/2009  ? I-131 implants  ? SINUS SURGERY WITH INSTATRAK  09/2007  ? ? ?History reviewed. No pertinent family history. ? ?Social History  ? ?Socioeconomic  History  ? Marital status: Married  ?  Spouse name: Not on file  ? Number of children: 2  ? Years of education: Not on file  ? Highest education level: Not on file  ?Occupational History  ? Occupation: retired  ?  Employer: RETIRED  ?  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 Smoking status: Former  ?  Types: Cigarettes  ?  Quit date: 04/03/1982  ?  Years since quitting: 39.3  ?  Passive exposure: Past  ? Smokeless tobacco: Never  ?Substance and Sexual Activity  ? Alcohol use: No  ? Drug use: No  ? Sexual activity: Not on file  ?Other Topics Concern  ? Not on file  ?Social History Narrative  ? No living will  ? Wife and daughter should be health care POAs  ? Discussed DNR--he requests this (done 10/14/12)  ? No tube feedings if cognitively unaware  ? ?Social Determinants of Health  ? ?Financial Resource Strain: Not on file  ?Food Insecurity: Not on file  ?Transportation Needs: Not on file  ?Physical Activity: Not on file  ?Stress: Not on file  ?Social Connections: Not on file  ?Intimate Partner Violence: Not on file  ? ?Review of Systems ?No fever ?No trauma ? ?   ?  Objective:  ? Physical Exam ?Constitutional:   ?   Appearance: Normal appearance.  ?Skin: ?   Comments: Cyst now larger ?Warm and tender ?Fluctuant area at top ?3 small holes in center--but no drainage ?Total ~6-7cm across or more. Raised 2-3cm  ?Neurological:  ?   Mental Status: He is alert.  ?  ? ? ? ? ?   ?Assessment & Plan:  ? ?

## 2021-07-26 NOTE — Patient Instructions (Signed)
Please replace the dressing with dry gauze tomorrow (or today if blood seeps through). Leave the packing inside for now. ?

## 2021-07-27 NOTE — Telephone Encounter (Signed)
Last Filled 05-09-21 #60 ?Last OV 07-26-21 ?Next OV 07-29-21 ?[facility] [address] ?

## 2021-07-29 ENCOUNTER — Ambulatory Visit (INDEPENDENT_AMBULATORY_CARE_PROVIDER_SITE_OTHER): Payer: Medicare HMO | Admitting: Internal Medicine

## 2021-07-29 ENCOUNTER — Ambulatory Visit: Payer: Medicare HMO | Admitting: Internal Medicine

## 2021-07-29 ENCOUNTER — Encounter: Payer: Self-pay | Admitting: Internal Medicine

## 2021-07-29 VITALS — BP 110/74 | HR 77 | Temp 97.5°F | Ht 73.0 in | Wt 221.0 lb

## 2021-07-29 DIAGNOSIS — L723 Sebaceous cyst: Secondary | ICD-10-CM | POA: Diagnosis not present

## 2021-07-29 DIAGNOSIS — L089 Local infection of the skin and subcutaneous tissue, unspecified: Secondary | ICD-10-CM | POA: Diagnosis not present

## 2021-07-29 NOTE — Assessment & Plan Note (Signed)
Much better ?Almost empty now--and much smaller ?Still some pus---new dressing applied ?Will finish out the 7 days of doxy ? ?Will refer to surgeon for excision since it got so bad ?

## 2021-07-29 NOTE — Progress Notes (Signed)
? ?Subjective:  ? ? Patient ID: Rodney Suarez, male    DOB: 1940/05/09, 81 y.o.   MRN: [id number] ? ?HPI ?Here for follow up of abscess and infection of cyst on neck ? ?Still draining--mostly yellowish stuff ?Pain persists---but not nearly as bad ?No problems with the antibiotic ? ?Current Outpatient Medications on File Prior to Visit  ?Medication Sig Dispense Refill  ? amLODipine (NORVASC) 10 MG tablet TAKE 1 TABLET BY MOUTH EVERY DAY 90 tablet 3  ? aspirin EC 81 MG tablet Take 81 mg by mouth daily.    ? doxycycline (VIBRA-TABS) 100 MG tablet Take 1 tablet (100 mg total) by mouth 2 (two) times daily. 14 tablet 0  ? KLOR-CON M20 20 MEQ tablet TAKE 1 TABLET BY MOUTH EVERY DAY 90 tablet 3  ? lisinopril-hydrochlorothiazide (ZESTORETIC) 20-12.5 MG tablet TAKE 2 TABLETS BY MOUTH EVERY DAY 180 tablet 3  ? LORazepam (ATIVAN) 1 MG tablet TAKE 1 TO 2 TABLETS BY MOUTH AT BEDTIME AS NEEDED FOR SLEEP 60 tablet 0  ? pravastatin (PRAVACHOL) 20 MG tablet TAKE 1 TABLET BY MOUTH EVERY DAY 90 tablet 3  ? timolol (TIMOPTIC) 0.5 % ophthalmic solution Place 1 drop into the right eye 2 (two) times daily.    ? ?No current facility-administered medications on file prior to visit.  ? ? ?Allergies  ?Allergen Reactions  ? Meloxicam Other (See Comments)  ?  Cramps at night  ? Metformin   ?  REACTION: diarrhea  ? ? ?Past Medical History:  ?Diagnosis Date  ? Allergy   ? BPH (benign prostatic hypertrophy)   ? Diabetes mellitus   ? ED (erectile dysfunction)   ? History of prostate cancer   ? Hx of adenomatous colonic polyps   ? Hyperlipidemia   ? Hypertension   ? Osteoarthritis   ? ? ?Past Surgical History:  ?Procedure Laterality Date  ? APPENDECTOMY  1956  ? [place]  ? KNEE ARTHROPLASTY Right 03/06/2016  ? Procedure: COMPUTER ASSISTED TOTAL KNEE ARTHROPLASTY;  Surgeon: Dereck Leep, MD;  Location: [facility] ORS;  Service: Orthopedics;  Laterality: Right;  ? KNEE ARTHROSCOPY  11/2005  ? right knee  ? PROSTATE SURGERY  01/2009  ?  I-131 implants  ? SINUS SURGERY WITH INSTATRAK  09/2007  ? ? ?History reviewed. No pertinent family history. ? ?Social History  ? ?Socioeconomic History  ? Marital status: Married  ?  Spouse name: Not on file  ? Number of children: 2  ? Years of education: Not on file  ? Highest education level: Not on file  ?Occupational History  ? Occupation: retired  ?  Employer: RETIRED  ?  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 Smoking status: Former  ?  Types: Cigarettes  ?  Quit date: 04/03/1982  ?  Years since quitting: 39.3  ?  Passive exposure: Past  ? Smokeless tobacco: Never  ?Substance and Sexual Activity  ? Alcohol use: No  ? Drug use: No  ? Sexual activity: Not on file  ?Other Topics Concern  ? Not on file  ?Social History Narrative  ? No living will  ? Wife and daughter should be health care POAs  ? Discussed DNR--he requests this (done 10/14/12)  ? No tube feedings if cognitively unaware  ? ?Social Determinants of Health  ? ?Financial Resource Strain: Not on file  ?Food Insecurity: Not on file  ?Transportation Needs: Not on file  ?Physical Activity: Not on file  ?Stress: Not  on file  ?Social Connections: Not on file  ?Intimate Partner Violence: Not on file  ? ?Review of Systems ?No fever ?Eating fine ? ?   ?Objective:  ? Physical Exam ?Constitutional:   ?   Appearance: Normal appearance.  ?Skin: ?   Comments: Cyst is much smaller ?Not warm or tender ?Packing is already out (must have come out with the drainage) ?I still got out ~10-15cc pus and cyst contents ?  ?Neurological:  ?   Mental Status: He is alert.  ?  ? ? ? ? ?   ?Assessment & Plan:  ? ?

## 2021-08-09 ENCOUNTER — Ambulatory Visit (INDEPENDENT_AMBULATORY_CARE_PROVIDER_SITE_OTHER): Payer: Medicare HMO | Admitting: Surgery

## 2021-08-09 ENCOUNTER — Encounter: Payer: Self-pay | Admitting: Surgery

## 2021-08-09 VITALS — BP 161/88 | HR 91 | Temp 98.5°F | Ht 73.0 in | Wt 218.8 lb

## 2021-08-09 DIAGNOSIS — L723 Sebaceous cyst: Secondary | ICD-10-CM

## 2021-08-09 DIAGNOSIS — L089 Local infection of the skin and subcutaneous tissue, unspecified: Secondary | ICD-10-CM

## 2021-08-09 NOTE — Patient Instructions (Signed)
If you have any concerns or questions, please feel free to call our office. See follow up appointment below.  Epidermoid Cyst Removal, Care After  This sheet gives you information about how to care for yourself after your procedure. Your health care provider may also give you more specific instructions. If you have problems or questions, contact your health care provider. What can I expect after the procedure? After the procedure, it is common to have: Soreness in the area where your cyst was removed. Tightness or itchiness from the stitches (sutures) in your skin. Follow these instructions at home: Medicines Take over-the-counter and prescription medicines only as told by your health care provider. If you were prescribed an antibiotic medicine or ointment, take or apply it as told by your health care provider. Do not stop using the antibiotic even if you start to feel better. Incision care  Follow instructions from your health care provider about how to take care of your incision. Make sure you: Wash your hands with soap and water for at least 20 seconds before you change your bandage (dressing). If soap and water are not available, use hand sanitizer. Change your dressing as told by your health care provider. Leave sutures, skin glue, or adhesive strips in place. These skin closures may need to stay in place for 1-2 weeks or longer. If adhesive strip edges start to loosen and curl up, you may trim the loose edges. Do not remove adhesive strips completely unless your health care provider tells you to do that. Keep the dressing dry until your health care provider says that it can be removed. After your dressing is off, check your incision area every day for signs of infection. Check for: Redness, swelling, or pain. Fluid or blood. Warmth. Pus or a bad smell. General instructions Do not take baths, swim, or use a hot tub until your health care provider approves. Ask your health care provider  if you may take showers. You may only be allowed to take sponge baths. Your health care provider may ask you to avoid contact sports or activities that take a lot of effort. Do not do anything that stretches or puts pressure on your incision. You can return to your normal diet. Keep all follow-up visits. This is important. Contact a health care provider if: You have a fever. You have redness, swelling, or pain in the incision area. You have fluid or blood coming from your incision. You have pus or a bad smell coming from your incision. Your incision feels warm to the touch. Your cyst grows back. Get help right away if: If the incision site suddenly increases in size and you have pain at the incision site. You may be checked for a collection of blood under the skin from the procedure (hematoma). Summary After the procedure, it is common to have soreness in the area where your cyst was removed. Take or apply over-the-counter and prescription medicines only as told by your health care provider. Follow instructions from your health care provider about how to take care of your incision. This information is not intended to replace advice given to you by your health care provider. Make sure you discuss any questions you have with your health care provider. Document Revised: 06/25/2019 Document Reviewed: 06/25/2019 Elsevier Patient Education  2023 [employer]   

## 2021-08-09 NOTE — Progress Notes (Signed)
Patient ID: Rodney Suarez, male   DOB: 09/25/1940, 81 y.o.   MRN: [id number] ? ?Chief Complaint: Infected cyst of posterior neck. ? ?History of Present Illness ?Rodney Suarez is a 81 y.o. male with with reported golf ball size mass on his posterior neck/lower scalp.  Painful and tender, subsequent large amount of drainage on his pillow 1 night.  Prescribed doxycycline and completed course.  Still having some drainage. ? ?Past Medical History ?Past Medical History:  ?Diagnosis Date  ? Allergy   ? BPH (benign prostatic hypertrophy)   ? Diabetes mellitus   ? ED (erectile dysfunction)   ? History of prostate cancer   ? Hx of adenomatous colonic polyps   ? Hyperlipidemia   ? Hypertension   ? Osteoarthritis   ?  ? ? ?Past Surgical History:  ?Procedure Laterality Date  ? APPENDECTOMY  1956  ? Maish Vaya  ? KNEE ARTHROPLASTY Right 03/06/2016  ? Procedure: COMPUTER ASSISTED TOTAL KNEE ARTHROPLASTY;  Surgeon: Dereck Leep, MD;  Location: [facility] ORS;  Service: Orthopedics;  Laterality: Right;  ? KNEE ARTHROSCOPY  11/2005  ? right knee  ? PROSTATE SURGERY  01/2009  ? I-131 implants  ? SINUS SURGERY WITH INSTATRAK  09/2007  ? ? ?Allergies  ?Allergen Reactions  ? Meloxicam Other (See Comments)  ?  Cramps at night  ? Metformin   ?  REACTION: diarrhea  ? ? ?Current Outpatient Medications  ?Medication Sig Dispense Refill  ? amLODipine (NORVASC) 10 MG tablet TAKE 1 TABLET BY MOUTH EVERY DAY 90 tablet 3  ? aspirin EC 81 MG tablet Take 81 mg by mouth daily.    ? doxycycline (VIBRA-TABS) 100 MG tablet Take 1 tablet (100 mg total) by mouth 2 (two) times daily. 14 tablet 0  ? KLOR-CON M20 20 MEQ tablet TAKE 1 TABLET BY MOUTH EVERY DAY 90 tablet 3  ? lisinopril-hydrochlorothiazide (ZESTORETIC) 20-12.5 MG tablet TAKE 2 TABLETS BY MOUTH EVERY DAY 180 tablet 3  ? LORazepam (ATIVAN) 1 MG tablet TAKE 1 TO 2 TABLETS BY MOUTH AT BEDTIME AS NEEDED FOR SLEEP 60 tablet 0  ? pravastatin (PRAVACHOL) 20 MG tablet TAKE 1 TABLET BY MOUTH  EVERY DAY 90 tablet 3  ? timolol (TIMOPTIC) 0.5 % ophthalmic solution Place 1 drop into the right eye 2 (two) times daily.    ? ?No current facility-administered medications for this visit.  ? ? ?Family History ?History reviewed. No pertinent family history.  ? ? ?Social History ?Social History  ? ?Tobacco Use  ? Smoking status: Former  ?  Types: Cigarettes  ?  Quit date: 04/03/1982  ?  Years since quitting: 39.3  ?  Passive exposure: Past  ? Smokeless tobacco: Never  ?Substance Use Topics  ? Alcohol use: No  ? Drug use: No  ?  ?  ? ? ?Review of Systems  ?Constitutional: Negative.   ?HENT: Negative.    ?Eyes: Negative.   ?Respiratory: Negative.    ?Cardiovascular: Negative.   ?Gastrointestinal: Negative.   ?Genitourinary: Negative.   ?Skin: Negative.   ?Neurological: Negative.   ?Psychiatric/Behavioral: Negative.    ?  ? ?Physical Exam ?Blood pressure (!) 161/88, pulse 91, temperature 98.5 ?F (36.9 ?C), temperature source Oral, height '6\' 1"'$  (1.854 m), weight 218 lb 12.8 oz (99.2 kg), SpO2 100 %. ?Last Weight  Most recent update: 08/09/2021  9:24 AM  ? ? Weight  ?99.2 kg (218 lb 12.8 oz)  ?      ? ?  ? ? ?  CONSTITUTIONAL: Well developed, and nourished, appropriately responsive and aware without distress.   ?EYES: Sclera non-icteric.   ?EARS, NOSE, MOUTH AND THROAT:  The oropharynx is clear. Oral mucosa is pink and moist.   Hearing is intact to voice.  ?NECK: Trachea is midline, and there is no jugular venous distension.  ?Posterior neck/inferior occipital scalp there is 2 draining foci as a pus.  A fluctuant nondistended cavity remains.Minimal surrounding induration.  Moderately tender. ?LYMPH NODES:  Lymph nodes in the neck are not enlarged. ?RESPIRATORY:  Lungs are clear, and breath sounds are equal bilaterally. Normal respiratory effort without pathologic use of accessory muscles. ?CARDIOVASCULAR: Heart is regular in rate and rhythm. ?GI: The abdomen is soft, nontender, and nondistended. There were no palpable masses.  I did not appreciate hepatosplenomegaly. There were normal bowel sounds. ? ?MUSCULOSKELETAL:  Symmetrical muscle tone appreciated in all four extremities.    ?SKIN: Skin turgor is normal. No pathologic skin lesions appreciated.  ?NEUROLOGIC:  Motor and sensation appear grossly normal.  Cranial nerves are grossly without defect. ?PSYCH:  Alert and oriented to person, place and time. Affect is appropriate for situation. ? ?Data Reviewed ?I have personally reviewed what is currently available of the patient's imaging, recent labs and medical records.   ?Labs:  ? ?  Latest Ref Rng & Units 02/04/2021  ?  1:30 PM 01/13/2021  ?  1:22 PM 12/30/2020  ?  2:37 PM  ?CBC  ?WBC 4.0 - 10.5 K/uL 4.0   3.4   3.6    ?Hemoglobin 13.0 - 17.0 g/dL 14.6   14.0   14.9    ?Hematocrit 39.0 - 52.0 % 42.6   40.3   43.3    ?Platelets 150.0 - 400.0 K/uL 162.0   177   117    ? ? ?  Latest Ref Rng & Units 02/04/2021  ?  1:30 PM 12/18/2019  ?  3:06 PM 06/17/2019  ? 12:38 PM  ?CMP  ?Glucose 70 - 99 mg/dL 159   138   123    ?BUN 6 - 23 mg/dL '16   13   17    '$ ?Creatinine 0.40 - 1.50 mg/dL 1.04   1.08   1.12    ?Sodium 135 - 145 mEq/L 138   137   137    ?Potassium 3.5 - 5.1 mEq/L 4.1   3.1   3.9    ?Chloride 96 - 112 mEq/L 101   99   100    ?CO2 19 - 32 mEq/L 30   29   32    ?Calcium 8.4 - 10.5 mg/dL 9.6   9.0   9.5    ?Total Protein 6.0 - 8.3 g/dL 7.7    7.6    ?Total Bilirubin 0.2 - 1.2 mg/dL 1.4    1.6    ?Alkaline Phos 39 - 117 U/L 62    76    ?AST 0 - 37 U/L 31    25    ?ALT 0 - 53 U/L 34    18    ? ? ? ? ?Imaging: ? ?Within last 24 hrs: No results found. ? ?Assessment ?   ?Infected sebaceous cyst of posterior neck/inferior occipital scalp. ?Patient Active Problem List  ? Diagnosis Date Noted  ? Abscess, neck 07/26/2021  ? Prostate cancer ([place]) 02/04/2021  ? Sleep disturbance 06/17/2019  ? Memory loss 04/10/2018  ? Sebaceous cyst 02/20/2018  ? Infected sebaceous cyst 04/04/2016  ? S/P total knee arthroplasty 03/06/2016  ?  Advance directive discussed  with patient 02/10/2016  ? Primary osteoarthritis of right knee 12/31/2015  ? Routine general medical examination at a health care facility 12/28/2010  ? OSTEOARTHRITIS 12/22/2009  ? PROSTATE CANCER, HX OF 12/22/2008  ? Hyperlipemia 10/08/2007  ? BENIGN PROSTATIC HYPERTROPHY 10/08/2007  ? ADENOMATOUS COLONIC POLYP 03/07/2007  ? Type 2 diabetes mellitus with other circulatory complications ([place]) 78/67/5449  ? Essential hypertension, benign 03/07/2007  ? ALLERGIC RHINITIS 03/07/2007  ? ? ?Plan ?   ?Incision and drainage, followed by packing changes with iodoform packing twice daily.  Patient may shower.  No need for additional antibiotics.  We will follow-up in 2 weeks or as needed for wound evaluation. ? ?Procedure: Patient was placed facedown on the procedure table in room 9.  Informed consent had been previously obtained. ?The neck/inferior scalp was prepped with ChloraPrep and draped in usual sterile manner.  Local infiltration of 1% lidocaine with epinephrine was provided, infiltrating the region to adequate anesthetic effect.  11 blade was utilized to incise the draining foci and connect the 2 dots which thus opened the cavity.  I then proceeded with debridement/evacuation of the contents of the cavity which were consistent with sebaceous material.  We then irrigated the wound.  And instructed the patient's caregiver, his wife and replacing the packing.  This will be done twice daily, and he may shower between dressing changes. ? ?Face-to-face time spent with the patient and accompanying care providers(if present) was  minutes, with more than 50% of the time spent counseling, educating, and coordinating care of the patient.   ? ?These notes generated with voice recognition software. I apologize for typographical errors. ? ?[name] M.D., FACS ?08/09/2021, 10:59 AM ? ? ? ? ?

## 2021-08-23 ENCOUNTER — Encounter: Payer: Self-pay | Admitting: Surgery

## 2021-08-23 ENCOUNTER — Ambulatory Visit: Payer: Medicare HMO | Admitting: Surgery

## 2021-08-23 VITALS — BP 125/77 | HR 80 | Temp 98.6°F | Ht 73.0 in | Wt 221.0 lb

## 2021-08-23 DIAGNOSIS — L723 Sebaceous cyst: Secondary | ICD-10-CM

## 2021-08-23 DIAGNOSIS — S1190XA Unspecified open wound of unspecified part of neck, initial encounter: Secondary | ICD-10-CM

## 2021-08-23 NOTE — Patient Instructions (Signed)
Continue to pack the area daily. You may also take a Q-Tip and put hydrogen peroxide on it and run it around the inside of the wound cavity and place a dressing over that if unable to pack the area.  Follow up here in 3 weeks. Call if you have any questions in the meantime.

## 2021-08-23 NOTE — Progress Notes (Signed)
[facility] POST-OP OFFICE VISIT  08/23/2021  HPI: Rodney Suarez is a 81 y.o. male 14 days s/p I&D of inflamed/infected sebaceous cyst of the posterior neck.  A from performing dressing changes, it appears that she may just be laying packing strip in the mouth of the wound rather than to the bottom/base of the cavity.  There is no evidence of inflammatory changes persisting, there is no evidence of continued drainage.  Vital signs: BP 125/77   Pulse 80   Temp 98.6 F (37 C)   Ht '6\' 1"'$  (1.854 m)   Wt 221 lb (100.2 kg)   SpO2 99%   BMI 29.16 kg/m    Physical Exam: Constitutional: He appears well.  Skin: Posterior neck wound has an opening of about 7 to 8 mm diameter, there is undermining extending cephalad for about a centimeter, with the immediate depth of the wound being about 7 mm.  With topical anesthetic of 2% lidocaine jelly, I utilized a 3 mm bone curette to complete an excisional debridement of the skin and subcutaneous tissues of this posterior neck wound.  The final wound measurements are unchanged.  Exudative debris, some persistent sebum was removed, punctate minimal bleeding tissue identified at the end of the procedure.  Assessment/Plan: This is a 81 y.o. male 14 days s/p incision and drainage of epidermal inclusion cyst of the posterior neck.  Now with persisting open wound requiring excisional debridement of the underlying deep tissues.  This was well-tolerated.  In addition to attempting packing, I advised that we utilize a cotton tip applicator with hydrogen peroxide to cleanse it on a twice daily basis.  We will have him follow-up in 3 weeks to assess his wound healing.  Patient Active Problem List   Diagnosis Date Noted   Abscess, neck 07/26/2021   Prostate cancer ([place]) 02/04/2021   Sleep disturbance 06/17/2019   Memory loss 04/10/2018   Sebaceous cyst 02/20/2018   Infected sebaceous cyst 04/04/2016   S/P total knee arthroplasty 03/06/2016    Advance directive discussed with patient 02/10/2016   Primary osteoarthritis of right knee 12/31/2015   Routine general medical examination at a health care facility 12/28/2010   OSTEOARTHRITIS 12/22/2009   PROSTATE CANCER, HX OF 12/22/2008   Hyperlipemia 10/08/2007   BENIGN PROSTATIC HYPERTROPHY 10/08/2007   ADENOMATOUS COLONIC POLYP 03/07/2007   Type 2 diabetes mellitus with other circulatory complications ([place]) 13/11/6576   Essential hypertension, benign 03/07/2007   ALLERGIC RHINITIS 03/07/2007    -    [name] M.D., FACS 08/23/2021, 9:37 AM

## 2021-09-01 ENCOUNTER — Other Ambulatory Visit: Payer: Self-pay | Admitting: Internal Medicine

## 2021-09-13 ENCOUNTER — Encounter: Payer: Self-pay | Admitting: Surgery

## 2021-09-13 ENCOUNTER — Ambulatory Visit (INDEPENDENT_AMBULATORY_CARE_PROVIDER_SITE_OTHER): Payer: Medicare HMO | Admitting: Surgery

## 2021-09-13 VITALS — BP 128/87 | HR 96 | Temp 98.5°F | Wt 222.8 lb

## 2021-09-13 DIAGNOSIS — L089 Local infection of the skin and subcutaneous tissue, unspecified: Secondary | ICD-10-CM

## 2021-09-13 DIAGNOSIS — L723 Sebaceous cyst: Secondary | ICD-10-CM | POA: Diagnosis not present

## 2021-09-13 DIAGNOSIS — Z09 Encounter for follow-up examination after completed treatment for conditions other than malignant neoplasm: Secondary | ICD-10-CM | POA: Diagnosis not present

## 2021-09-13 NOTE — Patient Instructions (Signed)
If you have any concerns or questions, please feel free to call our office. Follow up as needed.   Incision and Drainage, Care After This sheet gives you information about how to care for yourself after your procedure. Your health care provider may also give you more specific instructions. If you have problems or questions, contact your health care provider. What can I expect after the procedure? After the procedure, it is common to have: Pain or discomfort around the incision site. Blood, fluid, or pus (drainage) from the incision. Redness and firm skin around the incision site. Follow these instructions at home: Medicines Take over-the-counter and prescription medicines only as told by your health care provider. If you were prescribed an antibiotic medicine, use or take it as told by your health care provider. Do not stop using the antibiotic even if you start to feel better. Wound care Follow instructions from your health care provider about how to take care of your wound. Make sure you: Wash your hands with soap and water before and after you change your bandage (dressing). If soap and water are not available, use hand sanitizer. Change your dressing and packing as told by your health care provider. If your dressing is dry or stuck when you try to remove it, moisten or wet the dressing with saline or water so that it can be removed without harming your skin or tissues. If your wound is packed, leave it in place until your health care provider tells you to remove it. To remove the packing, moisten or wet the packing with saline or water so that it can be removed without harming your skin or tissues. Leave stitches (sutures), skin glue, or adhesive strips in place. These skin closures may need to stay in place for 2 weeks or longer. If adhesive strip edges start to loosen and curl up, you may trim the loose edges. Do not remove adhesive strips completely unless your health care provider tells  you to do that. Check your wound every day for signs of infection. Check for: More redness, swelling, or pain. More fluid or blood. Warmth. Pus or a bad smell. If you were sent home with a drain tube in place, follow instructions from your health care provider about: How to empty it. How to care for it at home.  General instructions Rest the affected area. Do not take baths, swim, or use a hot tub until your health care provider approves. Ask your health care provider if you may take showers. You may only be allowed to take sponge baths. Return to your normal activities as told by your health care provider. Ask your health care provider what activities are safe for you. Your health care provider may put you on activity or lifting restrictions. The incision will continue to drain. It is normal to have some clear or slightly bloody drainage. The amount of drainage should lessen each day. Do not apply any creams, ointments, or liquids unless you have been told to by your health care provider. Keep all follow-up visits as told by your health care provider. This is important. Contact a health care provider if: Your cyst or abscess returns. You have more redness, swelling, or pain around your incision. You have more fluid or blood coming from your incision. Your incision feels warm to the touch. You have pus or a bad smell coming from your incision. You have red streaks above or below the incision site. Get help right away if: You have severe pain  or bleeding. You cannot eat or drink without vomiting. You have a fever or chills. You have redness that spreads quickly. You have decreased urine output. You become short of breath. You have chest pain. You cough up blood. The affected area becomes numb or starts to tingle. These symptoms may represent a serious problem that is an emergency. Do not wait to see if the symptoms will go away. Get medical help right away. Call your local emergency  services (911 in the [place] Do not drive yourself to the hospital. Summary After this procedure, it is common to have fluid, blood, or pus coming from the surgery site. Follow all home care instructions. You will be told how to take care of your incision, how to check for infection, and how to take medicines. If you were prescribed an antibiotic medicine, take it as told by your health care provider. Do not stop taking the antibiotic even if you start to feel better. Contact a health care provider if you have increased redness, swelling, or pain around your incision. Get help right away if you have chest pain, you vomit, you cough up blood, or you have shortness of breath. Keep all follow-up visits as told by your health care provider. This is important. This information is not intended to replace advice given to you by your health care provider. Make sure you discuss any questions you have with your health care provider. Document Revised: 12/30/2020 Document Reviewed: 12/30/2020 Elsevier Patient Education  [place].

## 2021-09-13 NOTE — Progress Notes (Signed)
[facility] POST-OP OFFICE VISIT  09/13/2021  HPI: Rodney Suarez is a 81 y.o. male  s/p treatment of inflamed cyst epidermal inclusion cyst of the upper left posterior neck.  He has no further drainage, no further tenderness.  Denies any fevers or chills.  Reports getting excellent care from his wife.  Vital signs: BP 128/87   Pulse 96   Temp 98.5 F (36.9 C) (Oral)   Wt 222 lb 12.8 oz (101.1 kg)   SpO2 95%   BMI 29.39 kg/m    Physical Exam: Constitutional: He appears well.  Skin: The area appears to be fully healed, there is a slight irregularity and thickening of the skin there which I think is expected considering the scar.  No evidence of tenderness or residual cyst.  Assessment/Plan: This is a 81 y.o. male s/p treatment of inflamed/infected epidermal inclusion cyst of the left high posterior neck  Patient Active Problem List   Diagnosis Date Noted   Prostate cancer ([place]) 02/04/2021   Sleep disturbance 06/17/2019   Memory loss 04/10/2018   Sebaceous cyst 02/20/2018   Infected sebaceous cyst 04/04/2016   S/P total knee arthroplasty 03/06/2016   Advance directive discussed with patient 02/10/2016   Primary osteoarthritis of right knee 12/31/2015   Routine general medical examination at a health care facility 12/28/2010   OSTEOARTHRITIS 12/22/2009   PROSTATE CANCER, HX OF 12/22/2008   Hyperlipemia 10/08/2007   BENIGN PROSTATIC HYPERTROPHY 10/08/2007   ADENOMATOUS COLONIC POLYP 03/07/2007   Type 2 diabetes mellitus with other circulatory complications ([place]) 28/36/6294   Essential hypertension, benign 03/07/2007   ALLERGIC RHINITIS 03/07/2007    -I will be glad to see this gentleman back as needed for any surgical concerns or issues that might arise.   [name] M.D., FACS 09/13/2021, 8:57 AM

## 2021-09-14 ENCOUNTER — Other Ambulatory Visit: Payer: Self-pay | Admitting: Internal Medicine

## 2021-09-15 NOTE — Telephone Encounter (Signed)
Last office visit 07/29/21 for infected sebaceous cyst.  Last refilled 07/27/21 for #60 with no refills.  No future appointments with PCP.

## 2021-10-18 DIAGNOSIS — M1990 Unspecified osteoarthritis, unspecified site: Secondary | ICD-10-CM | POA: Diagnosis not present

## 2021-10-18 DIAGNOSIS — Z8546 Personal history of malignant neoplasm of prostate: Secondary | ICD-10-CM | POA: Diagnosis not present

## 2021-10-18 DIAGNOSIS — I10 Essential (primary) hypertension: Secondary | ICD-10-CM | POA: Diagnosis not present

## 2021-10-18 DIAGNOSIS — E876 Hypokalemia: Secondary | ICD-10-CM | POA: Diagnosis not present

## 2021-10-18 DIAGNOSIS — Z87891 Personal history of nicotine dependence: Secondary | ICD-10-CM | POA: Diagnosis not present

## 2021-10-18 DIAGNOSIS — H409 Unspecified glaucoma: Secondary | ICD-10-CM | POA: Diagnosis not present

## 2021-10-18 DIAGNOSIS — E1136 Type 2 diabetes mellitus with diabetic cataract: Secondary | ICD-10-CM | POA: Diagnosis not present

## 2021-10-18 DIAGNOSIS — Z7982 Long term (current) use of aspirin: Secondary | ICD-10-CM | POA: Diagnosis not present

## 2021-10-18 DIAGNOSIS — F419 Anxiety disorder, unspecified: Secondary | ICD-10-CM | POA: Diagnosis not present

## 2021-10-18 DIAGNOSIS — E785 Hyperlipidemia, unspecified: Secondary | ICD-10-CM | POA: Diagnosis not present

## 2021-10-18 DIAGNOSIS — Z008 Encounter for other general examination: Secondary | ICD-10-CM | POA: Diagnosis not present

## 2021-10-25 ENCOUNTER — Telehealth: Payer: Self-pay | Admitting: Internal Medicine

## 2021-10-25 NOTE — Telephone Encounter (Signed)
Patient called and stated that he has a lump in his stomach days ago that it left and came back today. Patient has been sent to access nurse.

## 2021-10-25 NOTE — Telephone Encounter (Signed)
[facility] - Client TELEPHONE ADVICE RECORD [facility] Patient Name: Rodney Suarez Gender: Male DOB: [birth year] Age: 81 Y 10 M 13 D Return Phone Number: [phone] (Primary) Address: City/ State/ Zip: [place]  [address] Client [facility] - Client Client Site [place] - Day Provider Viviana Simpler- MD Contact Type Call Who Is Calling Patient / Member / Family / Caregiver Call Type Triage / Clinical Relationship To Patient Self Return Phone Number [phone] (Primary) Chief Complaint Abdominal Pain Reason for Call Symptomatic / Request for [place]ells states two days ago, he felt a strange heavy feeling/pain in his stomach, it went away, but came back today. He feels fine otherwise. It feels like he ate too much. No fever. Translation No Nurse Assessment Nurse: Radford Pax, RN, Eugene Garnet Date/Time (Eastern Time): 10/25/2021 11:36:06 AM Confirm and document reason for call. If symptomatic, describe symptoms. ---Heavy feeling in stomach, coming and going. Abdominal fullness. Does the patient have any new or worsening symptoms? ---Yes Will a triage be completed? ---Yes Related visit to physician within the last 2 weeks? ---No Does the PT have any chronic conditions? (i.e. diabetes, asthma, this includes High risk factors for pregnancy, etc.) ---Yes List chronic conditions. ---diabetes Is this a behavioral health or substance abuse call? ---No Guidelines Guideline Title Affirmed Question Affirmed Notes Nurse Date/Time (Eastern Time) Abdominal Pain - Male [1] MODERATE pain (e.g., interferes with normal activities) AND [2] pain comes and goes (cramps) AND [3] present > 24 hours (Exception: Pain with Vomiting or Diarrhea - see that Guideline.) Turner, RN, Eugene Garnet 10/25/2021 11:38:19 AM PLEASE NOTE: All timestamps contained within this report are  represented as [place] Standard Time. CONFIDENTIALTY NOTICE: This fax transmission is intended only for the addressee. It contains information that is legally privileged, confidential or otherwise protected from use or disclosure. If you are not the intended recipient, you are strictly prohibited from reviewing, disclosing, copying using or disseminating any of this information or taking any action in reliance on or regarding this information. If you have received this fax in error, please notify us immediately by telephone so that we can arrange for its return to [place]. Phone: [phone], Toll-Free: [phone], Fax: [phone] Page: 2 of 2 Call Id: [id number] Maynard. Time Eilene Ghazi Time) Disposition Final User 10/25/2021 11:45:40 AM See PCP within 24 Hours Yes Radford Pax, RN, Eugene Garnet Final Disposition 10/25/2021 11:45:40 AM See PCP within 24 Hours Yes Turner, RN, Eugene Garnet Caller Disagree/Comply Comply Caller Understands Yes PreDisposition Call Doctor Care Advice Given Per Guideline SEE PCP WITHIN 24 HOURS: * IF OFFICE WILL BE OPEN: You need to be examined within the next 24 hours. Call your doctor (or NP/PA) when the office opens and make an appointment. DIET: * Drink adequate fluids. Eat a bland diet. * Avoid alcohol or caffeinated beverages. * Avoid greasy or fatty foods. BISMUTH SUBSALICYLATE (E.G., PEPTO-BISMOL): * This medicine can help reduce diarrhea, vomiting, and abdomen cramping. It is available over-the-counter (OTC) in a drugstore. CALL BACK IF: * Constant pain lasts over 2 hours * You become worse * Severe pain lasts over 1 hour CARE ADVICE given per Abdominal Pain - Male (Adult) guideline. Referrals REFERRED TO PCP OFFIC

## 2021-10-25 NOTE — Telephone Encounter (Signed)
Per appt notes pt already has appt with Dr Darnell Level on 10/26/21 at 3pm. Sending note to Dr Darnell Level and Lattie Haw CMA.

## 2021-10-25 NOTE — Telephone Encounter (Signed)
Will see tomrorow.

## 2021-10-26 ENCOUNTER — Ambulatory Visit (INDEPENDENT_AMBULATORY_CARE_PROVIDER_SITE_OTHER): Payer: Medicare HMO | Admitting: Family Medicine

## 2021-10-26 ENCOUNTER — Encounter: Payer: Self-pay | Admitting: Family Medicine

## 2021-10-26 VITALS — BP 124/68 | HR 83 | Temp 97.5°F | Ht 73.0 in | Wt 220.0 lb

## 2021-10-26 DIAGNOSIS — R0789 Other chest pain: Secondary | ICD-10-CM

## 2021-10-26 DIAGNOSIS — R21 Rash and other nonspecific skin eruption: Secondary | ICD-10-CM

## 2021-10-26 NOTE — Patient Instructions (Addendum)
I think you have costochondritis of left chest wall. See handout below. Treat with topical voltaren (over the counter) to tender areas, heating pad (covered in towel), gentle stretching of chest wall. Let us know if not improving with this.  For rash below left breast, use clotrimazole (over the counter) twice daily for 2-3 weeks.   Costochondritis  Costochondritis is inflammation of the tissue (cartilage) that connects the ribs to the breastbone (sternum). This causes pain in the front of the chest. The pain usually starts slowly and involves more than one rib. What are the causes? The exact cause of this condition is not always known. It results from stress on the cartilage where your ribs attach to your sternum. The cause of this stress could be: Chest injury. Exercise or activity, such as lifting. Severe coughing. What increases the risk? You are more likely to develop this condition if you: Are male. Are 68-1 years old. Recently started a new exercise or work activity. Have low levels of vitamin D. Have a condition that makes you cough frequently. What are the signs or symptoms? The main symptom of this condition is chest pain. The pain: Usually starts gradually and can be sharp or dull. Gets worse with deep breathing, coughing, or exercise. Gets better with rest. May be worse when you press on the affected area of your ribs and sternum. How is this diagnosed? This condition is diagnosed based on your symptoms, your medical history, and a physical exam. Your health care provider will check for pain when pressing on your sternum. You may also have tests to rule out other causes of chest pain. These may include: A chest X-ray to check for lung problems. An ECG (electrocardiogram) to see if you have a heart problem that could be causing the pain. An imaging scan to rule out a chest or rib fracture. How is this treated? This condition usually goes away on its own over time. Your  health care provider may prescribe an NSAID, such as ibuprofen, to reduce pain and inflammation. Treatment may also include: Resting and avoiding activities that make pain worse. Applying heat or ice to the area to reduce pain and inflammation. Doing exercises to stretch your chest muscles. If these treatments do not help, your health care provider may inject a numbing medicine at the sternum-rib connection to help relieve the pain. Follow these instructions at home: Managing pain, stiffness, and swelling     If directed, put ice on the painful area. To do this: Put ice in a plastic bag. Place a towel between your skin and the bag. Leave the ice on for 20 minutes, 2-3 times a day. If directed, apply heat to the affected area as often as told by your health care provider. Use the heat source that your health care provider recommends, such as a moist heat pack or a heating pad. Place a towel between your skin and the heat source. Leave the heat on for 20-30 minutes. Remove the heat if your skin turns bright red. This is especially important if you are unable to feel pain, heat, or cold. You may have a greater risk of getting burned. Activity Rest as told by your health care provider. Avoid activities that make pain worse. This includes any activities that use chest, abdominal, and side muscles. Do not lift anything that is heavier than 10 lb (4.5 kg), or the limit that you are told, until your health care provider says that it is safe. Return to your  normal activities as told by your health care provider. Ask your health care provider what activities are safe for you. General instructions Take over-the-counter and prescription medicines only as told by your health care provider. Keep all follow-up visits as told by your health care provider. This is important. Contact a health care provider if: You have chills or a fever. Your pain does not go away or it gets worse. You have a cough that  does not go away. Get help right away if: You have shortness of breath. You have severe chest pain that is not relieved by medicines, heat, or ice. These symptoms may represent a serious problem that is an emergency. Do not wait to see if the symptoms will go away. Get medical help right away. Call your local emergency services (911 in the [place] Do not drive yourself to the hospital.  Summary Costochondritis is inflammation of the tissue (cartilage) that connects the ribs to the breastbone (sternum). This condition causes pain in the front of the chest. Costochondritis results from stress on the cartilage where your ribs attach to your sternum. Treatment may include medicines, rest, heat or ice, and exercises. This information is not intended to replace advice given to you by your health care provider. Make sure you discuss any questions you have with your health care provider. Document Revised: 01/31/2019 Document Reviewed: 01/31/2019 Elsevier Patient Education  [employer].

## 2021-10-26 NOTE — Progress Notes (Unsigned)
Patient ID: Rodney Suarez, male    DOB: 1940-12-11, 81 y.o.   MRN: [id number]  This visit was conducted in person.  BP 124/68   Pulse 83   Temp (!) 97.5 F (36.4 C) (Temporal)   Ht '6\' 1"'$  (1.854 m)   Wt 220 lb (99.8 kg)   SpO2 96%   BMI 29.03 kg/m    CC: check lump in stomach Subjective:   HPI: Rodney Suarez is a 81 y.o. male presenting on 10/26/2021 for Mass (C/o lump in abd.  Noticed 10/23/21. Felt a heavy feeling and abd pain in epigastric region.  )   1 wk ago while sitting and talking with wife, had strange uncomfortable fullness sensation in upper abdomen. This did improve but sensation persists. This has not affected ability to eat.   No inciting or aggravating factors.   No fevers/chills, chest pain/tightness, shortness of breath, nausea/vomiting, diarrhea/constipation, palpitations. No heart burn indigestion or reflux. No h/o GERD. No dysphagia. No early satiety. No unexpected weight changes. No palpable mass to chest or abdominal wall.      Relevant past medical, surgical, family and social history reviewed and updated as indicated. Interim medical history since our last visit reviewed. Allergies and medications reviewed and updated. Outpatient Medications Prior to Visit  Medication Sig Dispense Refill   amLODipine (NORVASC) 10 MG tablet TAKE 1 TABLET BY MOUTH EVERY DAY 90 tablet 3   aspirin EC 81 MG tablet Take 81 mg by mouth daily.     KLOR-CON M20 20 MEQ tablet TAKE 1 TABLET BY MOUTH EVERY DAY 90 tablet 3   lisinopril-hydrochlorothiazide (ZESTORETIC) 20-12.5 MG tablet TAKE 2 TABLETS BY MOUTH EVERY DAY 180 tablet 3   LORazepam (ATIVAN) 1 MG tablet TAKE 1 TO 2 TABLETS BY MOUTH AT BEDTIME AS NEEDED FOR SLEEP 60 tablet 0   pravastatin (PRAVACHOL) 20 MG tablet TAKE 1 TABLET BY MOUTH EVERY DAY 90 tablet 1   timolol (TIMOPTIC) 0.5 % ophthalmic solution Place 1 drop into the right eye 2 (two) times daily.     doxycycline (VIBRA-TABS) 100 MG tablet Take 1 tablet (100 mg  total) by mouth 2 (two) times daily. 14 tablet 0   No facility-administered medications prior to visit.     Per HPI unless specifically indicated in ROS section below Review of Systems  Objective:  BP 124/68   Pulse 83   Temp (!) 97.5 F (36.4 C) (Temporal)   Ht '6\' 1"'$  (1.854 m)   Wt 220 lb (99.8 kg)   SpO2 96%   BMI 29.03 kg/m   Wt Readings from Last 3 Encounters:  10/26/21 220 lb (99.8 kg)  09/13/21 222 lb 12.8 oz (101.1 kg)  08/23/21 221 lb (100.2 kg)      Physical Exam Vitals and nursing note reviewed.  Constitutional:      Appearance: Normal appearance. He is not ill-appearing.  Cardiovascular:     Rate and Rhythm: Normal rate and regular rhythm.     Pulses: Normal pulses.     Heart sounds: Normal heart sounds. No murmur heard.    Comments: Occasional skipped beats Pulmonary:     Effort: Pulmonary effort is normal. No respiratory distress.     Breath sounds: Normal breath sounds. No wheezing, rhonchi or rales.  Chest:     Chest wall: Tenderness present.       Comments: Reproducible tenderness to palpation to left 4th/5th costochondral junction.  Skin:    General: Skin is warm and dry.  Findings: Rash present.          Comments: Hyperpigmented rash below left breast  Neurological:     Mental Status: He is alert.  Psychiatric:        Behavior: Behavior normal.       Results for orders placed or performed in visit on 05/11/21  PSA  Result Value Ref Range   Prostatic Specific Antigen 0.02 0.00 - 4.00 ng/mL    Assessment & Plan:   Problem List Items Addressed This Visit   None    No orders of the defined types were placed in this encounter.  No orders of the defined types were placed in this encounter.    Patient instructions: I think you have costochondritis of left chest wall. See handout below. Treat with topical voltaren (over the counter) to tender areas, heating pad (covered in towel), gentle stretching of chest wall. Let us know if not  improving with this.  For rash below left breast, use clotrimazole (over the counter) twice daily for 2-3 weeks.   Follow up plan: No follow-ups on file.  Ria Bush, MD

## 2021-10-27 NOTE — Assessment & Plan Note (Signed)
Hyperpigmented rash below L breast consistent with mild intertrigo - suggested clotrimazole OTC.

## 2021-10-27 NOTE — Assessment & Plan Note (Addendum)
Non-cardiac chest pain.  Palpation of left lower costochondral junction reproduces pain he has been experiencing - this is consistent with left sided costochondritis. Discussed with patient, handout provided. rec supportive measures of stretching, heating pad, and topical voltaren gel.

## 2021-11-03 ENCOUNTER — Ambulatory Visit (INDEPENDENT_AMBULATORY_CARE_PROVIDER_SITE_OTHER): Payer: Medicare HMO | Admitting: Internal Medicine

## 2021-11-03 ENCOUNTER — Encounter: Payer: Self-pay | Admitting: Internal Medicine

## 2021-11-03 DIAGNOSIS — E1159 Type 2 diabetes mellitus with other circulatory complications: Secondary | ICD-10-CM | POA: Diagnosis not present

## 2021-11-03 DIAGNOSIS — R0789 Other chest pain: Secondary | ICD-10-CM | POA: Diagnosis not present

## 2021-11-03 LAB — POCT GLYCOSYLATED HEMOGLOBIN (HGB A1C): Hemoglobin A1C: 7.7 % — AB (ref 4.0–5.6)

## 2021-11-03 NOTE — Progress Notes (Signed)
Subjective:    Patient ID: Rodney Suarez, male    DOB: [birth year], 81 y.o.   MRN: [id number]  HPI Here due to recurrent left sided chest pain  Still having some chest pain---across both sides more on left than right under breasts Diclofenac topical has helped Seemed better but now has come back Does some yard work but doesn't remember anything different or any injury No SOB Can feel it if he moves it a certain way  Checks sugars daily Usually under 110 Careful with eating  Current Outpatient Medications on File Prior to Visit  Medication Sig Dispense Refill   amLODipine (NORVASC) 10 MG tablet TAKE 1 TABLET BY MOUTH EVERY DAY 90 tablet 3   aspirin EC 81 MG tablet Take 81 mg by mouth daily.     diclofenac Sodium (VOLTAREN) 1 % GEL Apply topically 4 (four) times daily.     KLOR-CON M20 20 MEQ tablet TAKE 1 TABLET BY MOUTH EVERY DAY 90 tablet 3   lisinopril-hydrochlorothiazide (ZESTORETIC) 20-12.5 MG tablet TAKE 2 TABLETS BY MOUTH EVERY DAY 180 tablet 3   LORazepam (ATIVAN) 1 MG tablet TAKE 1 TO 2 TABLETS BY MOUTH AT BEDTIME AS NEEDED FOR SLEEP 60 tablet 0   pravastatin (PRAVACHOL) 20 MG tablet TAKE 1 TABLET BY MOUTH EVERY DAY 90 tablet 1   timolol (TIMOPTIC) 0.5 % ophthalmic solution Place 1 drop into the right eye 2 (two) times daily.     No current facility-administered medications on file prior to visit.    Allergies  Allergen Reactions   Meloxicam Other (See Comments)    Cramps at night   Metformin     REACTION: diarrhea    Past Medical History:  Diagnosis Date   Allergy    BPH (benign prostatic hypertrophy)    Diabetes mellitus    ED (erectile dysfunction)    History of prostate cancer    Hx of adenomatous colonic polyps    Hyperlipidemia    Hypertension    Osteoarthritis     Past Surgical History:  Procedure Laterality Date   APPENDECTOMY  1956   FOOT FRACTURE SURGERY  1996   KNEE ARTHROPLASTY Right 03/06/2016   Procedure: COMPUTER ASSISTED TOTAL KNEE  ARTHROPLASTY;  Surgeon: Dereck Leep, MD;  Location: [facility] ORS;  Service: Orthopedics;  Laterality: Right;   KNEE ARTHROSCOPY  11/2005   right knee   PROSTATE SURGERY  01/2009   I-131 implants   SINUS SURGERY WITH INSTATRAK  09/2007    History reviewed. No pertinent family history.  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  Tobacco Use   Smoking status: Former    Types: Cigarettes    Quit date: 04/03/1982    Years since quitting: 39.6    Passive exposure: Past   Smokeless tobacco: Never  Substance and Sexual Activity   Alcohol use: No   Drug use: No   Sexual activity: Not on file  Other Topics Concern   Not on file  Social History Narrative   No living will   Wife and daughter should be health care POAs   Discussed DNR--he requests this (done 10/14/12)   No tube feedings if cognitively unaware  Social Determinants of Health   Financial Resource Strain: Not on file  Food Insecurity: Not on file  Transportation Needs: Not on file  Physical Activity: Not on file  Stress: Not on file  Social Connections: Not on file  Intimate Partner Violence: Not on file   Review of Systems Can still feel the cyst in neck---but did close up Weight is stable    Objective:   Physical Exam Constitutional:      Appearance: Normal appearance.  Cardiovascular:     Rate and Rhythm: Normal rate and regular rhythm.     Heart sounds: No murmur heard.    No gallop.  Pulmonary:     Effort: Pulmonary effort is normal.     Breath sounds: Normal breath sounds. No wheezing or rales.     Comments: Tenderness along T7 or 8----under breast. No other areas of tenderness (like sternum or costochondral margin) Musculoskeletal:     Cervical back:  Neck supple.     Right lower leg: No edema.     Left lower leg: No edema.  Lymphadenopathy:     Cervical: No cervical adenopathy.  Neurological:     Mental Status: He is alert.  Psychiatric:        Mood and Affect: Mood normal.        Behavior: Behavior normal.            Assessment & Plan:

## 2021-11-03 NOTE — Addendum Note (Signed)
Addended by: Pilar Grammes on: 11/03/2021 03:58 PM   Modules accepted: Orders

## 2021-11-03 NOTE — Assessment & Plan Note (Signed)
Improved with diclofenac--then came back Reassured---still seems unrelated to heart--and not worrisome Can try heat &/or diclofenac topical

## 2021-11-03 NOTE — Assessment & Plan Note (Addendum)
Still seems to have good control with just diet A1c is up some to 7.7% Asked him to be careful with eating---but given age, will hold off on meds

## 2021-11-08 ENCOUNTER — Inpatient Hospital Stay: Payer: Medicare HMO | Attending: Oncology

## 2021-11-08 DIAGNOSIS — C61 Malignant neoplasm of prostate: Secondary | ICD-10-CM | POA: Insufficient documentation

## 2021-11-08 LAB — PSA: Prostatic Specific Antigen: 0.02 ng/mL (ref 0.00–4.00)

## 2021-11-15 ENCOUNTER — Ambulatory Visit: Payer: Medicare HMO | Admitting: Radiation Oncology

## 2021-11-16 ENCOUNTER — Ambulatory Visit
Admission: RE | Admit: 2021-11-16 | Discharge: 2021-11-16 | Disposition: A | Discharge status: Home / Self Care | Payer: Medicare HMO | Source: Ambulatory Visit | Attending: Radiation Oncology | Admitting: Radiation Oncology

## 2021-11-16 ENCOUNTER — Encounter: Payer: Self-pay | Admitting: Radiation Oncology

## 2021-11-16 VITALS — BP 133/87 | HR 44 | Temp 98.0°F | Resp 18 | Ht 73.0 in | Wt 222.8 lb

## 2021-11-16 DIAGNOSIS — C61 Malignant neoplasm of prostate: Secondary | ICD-10-CM | POA: Insufficient documentation

## 2021-11-16 DIAGNOSIS — Z923 Personal history of irradiation: Secondary | ICD-10-CM | POA: Insufficient documentation

## 2021-11-16 DIAGNOSIS — Z191 Hormone sensitive malignancy status: Secondary | ICD-10-CM | POA: Diagnosis not present

## 2021-11-16 NOTE — Progress Notes (Signed)
Radiation Oncology Follow up Note  Name: Rodney Suarez   Date:   11/16/2021 MRN:  [id number] DOB: 1941/01/05    This 81 y.o. male presents to the clinic today for    .  44-monthfollow-up status post salvage IMRT radiation therapy 12 years out from an I-125 interstitial implant with biochemical failure.  REFERRING PROVIDER: LVenia Carbon MD  HPI: Patient is an 81year old male now out 10 months having completed salvage radiation therapy and patient 12 years out from an interstitial interstitial implant.  For adenocarcinoma the prostate.  Seen today in routine follow-up he is doing well.  He specifically denies any increased lower urinary tract symptoms diarrhea or fatigue.  His PSA remains 0.02 the same as it was 6 months prior.  He specifically denies any increased lower urinary tract symptoms diarrhea or fatigue.  COMPLICATIONS OF TREATMENT: none  FOLLOW UP COMPLIANCE: keeps appointments   PHYSICAL EXAM:  BP 133/87   Pulse (!) 44   Temp 98 F (36.7 C)   Resp 18   Ht '6\' 1"'$  (1.854 m)   Wt 222 lb 12.8 oz (101.1 kg)   BMI 29.39 kg/m  Well-developed well-nourished patient in NAD. HEENT reveals PERLA, EOMI, discs not visualized.  Oral cavity is clear. No oral mucosal lesions are identified. Neck is clear without evidence of cervical or supraclavicular adenopathy. Lungs are clear to A&P. Cardiac examination is essentially unremarkable with regular rate and rhythm without murmur rub or thrill. Abdomen is benign with no organomegaly or masses noted. Motor sensory and DTR levels are equal and symmetric in the upper and lower extremities. Cranial nerves II through XII are grossly intact. Proprioception is intact. No peripheral adenopathy or edema is identified. No motor or sensory levels are noted. Crude visual fields are within normal range.  RADIOLOGY RESULTS: No current films for review  PLAN: Present time patient is under excellent biochemical control of his prostate cancer.  And  pleased with his overall progress and low side effect profile.  I have asked to see him back in 6 months with a follow-up PSA.  Patient knows to call with any concerns.  I would like to take this opportunity to thank you for allowing me to participate in the care of your patient..[name] MD

## 2021-11-17 ENCOUNTER — Other Ambulatory Visit: Payer: Self-pay | Admitting: Internal Medicine

## 2021-11-18 NOTE — Telephone Encounter (Signed)
Last Filled 09-15-21 #60 Last OV 07-29-21 Next OV 02-15-22 [facility] [address] [address]

## 2021-12-06 ENCOUNTER — Telehealth: Payer: Self-pay | Admitting: *Deleted

## 2021-12-06 NOTE — Telephone Encounter (Signed)
Spoke to patient by telephone and was advised that thr pain has been going on for about a month. Patient stated that the pain is in the upper part of his stomach and it has gotten a little worse in the past two days. Patient stated that he was told to go to the ER and decided not to. Patient was offered an appointment today with a NP which he declined stating that he would rather wait and see Dr. Silvio Pate. Patient scheduled for an appointment with Dr. Silvio Pate tomorrow 12/07/21 at 8:30 am. Patient was given ER precautions and he verbalized understanding.

## 2021-12-06 NOTE — Telephone Encounter (Signed)
PLEASE NOTE: All timestamps contained within this report are represented as [place] Standard Time. CONFIDENTIALTY NOTICE: This fax transmission is intended only for the addressee. It contains information that is legally privileged, confidential or otherwise protected from use or disclosure. If you are not the intended recipient, you are strictly prohibited from reviewing, disclosing, copying using or disseminating any of this information or taking any action in reliance on or regarding this information. If you have received this fax in error, please notify us immediately by telephone so that we can arrange for its return to [place]. Phone: [phone], Toll-Free: [phone], Fax: [phone] Page: 1 of 2 Call Id: [id number] [place] Night - Client TELEPHONE ADVICE RECORD [facility] Patient Name: Rodney Suarez Gender: Male DOB: Jun 01, 1940 Age: 81 Y 11 M 23 D Return Phone Number: [phone] (Primary) Address: City/ State/ Zip: [place]   [address] Client [place] Night - Client Client Site [facility] - Night Provider Viviana Simpler- MD Contact Type Call Who Is Calling Patient / Member / Family / Caregiver Call Type Triage / Clinical Relationship To Patient Self Return Phone Number [phone] (Primary) Chief Complaint SEVERE ABDOMINAL PAIN - Severe pain in abdomen Reason for Call Symptomatic / Request for [place] is experiencing sharp adnominal pain. Translation No Nurse Assessment Nurse: Laurann Montana, RN, Fransico Meadow Date/Time Eilene Ghazi Time): 12/05/2021 11:51:42 AM Confirm and document reason for call. If symptomatic, describe symptoms. ---Patient has abd pain. Patient has no other sx. Does the patient have any new or worsening symptoms? ---Yes Will a triage be completed? ---Yes Related visit to physician within the last 2 weeks? ---No Does the PT have any chronic  conditions? (i.e. diabetes, asthma, this includes High risk factors for pregnancy, etc.) ---Yes List chronic conditions. ---DM Is this a behavioral health or substance abuse call? ---No Guidelines Guideline Title Affirmed Question Affirmed Notes Nurse Date/Time (Eastern Time) Abdominal Pain - Male [1] SEVERE pain (e.g., excruciating) AND [2] present > 1 hour Laurann Montana, Charlean Sanfilippo [facility] 12/05/2021 11:53:57 AM Disp. Time Eilene Ghazi Time) Disposition Final User 12/05/2021 11:49:48 AM Send to Urgent Clarnce Flock 12/05/2021 11:58:02 AM Go to ED Now Yes Laurann Montana, RN, Fransico Meadow Final Disposition 12/05/2021 11:58:02 AM Go to ED Now Yes Laurann Montana, RN, Fransico Meadow PLEASE NOTE: All timestamps contained within this report are represented as [place] Standard Time. CONFIDENTIALTY NOTICE: This fax transmission is intended only for the addressee. It contains information that is legally privileged, confidential or otherwise protected from use or disclosure. If you are not the intended recipient, you are strictly prohibited from reviewing, disclosing, copying using or disseminating any of this information or taking any action in reliance on or regarding this information. If you have received this fax in error, please notify us immediately by telephone so that we can arrange for its return to [place]. Phone: [phone], Toll-Free: [phone], Fax: [phone] Page: 2 of 2 Call Id: [id number] [place] Disagree/Comply Disagree Caller Understands Yes PreDisposition InappropriateToAsk Care Advice Given Per Guideline GO TO ED NOW: * You need to be seen in the Emergency Department. * Leave now. Drive carefully. ANOTHER ADULT SHOULD DRIVE: * It is better and safer if another adult drives instead of you. CARE ADVICE given per Abdominal Pain - Male (Adult) guideline. Referrals GO TO FACILITY REFUSED

## 2021-12-06 NOTE — Telephone Encounter (Signed)
It sounds like this could wait for the morning

## 2021-12-07 ENCOUNTER — Ambulatory Visit (INDEPENDENT_AMBULATORY_CARE_PROVIDER_SITE_OTHER): Payer: Medicare HMO | Admitting: Internal Medicine

## 2021-12-07 ENCOUNTER — Encounter: Payer: Self-pay | Admitting: Internal Medicine

## 2021-12-07 ENCOUNTER — Ambulatory Visit (INDEPENDENT_AMBULATORY_CARE_PROVIDER_SITE_OTHER)
Admission: RE | Admit: 2021-12-07 | Discharge: 2021-12-07 | Disposition: A | Discharge status: Home / Self Care | Payer: Medicare HMO | Source: Ambulatory Visit | Attending: Internal Medicine | Admitting: Internal Medicine

## 2021-12-07 VITALS — BP 120/76 | HR 48 | Temp 97.4°F | Ht 73.0 in | Wt 222.0 lb

## 2021-12-07 DIAGNOSIS — R0789 Other chest pain: Secondary | ICD-10-CM | POA: Diagnosis not present

## 2021-12-07 DIAGNOSIS — R911 Solitary pulmonary nodule: Secondary | ICD-10-CM | POA: Diagnosis not present

## 2021-12-07 NOTE — Progress Notes (Signed)
Subjective:    Patient ID: Rodney Suarez, male    DOB: 1940-06-25, 81 y.o.   MRN: [id number]  HPI Here due to persistent left sided chest pain  Pain below left breast had improved considerably The diclofenac gel had helped Now really bad in the past week Constant pain--throbbing in past couple of days  No fever No cough No SOB Not pleuritic  Current Outpatient Medications on File Prior to Visit  Medication Sig Dispense Refill   amLODipine (NORVASC) 10 MG tablet TAKE 1 TABLET BY MOUTH EVERY DAY 90 tablet 3   aspirin EC 81 MG tablet Take 81 mg by mouth daily.     diclofenac Sodium (VOLTAREN) 1 % GEL Apply topically 4 (four) times daily.     KLOR-CON M20 20 MEQ tablet TAKE 1 TABLET BY MOUTH EVERY DAY 90 tablet 3   lisinopril-hydrochlorothiazide (ZESTORETIC) 20-12.5 MG tablet TAKE 2 TABLETS BY MOUTH EVERY DAY 180 tablet 3   LORazepam (ATIVAN) 1 MG tablet TAKE 1 TO 2 TABLETS BY MOUTH AT BEDTIME AS NEEDED FOR SLEEP 60 tablet 0   pravastatin (PRAVACHOL) 20 MG tablet TAKE 1 TABLET BY MOUTH EVERY DAY 90 tablet 1   timolol (TIMOPTIC) 0.5 % ophthalmic solution Place 1 drop into the right eye 2 (two) times daily.     No current facility-administered medications on file prior to visit.    Allergies  Allergen Reactions   Meloxicam Other (See Comments)    Cramps at night   Metformin     REACTION: diarrhea    Past Medical History:  Diagnosis Date   Allergy    BPH (benign prostatic hypertrophy)    Diabetes mellitus    ED (erectile dysfunction)    History of prostate cancer    Hx of adenomatous colonic polyps    Hyperlipidemia    Hypertension    Osteoarthritis     Past Surgical History:  Procedure Laterality Date   APPENDECTOMY  1956   FOOT FRACTURE SURGERY  1996   KNEE ARTHROPLASTY Right 03/06/2016   Procedure: COMPUTER ASSISTED TOTAL KNEE ARTHROPLASTY;  Surgeon: Dereck Leep, MD;  Location: [facility] ORS;  Service: Orthopedics;  Laterality: Right;   KNEE ARTHROSCOPY  11/2005    right knee   PROSTATE SURGERY  01/2009   I-131 implants   SINUS SURGERY WITH INSTATRAK  09/2007    History reviewed. No pertinent family history.  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  Tobacco Use   Smoking status: Former    Types: Cigarettes    Quit date: 04/03/1982    Years since quitting: 39.7    Passive exposure: Past   Smokeless tobacco: Never  Substance and Sexual Activity   Alcohol use: No   Drug use: No   Sexual activity: Not on file  Other Topics Concern   Not on file  Social History Narrative   No living will   Wife and daughter should be health care POAs   Discussed DNR--he requests this (done 10/14/12)   No tube feedings if cognitively unaware   Social Determinants of Health   Financial Resource Strain: Not on file  Food Insecurity: Not on file  Transportation Needs: Not on  file  Physical Activity: Not on file  Stress: Not on file  Social Connections: Not on file  Intimate Partner Violence: Not on file   Review of Systems Doesn't limit his activities Not related to activity     Objective:   Physical Exam Constitutional:      Appearance: Normal appearance.  Cardiovascular:     Rate and Rhythm: Normal rate and regular rhythm.     Heart sounds: No murmur heard.    No gallop.     Comments: Periods of bigeminy--but mostly just regular Pulmonary:     Effort: Pulmonary effort is normal.     Breath sounds: Normal breath sounds. No wheezing or rales.     Comments: Some tenderness around left T5 in mid clavicular line Musculoskeletal:     Cervical back: Neck supple.     Right lower leg: No edema.     Left lower leg: No edema.  Lymphadenopathy:     Cervical: No cervical adenopathy.   Neurological:     Mental Status: He is alert.            Assessment & Plan:

## 2021-12-07 NOTE — Assessment & Plan Note (Addendum)
Persistent and now worse---fairly constant Not really pleuritic and no signs of infection Doesn't seem cardiac Diclofenac had helped Last PSA essentially 0----doubt this could be bony met Will check CXR now though CXR only shows small right pulmonary nodules Will try lidocaine patch Refer to pulmonary

## 2021-12-07 NOTE — Patient Instructions (Signed)
Please try an over the counter lidocaine patch over the painful area---you can use it for 12 hours a day if it helps. You can still use the diclofenac gel at other times.

## 2021-12-15 ENCOUNTER — Other Ambulatory Visit: Payer: Self-pay | Admitting: Internal Medicine

## 2021-12-20 ENCOUNTER — Encounter: Payer: Self-pay | Admitting: Pulmonary Disease

## 2021-12-20 ENCOUNTER — Ambulatory Visit (INDEPENDENT_AMBULATORY_CARE_PROVIDER_SITE_OTHER): Payer: Medicare HMO | Admitting: Pulmonary Disease

## 2021-12-20 VITALS — BP 146/84 | HR 79 | Temp 98.5°F | Ht 73.0 in | Wt 221.6 lb

## 2021-12-20 DIAGNOSIS — Z8546 Personal history of malignant neoplasm of prostate: Secondary | ICD-10-CM

## 2021-12-20 DIAGNOSIS — R918 Other nonspecific abnormal finding of lung field: Secondary | ICD-10-CM

## 2021-12-20 DIAGNOSIS — I493 Ventricular premature depolarization: Secondary | ICD-10-CM

## 2021-12-20 NOTE — Progress Notes (Signed)
Subjective:    Patient ID: Rodney Suarez, male    DOB: 07/11/1940, 81 y.o.   MRN: [id number] Patient Care Team: Venia Carbon, MD as PCP - General  Chief Complaint  Patient presents with   Pulmonary Consult    Referred by Dr Silvio Pate for eval if incidental lung nodule. He denies any respiratory co's.    HPI This is a very pleasant 81 year old former smoker (2 PY) history as noted below, who presents for evaluation of multiple lung nodules on the right base on chest x-ray.  He is kindly referred by Dr. Viviana Simpler.  The patient noted some left-sided chest pain that started around the first week of September.  He was seen by Dr. Silvio Pate on 6 September.  The pain was below the left breast and it felt throbbing at times.  Patient had had a similar episode around August but this had resolved with diclofenac topical cream.  He had significant point tenderness.  On 6 September patient had a chest x-ray as part of the investigation of this pain.  Chest x-ray showed small right lower lobe pulmonary nodules that could be due to infectious or inflammatory etiology.  Patient has not had any fevers, chills or sweats.  No cough or sputum production.  No orthopnea or paroxysmal nocturnal dyspnea.  Chest pain currently has resolved.  He has not had any dyspnea or wheezing.  No lower extremity edema nor calf tenderness.  He does not endorse any other symptomatology.  All imaging, laboratory data and histories have been reviewed and as noted.  Review of Systems A 10 point review of systems was performed and it is as noted above otherwise negative.  Past Medical History:  Diagnosis Date   Allergy    BPH (benign prostatic hypertrophy)    Diabetes mellitus    ED (erectile dysfunction)    History of prostate cancer    Hx of adenomatous colonic polyps    Hyperlipidemia    Hypertension    Osteoarthritis    Past Surgical History:  Procedure Laterality Date   APPENDECTOMY  1956   FOOT FRACTURE SURGERY   1996   KNEE ARTHROPLASTY Right 03/06/2016   Procedure: COMPUTER ASSISTED TOTAL KNEE ARTHROPLASTY;  Surgeon: Dereck Leep, MD;  Location: [facility] ORS;  Service: Orthopedics;  Laterality: Right;   KNEE ARTHROSCOPY  11/2005   right knee   PROSTATE SURGERY  01/2009   I-131 implants   SINUS SURGERY WITH INSTATRAK  09/2007   Patient Active Problem List   Diagnosis Date Noted   Prostate cancer ([place]) 02/04/2021   Sleep disturbance 06/17/2019   Memory loss 04/10/2018   Left-sided chest wall pain 10/12/2016   Skin rash 09/18/2016   S/P total knee arthroplasty 03/06/2016   Advance directive discussed with patient 02/10/2016   Primary osteoarthritis of right knee 12/31/2015   Routine general medical examination at a health care facility 12/28/2010   OSTEOARTHRITIS 12/22/2009   PROSTATE CANCER, HX OF 12/22/2008   Hyperlipemia 10/08/2007   BENIGN PROSTATIC HYPERTROPHY 10/08/2007   ADENOMATOUS COLONIC POLYP 03/07/2007   Type 2 diabetes mellitus with other circulatory complications ([place]) 03/50/0938   Essential hypertension, benign 03/07/2007   ALLERGIC RHINITIS 03/07/2007   No family history on file.  Social History   Tobacco Use   Smoking status: Former    Packs/day: 0.50    Years: 55.00    Total pack years: 27.50    Types: Cigarettes    Quit date: 04/04/2011    Years  since quitting: 10.7    Passive exposure: Never   Smokeless tobacco: Never  Substance Use Topics   Alcohol use: No   Allergies  Allergen Reactions   Meloxicam Other (See Comments)    Cramps at night   Metformin     REACTION: diarrhea   Current Meds  Medication Sig   amLODipine (NORVASC) 10 MG tablet TAKE 1 TABLET BY MOUTH EVERY DAY   aspirin EC 81 MG tablet Take 81 mg by mouth daily.   diclofenac Sodium (VOLTAREN) 1 % GEL Apply topically 4 (four) times daily.   KLOR-CON M20 20 MEQ tablet TAKE 1 TABLET BY MOUTH EVERY DAY   lisinopril-hydrochlorothiazide (ZESTORETIC) 20-12.5 MG tablet TAKE 2 TABLETS BY MOUTH EVERY  DAY   LORazepam (ATIVAN) 1 MG tablet TAKE 1 TO 2 TABLETS BY MOUTH AT BEDTIME AS NEEDED FOR SLEEP   pravastatin (PRAVACHOL) 20 MG tablet TAKE 1 TABLET BY MOUTH EVERY DAY   timolol (TIMOPTIC) 0.5 % ophthalmic solution Place 1 drop into the right eye 2 (two) times daily.   Immunization History  Administered Date(s) Administered   Fluad Quad(high Dose 65+) 02/04/2021   H1N1 04/17/2008   Influenza Inj Mdck Quad With Preservative 01/30/2018   Influenza Split 12/28/2010, 02/07/2012   Influenza Whole 02/02/1999, 12/30/2008, 12/22/2009   Influenza,inj,Quad PF,6+ Mos 01/23/2013, 12/31/2013, 01/15/2015, 02/02/2016, 03/20/2017, 01/21/2020   Influenza-Unspecified 11/18/2018   PFIZER(Purple Top)SARS-COV-2 Vaccination 05/23/2019, 06/13/2019   Pneumococcal Conjugate-13 01/15/2015   Pneumococcal Polysaccharide-23 10/08/2007, 03/20/2017   Td 05/01/1996, 10/08/2007       Objective:   Physical Exam BP (!) 146/84 (BP Location: Left Arm, Cuff Size: Normal)   Pulse 79   Temp 98.5 F (36.9 C) (Oral)   Ht '6\' 1"'$  (1.854 m)   Wt 221 lb 9.6 oz (100.5 kg)   SpO2 97% Comment: on RA  BMI 29.24 kg/m  GENERAL: Well-developed, well-nourished gentleman, no acute distress, no conversational dyspnea.  Awake, alert fully oriented.  Fully ambulatory. HEAD: Normocephalic, atraumatic.  EYES: Pupils equal, round, reactive to light.  No scleral icterus.  MOUTH: Poor dentition.  Oral mucosa moist. NECK: Supple. No thyromegaly. Trachea midline. No JVD.  No adenopathy. PULMONARY: Good air entry bilaterally.  No adventitious sounds. CARDIOVASCULAR: S1 and S2.  Regular rate and rhythm with frequent extrasystoles.  EKG: Sinus rhythm with occasional premature ventricular complexes otherwise normal ECG. ABDOMEN: Benign. MUSCULOSKELETAL: No joint deformity, no clubbing, no edema.  NEUROLOGIC: No overt focal deficit, no gait disturbance, speech is fluent. SKIN: Intact,warm,dry. PSYCH: Mood and behavior normal.  Chest x-ray  from 07 December 2021, independently reviewed, showing possible micronodules on the right base (bracketed):   EKG was performed today that shows normal sinus rhythm, no ST-T wave changes.  There are occasional ventricular premature complexes.  Compared to tracings performed previously no significant change with the exception of the occasional ventricular premature complexes.    Assessment & Plan:     ICD-10-CM   1. Abnormal chest x-ray with multiple lung nodules  R91.8 CT CHEST WO CONTRAST   Contains CT chest to evaluate further This may represent inflammatory etiology    2. Ventricular extrasystoles  I49.3    Appear to be benign No other EKG changes Patient asymptomatic     3. History of prostate cancer  Z85.46 CT CHEST WO CONTRAST   This issue adds complexity to his management Being followed by urology and oncology     Orders Placed This Encounter  Procedures   CT CHEST WO CONTRAST  Standing Status:   Future    Standing Expiration Date:   02/19/2022    Order Specific Question:   Preferred imaging location?    Answer:   [facility]   We will see the patient in follow-up in 6 to 8 weeks time he is to contact us prior to that time should any new difficulties arise.  Renold Don, MD Advanced Bronchoscopy [facility]    *This note was dictated using voice recognition software/Dragon.  Despite best efforts to proofread, errors can occur which can change the meaning. Any transcriptional errors that result from this process are unintentional and may not be fully corrected at the time of dictation.

## 2021-12-20 NOTE — Patient Instructions (Signed)
We are going to get a scan of your chest that will allow [place] to check those small spots seen in your chest x-ray.  We will see him in follow-up in 6 to 8 weeks time.  We will call you the results of your scan as soon as this is done.  Your heart tracing today looks good you just have some few extra beats of the heart but these do not appear to be of major significance.

## 2021-12-21 ENCOUNTER — Telehealth: Payer: Self-pay | Admitting: Pulmonary Disease

## 2021-12-21 NOTE — Telephone Encounter (Signed)
I called Rodney Suarez and explained that I had already spoke with him and scheduled his Chest CT on 12/27/21 '@9'$ :40am at [facility]. He forgot about our conversation but remembers now

## 2021-12-21 NOTE — Telephone Encounter (Signed)
Rodney Suarez, did you call him about his CT being scheduled? I did not call him. Please reach out to him about the CT info. Thanks!

## 2021-12-27 ENCOUNTER — Ambulatory Visit: Payer: Medicare HMO | Attending: Pulmonary Disease

## 2021-12-27 ENCOUNTER — Encounter: Payer: Self-pay | Admitting: Urology

## 2021-12-27 ENCOUNTER — Ambulatory Visit (INDEPENDENT_AMBULATORY_CARE_PROVIDER_SITE_OTHER): Payer: Medicare HMO | Admitting: Urology

## 2021-12-27 VITALS — BP 147/78 | HR 89 | Ht 73.0 in | Wt 219.0 lb

## 2021-12-27 DIAGNOSIS — C61 Malignant neoplasm of prostate: Secondary | ICD-10-CM

## 2021-12-27 NOTE — Progress Notes (Signed)
12/27/2021 12:12 PM   Rodney Suarez 1940-05-31 [id number]  Referring provider: Venia Carbon, MD [address] [place],  [place] [address]  Chief Complaint  Patient presents with   Elevated PSA    HPI: 17 old male with a personal history of recurrent prostate cancer returns today for follow-up.  He is status post brachytherapy in the remote past, 2010.     In the interim, he is undergone PSMA PET scan on 09/08/2020.  This shows concern for a focal local recurrence within the prostate at the right apex. There is no associated pelvic or metastatic disease.  There is an incidental left 4 mm intramuscular node of unclear significance.   He underwent a MRI of pelvis on 11/13/2020 that visualized a 0.5 cm in short axis left obturator lymph node appears to correspond most closely to the region of focal hypermetabolic activity along the left obturator fossa seen on recent Pylarify PET CT. This raises the concern of nodal involvement despite the small size of the lymph node   He started salvage radiation 12/02/2020.   His most recent Eligard was on 12/03/2020.   Since initiating ADT, his PSA has been stable at 0.02.  Remains there today.  He denies any urinary symptoms.  Has been doing well.  No dysuria or gross hematuria.  He is still having some night sweats but this seems to be decreasing in frequency.  PMH: Past Medical History:  Diagnosis Date   Allergy    BPH (benign prostatic hypertrophy)    Diabetes mellitus    ED (erectile dysfunction)    History of prostate cancer    Hx of adenomatous colonic polyps    Hyperlipidemia    Hypertension    Osteoarthritis     Surgical History: Past Surgical History:  Procedure Laterality Date   APPENDECTOMY  1956   FOOT FRACTURE SURGERY  1996   KNEE ARTHROPLASTY Right 03/06/2016   Procedure: COMPUTER ASSISTED TOTAL KNEE ARTHROPLASTY;  Surgeon: Dereck Leep, MD;  Location: [facility];  Service: Orthopedics;  Laterality: Right;    KNEE ARTHROSCOPY  11/2005   right knee   PROSTATE SURGERY  01/2009   I-131 implants   SINUS SURGERY WITH INSTATRAK  09/2007    Home Medications:  Allergies as of 12/27/2021       Reactions   Meloxicam Other (See Comments)   Cramps at night   Metformin    REACTION: diarrhea        Medication List        Accurate as of December 27, 2021 12:12 PM. If you have any questions, ask your nurse or doctor.          amLODipine 10 MG tablet Commonly known as: NORVASC TAKE 1 TABLET BY MOUTH EVERY DAY   aspirin EC 81 MG tablet Take 81 mg by mouth daily.   diclofenac Sodium 1 % Gel Commonly known as: VOLTAREN Apply topically 4 (four) times daily.   Klor-Con M20 20 MEQ tablet Generic drug: potassium chloride SA TAKE 1 TABLET BY MOUTH EVERY DAY   lisinopril-hydrochlorothiazide 20-12.5 MG tablet Commonly known as: ZESTORETIC TAKE 2 TABLETS BY MOUTH EVERY DAY   LORazepam 1 MG tablet Commonly known as: ATIVAN TAKE 1 TO 2 TABLETS BY MOUTH AT BEDTIME AS NEEDED FOR SLEEP   pravastatin 20 MG tablet Commonly known as: PRAVACHOL TAKE 1 TABLET BY MOUTH EVERY DAY   timolol 0.5 % ophthalmic solution Commonly known as: TIMOPTIC Place 1 drop into the  right eye 2 (two) times daily.        Allergies:  Allergies  Allergen Reactions   Meloxicam Other (See Comments)    Cramps at night   Metformin     REACTION: diarrhea    Family History: No family history on file.  Social History:  reports that he quit smoking about 10 years ago. His smoking use included cigarettes. He has a 27.50 pack-year smoking history. He has never been exposed to tobacco smoke. He has never used smokeless tobacco. He reports that he does not drink alcohol and does not use drugs.   Physical Exam: BP (!) 147/78   Pulse 89   Ht '6\' 1"'$  (1.854 m)   Wt 219 lb (99.3 kg)   BMI 28.89 kg/m   Constitutional:  Alert and oriented, No acute distress. HEENT: [place] AT, moist mucus membranes.  Trachea midline, no  masses. Neurologic: Grossly intact, no focal deficits, moving all 4 extremities. Psychiatric: Normal mood and affect.  Laboratory Data: Lab Results  Component Value Date   WBC 4.0 02/04/2021   HGB 14.6 02/04/2021   HCT 42.6 02/04/2021   MCV 97.3 02/04/2021   PLT 162.0 02/04/2021    Lab Results  Component Value Date   CREATININE 1.04 02/04/2021    Lab Results  Component Value Date   PSA 0.67 04/01/2012    Lab Results  Component Value Date   HGBA1C 7.7 (A) 11/03/2021    Assessment & Plan:    1. Prostate cancer ([place]) Recurrent prostate cancer, oligometastatic disease with salvage IMRT and 6 months of ADT  PSA remains quite low at 0.02  We will continue to check his PSA on a fairly frequent basis, at least every 6 months  He is agreeable this plan.  He is otherwise asymptomatic.  Hot flashes are resolved in the near future. - PSA; Future - PSA   Return for PSA lab only 6 mo, PSA/ MD visit in 12 months.  Hollice Espy, MD  [facility] [address], [place], [place] [address] [phone]

## 2021-12-27 NOTE — Addendum Note (Signed)
Addended by: Amado Coe on: 12/27/2021 04:52 PM   Modules accepted: Orders

## 2022-01-02 DIAGNOSIS — H348312 Tributary (branch) retinal vein occlusion, right eye, stable: Secondary | ICD-10-CM | POA: Diagnosis not present

## 2022-01-06 ENCOUNTER — Other Ambulatory Visit: Payer: Self-pay | Admitting: Internal Medicine

## 2022-01-06 NOTE — Telephone Encounter (Signed)
Last filled 12-12-21 # 60 Last OV 12-07-21 Next OV 02-16-22 [facility] [address]

## 2022-01-24 ENCOUNTER — Ambulatory Visit (INDEPENDENT_AMBULATORY_CARE_PROVIDER_SITE_OTHER): Payer: Medicare HMO | Admitting: Internal Medicine

## 2022-01-24 ENCOUNTER — Encounter: Payer: Self-pay | Admitting: Internal Medicine

## 2022-01-24 ENCOUNTER — Ambulatory Visit: Payer: Medicare HMO | Admitting: Internal Medicine

## 2022-01-24 VITALS — BP 116/70 | HR 83 | Temp 97.4°F | Ht 73.0 in | Wt 218.0 lb

## 2022-01-24 DIAGNOSIS — M25562 Pain in left knee: Secondary | ICD-10-CM | POA: Diagnosis not present

## 2022-01-24 DIAGNOSIS — M1712 Unilateral primary osteoarthritis, left knee: Secondary | ICD-10-CM | POA: Diagnosis not present

## 2022-01-24 NOTE — Assessment & Plan Note (Signed)
Severe No signs of infection or gout No ligament findings but could have torn meniscus Will set up today with [facility] at the urgent clinic Can use the aleve prn for now

## 2022-01-24 NOTE — Progress Notes (Signed)
Subjective:    Patient ID: Rodney Suarez, male    DOB: [birth year], 81 y.o.   MRN: [id number]  HPI Here due to left knee pain  Having left knee problems Suddenly having severe pain---"near crying" Started a couple of days ago---no injury or changes (some typical yard work)  Has been swelling Tried brace Taking aleve---some better Not warm  Current Outpatient Medications on File Prior to Visit  Medication Sig Dispense Refill   amLODipine (NORVASC) 10 MG tablet TAKE 1 TABLET BY MOUTH EVERY DAY 90 tablet 3   aspirin EC 81 MG tablet Take 81 mg by mouth daily.     diclofenac Sodium (VOLTAREN) 1 % GEL Apply topically 4 (four) times daily.     KLOR-CON M20 20 MEQ tablet TAKE 1 TABLET BY MOUTH EVERY DAY 90 tablet 3   lisinopril-hydrochlorothiazide (ZESTORETIC) 20-12.5 MG tablet TAKE 2 TABLETS BY MOUTH EVERY DAY 180 tablet 3   LORazepam (ATIVAN) 0.5 MG tablet TAKE 2 TO 4 TABLETS BY MOUTH AT BEDTIME AS NEEDED FOR SLEEP 120 tablet 0   pravastatin (PRAVACHOL) 20 MG tablet TAKE 1 TABLET BY MOUTH EVERY DAY 90 tablet 1   timolol (TIMOPTIC) 0.5 % ophthalmic solution Place 1 drop into the right eye 2 (two) times daily.     No current facility-administered medications on file prior to visit.    Allergies  Allergen Reactions   Meloxicam Other (See Comments)    Cramps at night   Metformin     REACTION: diarrhea    Past Medical History:  Diagnosis Date   Allergy    BPH (benign prostatic hypertrophy)    Diabetes mellitus    ED (erectile dysfunction)    History of prostate cancer    Hx of adenomatous colonic polyps    Hyperlipidemia    Hypertension    Osteoarthritis     Past Surgical History:  Procedure Laterality Date   APPENDECTOMY  1956   FOOT FRACTURE SURGERY  1996   KNEE ARTHROPLASTY Right 03/06/2016   Procedure: COMPUTER ASSISTED TOTAL KNEE ARTHROPLASTY;  Surgeon: Dereck Leep, MD;  Location: [facility] OR[facility];  Service: Orthopedics;  Laterality: Right;   KNEE ARTHROSCOPY  11/2005    right knee   PROSTATE SURGERY  01/2009   I-131 implants   SINUS SURGERY WITH INSTATRAK  09/2007    History reviewed. No pertinent family history.  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  Tobacco Use   Smoking status: Former    Packs/day: 0.50    Years: 55.00    Total pack years: 27.50    Types: Cigarettes    Quit date: 04/04/2011    Years since quitting: 10.8    Passive exposure: Never   Smokeless tobacco: Never  Vaping Use   Vaping Use: Never used  Substance and Sexual Activity   Alcohol use: No   Drug use: No   Sexual activity: Not on file  Other Topics Concern   Not on file  Social History Narrative   No living will   Wife and daughter should be health care POAs   Discussed DNR--he requests this (done 10/14/12)   No tube feedings if cognitively unaware   Social  Determinants of Health   Financial Resource Strain: Not on file  Food Insecurity: Not on file  Transportation Needs: Not on file  Physical Activity: Not on file  Stress: Not on file  Social Connections: Not on file  Intimate Partner Violence: Not on file   Review of Systems No history of gout No fever    Objective:   Physical Exam Musculoskeletal:     Comments: Mild swelling/effusion on left No warmth or redness Tenderness along lateral side--but no ligament findings MacMurray's ??positive   Neurological:     Comments: Antalgic gait            Assessment & Plan:

## 2022-01-27 ENCOUNTER — Other Ambulatory Visit: Payer: Self-pay | Admitting: Internal Medicine

## 2022-02-03 DIAGNOSIS — H401134 Primary open-angle glaucoma, bilateral, indeterminate stage: Secondary | ICD-10-CM | POA: Diagnosis not present

## 2022-02-03 LAB — HM DIABETES EYE EXAM

## 2022-02-13 DIAGNOSIS — H401134 Primary open-angle glaucoma, bilateral, indeterminate stage: Secondary | ICD-10-CM | POA: Diagnosis not present

## 2022-02-15 ENCOUNTER — Encounter: Payer: Medicare HMO | Admitting: Internal Medicine

## 2022-02-16 ENCOUNTER — Other Ambulatory Visit: Payer: Self-pay | Admitting: Internal Medicine

## 2022-02-16 ENCOUNTER — Ambulatory Visit (INDEPENDENT_AMBULATORY_CARE_PROVIDER_SITE_OTHER): Payer: Medicare HMO | Admitting: Internal Medicine

## 2022-02-16 ENCOUNTER — Encounter: Payer: Self-pay | Admitting: Internal Medicine

## 2022-02-16 VITALS — BP 106/74 | HR 70 | Temp 97.6°F | Ht 71.0 in | Wt 219.0 lb

## 2022-02-16 DIAGNOSIS — R413 Other amnesia: Secondary | ICD-10-CM

## 2022-02-16 DIAGNOSIS — E1159 Type 2 diabetes mellitus with other circulatory complications: Secondary | ICD-10-CM

## 2022-02-16 DIAGNOSIS — I10 Essential (primary) hypertension: Secondary | ICD-10-CM

## 2022-02-16 DIAGNOSIS — E538 Deficiency of other specified B group vitamins: Secondary | ICD-10-CM

## 2022-02-16 DIAGNOSIS — Z0000 Encounter for general adult medical examination without abnormal findings: Secondary | ICD-10-CM

## 2022-02-16 LAB — LIPID PANEL
Cholesterol: 178 mg/dL (ref 0–200)
HDL: 56.4 mg/dL (ref >39.00)
LDL Cholesterol: 104 mg/dL — ABNORMAL HIGH (ref 0–99)
NonHDL: 121.43
Total CHOL/HDL Ratio: 3
Triglycerides: 88 mg/dL (ref 0.0–149.0)
VLDL: 17.6 mg/dL (ref 0.0–40.0)

## 2022-02-16 LAB — COMPREHENSIVE METABOLIC PANEL
ALT: 18 U/L (ref 0–53)
AST: 24 U/L (ref 0–37)
Albumin: 4.5 g/dL (ref 3.5–5.2)
Alkaline Phosphatase: 59 U/L (ref 39–117)
BUN: 23 mg/dL (ref 6–23)
CO2: 30 mEq/L (ref 19–32)
Calcium: 9.5 mg/dL (ref 8.4–10.5)
Chloride: 100 mEq/L (ref 96–112)
Creatinine, Ser: 1.26 mg/dL (ref 0.40–1.50)
GFR: 52.87 mL/min — ABNORMAL LOW (ref >60.00)
Glucose, Bld: 176 mg/dL — ABNORMAL HIGH (ref 70–99)
Potassium: 4.1 mEq/L (ref 3.5–5.1)
Sodium: 138 mEq/L (ref 135–145)
Total Bilirubin: 1.1 mg/dL (ref 0.2–1.2)
Total Protein: 7.4 g/dL (ref 6.0–8.3)

## 2022-02-16 LAB — TSH: TSH: 2 u[IU]/mL (ref 0.35–5.50)

## 2022-02-16 LAB — CBC
HCT: 39.4 % (ref 39.0–52.0)
Hemoglobin: 13.4 g/dL (ref 13.0–17.0)
MCHC: 34 g/dL (ref 30.0–36.0)
MCV: 97.3 fl (ref 78.0–100.0)
Platelets: 187 10*3/uL (ref 150.0–400.0)
RBC: 4.05 Mil/uL — ABNORMAL LOW (ref 4.22–5.81)
RDW: 14.4 % (ref 11.5–15.5)
WBC: 3.5 10*3/uL — ABNORMAL LOW (ref 4.0–10.5)

## 2022-02-16 LAB — HEMOGLOBIN A1C: Hgb A1c MFr Bld: 7.8 % — ABNORMAL HIGH (ref 4.6–6.5)

## 2022-02-16 LAB — VITAMIN B12: Vitamin B-12: 199 pg/mL — ABNORMAL LOW (ref 211–911)

## 2022-02-16 LAB — HM DIABETES FOOT EXAM

## 2022-02-16 NOTE — Progress Notes (Signed)
Subjective:    Patient ID: Rodney Suarez, male    DOB: 05-12-1940, 81 y.o.   MRN: [id number]  HPI Here for Medicare wellness visit and follow up of chronic health conditions Reviewed advanced directives Reviewed other doctors----[facility], Dr Zachery Dakins, Dr Karie Kirks, Dr Novella Olive, Dr Chrystal--radiation oncology, dentist? Only surgery was excision of cyst from neck. No hospitalizations this year Vision is fine. Hearing is good No alcohol or tobacco Regular exercise No falls No depression or anhedonia Independent with instrumental ADLs Some recall issues---no functional memory issues  Did see Dr Patsey Berthold Due for CT scan but hasn't scheduled it Still with some chest wall pain---can be "really tough" Now more on right Often no pain No SOB or cough  Checks sugars regularly Usually 90's to 100 generally Careful with eating Weight stable No foot numbness or burning  No palpitations No dizziness or syncope No edema  Current Outpatient Medications on File Prior to Visit  Medication Sig Dispense Refill   amLODipine (NORVASC) 10 MG tablet TAKE 1 TABLET BY MOUTH EVERY DAY 90 tablet 3   aspirin EC 81 MG tablet Take 81 mg by mouth daily.     diclofenac Sodium (VOLTAREN) 1 % GEL Apply topically 4 (four) times daily.     KLOR-CON M20 20 MEQ tablet TAKE 1 TABLET BY MOUTH EVERY DAY 90 tablet 3   lisinopril-hydrochlorothiazide (ZESTORETIC) 20-12.5 MG tablet TAKE 2 TABLETS BY MOUTH EVERY DAY 180 tablet 0   LORazepam (ATIVAN) 0.5 MG tablet TAKE 2 TO 4 TABLETS BY MOUTH AT BEDTIME AS NEEDED FOR SLEEP 120 tablet 0   pravastatin (PRAVACHOL) 20 MG tablet TAKE 1 TABLET BY MOUTH EVERY DAY 90 tablet 1   timolol (TIMOPTIC) 0.5 % ophthalmic solution Place 1 drop into the right eye 2 (two) times daily.     No current facility-administered medications on file prior to visit.    Allergies  Allergen Reactions   Meloxicam Other (See Comments)    Cramps at night    Metformin     REACTION: diarrhea    Past Medical History:  Diagnosis Date   Allergy    BPH (benign prostatic hypertrophy)    Diabetes mellitus    ED (erectile dysfunction)    History of prostate cancer    Hx of adenomatous colonic polyps    Hyperlipidemia    Hypertension    Osteoarthritis     Past Surgical History:  Procedure Laterality Date   APPENDECTOMY  1956   FOOT FRACTURE SURGERY  1996   KNEE ARTHROPLASTY Right 03/06/2016   Procedure: COMPUTER ASSISTED TOTAL KNEE ARTHROPLASTY;  Surgeon: Dereck Leep, MD;  Location: [facility] ORS;  Service: Orthopedics;  Laterality: Right;   KNEE ARTHROSCOPY  11/2005   right knee   PROSTATE SURGERY  01/2009   I-131 implants   SINUS SURGERY WITH INSTATRAK  09/2007    History reviewed. No pertinent family history.  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  Tobacco Use   Smoking status: Former    Packs/day: 0.50    Years: 55.00    Total pack years: 27.50    Types: Cigarettes    Quit date: 04/04/2011    Years since quitting:  10.8    Passive exposure: Never   Smokeless tobacco: Never  Vaping Use   Vaping Use: Never used  Substance and Sexual Activity   Alcohol use: No   Drug use: No   Sexual activity: Not on file  Other Topics Concern   Not on file  Social History Narrative   No living will   Wife and daughter should be health care POAs   Discussed DNR--he requests this (done 10/14/12)   No tube feedings if cognitively unaware   Social Determinants of Health   Financial Resource Strain: Not on file  Food Insecurity: Not on file  Transportation Needs: Not on file  Physical Activity: Not on file  Stress: Not on file  Social Connections: Not on file  Intimate Partner  Violence: Not on file   Review of Systems Sleeps well Wears seat belt Teeth okay---keeps up with dentist No suspicious skin lesions--no dermatologist No heartburn or dysphagia Bowels move fine--no blood Some nosebleeds after sneeze a couple of months ago--not recently Voids okay. Stream is okay No sig back or joint pains     Objective:   Physical Exam Constitutional:      Appearance: Normal appearance.  HENT:     Mouth/Throat:     Comments: No lesions Eyes:     Conjunctiva/sclera: Conjunctivae normal.     Pupils: Pupils are equal, round, and reactive to light.  Cardiovascular:     Rate and Rhythm: Normal rate and regular rhythm.     Pulses: Normal pulses.     Heart sounds: No murmur heard.    No gallop.  Pulmonary:     Effort: Pulmonary effort is normal.     Breath sounds: Normal breath sounds. No wheezing or rales.  Abdominal:     Palpations: Abdomen is soft.     Tenderness: There is no abdominal tenderness.  Musculoskeletal:     Cervical back: Neck supple.     Right lower leg: No edema.     Left lower leg: No edema.  Lymphadenopathy:     Cervical: No cervical adenopathy.  Skin:    Findings: No lesion or rash.     Comments: No foot lesions  Neurological:     General: No focal deficit present.     Mental Status: He is alert and oriented to person, place, and time.     Comments: Mini-cog-- clock okay (arms slightly off). Recall 0/3 Normal sensation in feet  Psychiatric:        Mood and Affect: Mood normal.        Behavior: Behavior normal.            Assessment & Plan:

## 2022-02-16 NOTE — Progress Notes (Signed)
Hearing Screening - Comments:: Passed whisper test Vision Screening - Comments:: January 2023  

## 2022-02-16 NOTE — Assessment & Plan Note (Signed)
Hopefully still acceptable control without meds Would consider jardiance/farxiga if over 8% HTN treated

## 2022-02-16 NOTE — Patient Instructions (Addendum)
Please check with Dr Domingo Dimes office about getting the chest CT scan scheduled. I recommend the new COVID vaccine, a tetanus booster and the shingrix (shingles) vaccine---all these at the pharmacy.

## 2022-02-16 NOTE — Assessment & Plan Note (Signed)
Seems to have mild stable issues Asked him to have wife monitor Check labs

## 2022-02-16 NOTE — Assessment & Plan Note (Signed)
I have personally reviewed the Medicare Annual Wellness questionnaire and have noted 1. The patient's medical and social history 2. Their use of alcohol, tobacco or illicit drugs 3. Their current medications and supplements 4. The patient's functional ability including ADL's, fall risks, home safety risks and hearing or visual             impairment. 5. Diet and physical activities 6. Evidence for depression or mood disorders  The patients weight, height, BMI and visual acuity have been recorded in the chart I have made referrals, counseling and provided education to the patient based review of the above and I have provided the pt with a written personalized care plan for preventive services.  I have provided you with a copy of your personalized plan for preventive services. Please take the time to review along with your updated medication list.  Done with cancer screening Flu vaccine today New COVID, Td and shingrix at pharmacy Exercises regularly

## 2022-02-16 NOTE — Assessment & Plan Note (Signed)
BP Readings from Last 3 Encounters:  02/16/22 106/74  01/24/22 116/70  12/27/21 (!) 147/78   Good control on lisinopril/HCTZ 40/25 Will check labs

## 2022-02-16 NOTE — Addendum Note (Signed)
Addended by: Tammi Sou on: 02/16/2022 09:29 AM   Modules accepted: Orders

## 2022-02-20 ENCOUNTER — Encounter: Payer: Self-pay | Admitting: Pulmonary Disease

## 2022-02-20 ENCOUNTER — Ambulatory Visit (INDEPENDENT_AMBULATORY_CARE_PROVIDER_SITE_OTHER): Payer: Medicare HMO | Admitting: Pulmonary Disease

## 2022-02-20 ENCOUNTER — Ambulatory Visit
Admission: RE | Admit: 2022-02-20 | Discharge: 2022-02-20 | Disposition: A | Discharge status: Home / Self Care | Payer: Medicare HMO | Source: Ambulatory Visit | Attending: Pulmonary Disease | Admitting: Pulmonary Disease

## 2022-02-20 VITALS — BP 132/86 | HR 77 | Temp 98.1°F | Ht 73.0 in | Wt 218.2 lb

## 2022-02-20 DIAGNOSIS — Z8546 Personal history of malignant neoplasm of prostate: Secondary | ICD-10-CM | POA: Diagnosis not present

## 2022-02-20 DIAGNOSIS — R918 Other nonspecific abnormal finding of lung field: Secondary | ICD-10-CM | POA: Diagnosis not present

## 2022-02-20 DIAGNOSIS — R911 Solitary pulmonary nodule: Secondary | ICD-10-CM | POA: Diagnosis not present

## 2022-02-20 DIAGNOSIS — J90 Pleural effusion, not elsewhere classified: Secondary | ICD-10-CM | POA: Diagnosis not present

## 2022-02-20 LAB — MICROALBUMIN / CREATININE URINE RATIO
Creatinine,U: 121.7 mg/dL
Microalb Creat Ratio: 0.9 mg/g (ref 0.0–30.0)
Microalb, Ur: 1.1 mg/dL (ref 0.0–1.9)

## 2022-02-20 NOTE — Progress Notes (Signed)
Subjective:    Patient ID: Rodney Suarez, male    DOB: 1940/09/19, 81 y.o.   MRN: [id number] Patient Care Team: Venia Carbon, MD as PCP - General Tyler Pita, MD as Consulting Physician (Pulmonary Disease)  Chief Complaint  Patient presents with   Follow-up    CXR 12/2021-no current sx.     HPI This is a very pleasant 81 year old former smoker (10 PY) history as noted below, who presents for follow-up of multiple lung nodules on the right base on chest x-ray.  Patient was initially evaluated on 20 December 2021 for the details of that visit please refer to the note.  He presents today for a scheduled appointment.  Recall that on 6 September patient had a chest x-ray as part of the investigation of atypical chest pain.  Chest x-ray showed small right lower lobe pulmonary nodules that could be due to infectious or inflammatory etiology.  As part of that investigation a CT chest was ordered however the patient missed the appointment for the CT and presents today with no follow-up imaging.  His initial visit, patient has not had any fevers, chills or sweats.  No cough or sputum production.  No orthopnea or paroxysmal nocturnal dyspnea.  Previously noted chest wall pain has resolved.  He has not had any dyspnea or wheezing.  No lower extremity edema nor calf tenderness.  He does not endorse any other symptomatology.  Overall he feels well and looks well.   Review of Systems A 10 point review of systems was performed and it is as noted above otherwise negative.  Patient Active Problem List   Diagnosis Date Noted   Prostate cancer ([place]) 02/04/2021   Left knee pain 09/26/2019   Sleep disturbance 06/17/2019   Memory loss 04/10/2018   Left-sided chest wall pain 10/12/2016   S/P total knee arthroplasty 03/06/2016   Advance directive discussed with patient 02/10/2016   Primary osteoarthritis of right knee 12/31/2015   Routine general medical examination at a health care facility  12/28/2010   OSTEOARTHRITIS 12/22/2009   PROSTATE CANCER, HX OF 12/22/2008   Hyperlipemia 10/08/2007   BENIGN PROSTATIC HYPERTROPHY 10/08/2007   ADENOMATOUS COLONIC POLYP 03/07/2007   Type 2 diabetes mellitus with other circulatory complications ([place]) 85/88/5027   Essential hypertension, benign 03/07/2007   ALLERGIC RHINITIS 03/07/2007   Social History   Tobacco Use   Smoking status: Former    Packs/day: 0.50    Years: 55.00    Total pack years: 27.50    Types: Cigarettes    Quit date: 04/04/2011    Years since quitting: 10.8    Passive exposure: Never   Smokeless tobacco: Never  Substance Use Topics   Alcohol use: No   Allergies  Allergen Reactions   Meloxicam Other (See Comments)    Cramps at night   Metformin     REACTION: diarrhea   Current Meds  Medication Sig   amLODipine (NORVASC) 10 MG tablet TAKE 1 TABLET BY MOUTH EVERY DAY   aspirin EC 81 MG tablet Take 81 mg by mouth daily.   diclofenac Sodium (VOLTAREN) 1 % GEL Apply topically 4 (four) times daily.   KLOR-CON M20 20 MEQ tablet TAKE 1 TABLET BY MOUTH EVERY DAY   lisinopril-hydrochlorothiazide (ZESTORETIC) 20-12.5 MG tablet TAKE 2 TABLETS BY MOUTH EVERY DAY   LORazepam (ATIVAN) 0.5 MG tablet TAKE 2 TO 4 TABLETS BY MOUTH AT BEDTIME AS NEEDED FOR SLEEP   pravastatin (PRAVACHOL) 20 MG tablet TAKE 1 TABLET  BY MOUTH EVERY DAY   timolol (TIMOPTIC) 0.5 % ophthalmic solution Place 1 drop into the right eye 2 (two) times daily.   Immunization History  Administered Date(s) Administered   Fluad Quad(high Dose 65+) 02/04/2021   H1N1 04/17/2008   Influenza Inj Mdck Quad With Preservative 01/30/2018   Influenza Split 12/28/2010, 02/07/2012   Influenza Whole 02/02/1999, 12/30/2008, 12/22/2009   Influenza,inj,Quad PF,6+ Mos 01/23/2013, 12/31/2013, 01/15/2015, 02/02/2016, 03/20/2017, 01/21/2020   Influenza-Unspecified 11/18/2018   PFIZER(Purple Top)SARS-COV-2 Vaccination 05/23/2019, 06/13/2019   Pneumococcal Conjugate-13  01/15/2015   Pneumococcal Polysaccharide-23 10/08/2007, 03/20/2017   Td 05/01/1996, 10/08/2007      Objective:   Physical Exam BP 132/86 (BP Location: Left Arm, Cuff Size: Normal)   Pulse 77   Temp 98.1 F (36.7 C) (Oral)   Ht '6\' 1"'$  (1.854 m)   Wt 218 lb 3.2 oz (99 kg)   SpO2 97%   BMI 28.79 kg/m  GENERAL: Well-developed, well-nourished gentleman, no acute distress, no conversational dyspnea.  Awake, alert fully oriented.  Fully ambulatory. HEAD: Normocephalic, atraumatic.  EYES: Pupils equal, round, reactive to light.  No scleral icterus.  MOUTH: Poor dentition.  Oral mucosa moist. NECK: Supple. No thyromegaly. Trachea midline. No JVD.  No adenopathy. PULMONARY: Good air entry bilaterally.  No adventitious sounds. CARDIOVASCULAR: S1 and S2.  Regular rate and rhythm with frequent extrasystoles.   ABDOMEN: Benign. MUSCULOSKELETAL: No joint deformity, no clubbing, no edema.  NEUROLOGIC: No overt focal deficit, no gait disturbance, speech is fluent. SKIN: Intact,warm,dry. PSYCH: Mood and behavior normal.    Assessment & Plan:     ICD-10-CM   1. Abnormal chest x-ray with multiple lung nodules  R91.8 DG Chest 2 View   Patient did not get CT chest performed as ordered Will get chest x-ray PA and lateral today If persistent changes, will reorder CT chest    2. History of prostate cancer  Z85.46    This issue adds complexity to his management     Orders Placed This Encounter  Procedures   DG Chest 2 View    Standing Status:   Future    Number of Occurrences:   1    Standing Expiration Date:   02/21/2023    Order Specific Question:   Reason for Exam (SYMPTOM  OR DIAGNOSIS REQUIRED)    Answer:   Lung Nodules    Order Specific Question:   Preferred imaging location?    Answer:   [facility]   We will review chest x-ray when this is performed.  If there is persistent concern of nodules we will proceed with ordering a CT scan.  Otherwise we will see the patient in  follow-up in 3 months time or as needed.  Renold Don, MD Advanced Bronchoscopy PCCM [facility]    *This note was dictated using voice recognition software/Dragon.  Despite best efforts to proofread, errors can occur which can change the meaning. Any transcriptional errors that result from this process are unintentional and may not be fully corrected at the time of dictation.

## 2022-02-20 NOTE — Patient Instructions (Signed)
We are going to get a chest x-ray today to follow-up on your prior film.  If there is anything of concern on the chest x-ray then we will order CT scan.  We will see him in follow-up in 3 months time or as needed.

## 2022-02-22 ENCOUNTER — Telehealth: Payer: Self-pay

## 2022-02-22 NOTE — Telephone Encounter (Signed)
-----   Message from Venia Carbon, MD sent at 02/16/2022  4:35 PM EST ----- Please call His labs are generally okay---the diabetes control still acceptable without meds and some minor abnormalities don't require action. The only concern is the low B12 --that could be causing some of his memory issues. Have him start 500 mcg under the tongue daily--and set up repeat in 1 month (if still low, will need shots). Mail him a copy

## 2022-02-22 NOTE — Telephone Encounter (Signed)
Called pt back as a lab visit was not scheduled. He was not made aware of the B12 he needed to start. I advised him what he needed to get and made him a lab visit for 03-31-22.

## 2022-02-22 NOTE — Telephone Encounter (Signed)
Tried to call pt about lab results. No answer and no VM to leave a message.

## 2022-02-22 NOTE — Telephone Encounter (Signed)
Pt called returning Shannon's call. Told pt [place]'s response regarding lab result, pt had no questions or concerns. Call back # [phone]

## 2022-03-07 ENCOUNTER — Ambulatory Visit: Payer: Medicare HMO | Admitting: Internal Medicine

## 2022-03-08 DIAGNOSIS — M1712 Unilateral primary osteoarthritis, left knee: Secondary | ICD-10-CM | POA: Diagnosis not present

## 2022-03-14 DIAGNOSIS — H401134 Primary open-angle glaucoma, bilateral, indeterminate stage: Secondary | ICD-10-CM | POA: Diagnosis not present

## 2022-03-15 ENCOUNTER — Telehealth: Payer: Self-pay | Admitting: Internal Medicine

## 2022-03-15 NOTE — Telephone Encounter (Signed)
  Encourage patient to contact the pharmacy for refills or they can request refills through [facility]  Did the patient contact the pharmacy: No  LAST APPOINTMENT DATE:  02/16/2022  NEXT APPOINTMENT DATE: N/A   MEDICATION: Ketoconazole  Is the patient out of medication? Yes  PHARMACY:  [id number] [place]   Let patient know to contact pharmacy at the end of the day to make sure medication is ready.  Please notify patient to allow 48-72 hours to process

## 2022-03-16 ENCOUNTER — Telehealth: Payer: Self-pay | Admitting: Internal Medicine

## 2022-03-16 ENCOUNTER — Other Ambulatory Visit: Payer: Self-pay

## 2022-03-16 MED ORDER — KETOCONAZOLE 2 % EX CREA
TOPICAL_CREAM | CUTANEOUS | 1 refills | Status: DC
Start: 2022-03-16 — End: 2022-12-20

## 2022-03-16 NOTE — Telephone Encounter (Signed)
Refill done.  

## 2022-03-16 NOTE — Telephone Encounter (Signed)
Patient called in and stated that he has had 3 nose bleeds today. Sent over to access nurse.

## 2022-03-16 NOTE — Addendum Note (Signed)
Addended by: Viviana Simpler I on: 03/16/2022 05:01 PM   Modules accepted: Orders

## 2022-03-17 NOTE — Telephone Encounter (Signed)
Pt called back and wants appt to see Dr Silvio Pate next wk; explained Dr Silvio Pate was out of office next wk and pt said he would see another "real doctor" at [facility]. Pt does not think that provider at [facility] is real doctor; I advised pt that the providers at [facility] are real doctors, NP or PA. Pt does not have a way to ck BP.. pt said he will go to [facility] this morning; pts wife will drive him. Sending note to Dr Silvio Pate and Silvio Pate pool.

## 2022-03-17 NOTE — Telephone Encounter (Signed)
Patient is scheduled to see Karl Ito on 03/20/22 at 10:20.

## 2022-03-17 NOTE — Telephone Encounter (Addendum)
Pt calling and said that on and off for 3 months pt has had nose bleeds on and off. For the last 3 days pt h as had nosebleeds daily. Pt had nosebleed earlier this morning but no nosebleed now. Pt will go to UC in [place] this morning. UC & ED precautions given and pt voiced understanding. Sending note to Dr Silvio Pate and Silvio Pate pool.

## 2022-03-17 NOTE — Telephone Encounter (Signed)
Okay--that is a reasonable start. I assume the bleeding stopped for now

## 2022-03-17 NOTE — Telephone Encounter (Signed)
[place] Day - Client TELEPHONE ADVICE RECORD [facility] Patient Name: Rodney Suarez Gender: Male DOB: 1940-09-24 Age: 81 Y 3 M 2 D Return Phone Number: [phone] (Primary) Address: City/ State/ Zip: [place]  [address] Client [place] Day - Client Client Site [place] - Day Provider Viviana Simpler- MD Contact Type Call Who Is Calling Patient / Member / Family / Caregiver Call Type Triage / Clinical Relationship To Patient Self Return Phone Number [phone] (Primary) Chief Complaint Nosebleed Reason for Call Symptomatic / Request for [place] states patient has had 3 nose bleeds today. Additional Comment He is covered in blood. He does not want to go to urgent care. The PCP office does not have appointments today or tomorrow. He states he started with issues about a month ago. Translation No Disp. Time Eilene Ghazi Time) Disposition Final User 03/16/2022 5:03:46 PM Attempt made - no message left Rachel Moulds 03/16/2022 5:16:50 PM Attempt made - no message left Rachel Moulds 03/16/2022 5:28:21 PM FINAL ATTEMPT MADE - no message left Yes Joya Gaskins RN, Vonna Kotyk Final Disposition 03/16/2022 5:28:21 PM FINAL ATTEMPT MADE - no message left Yes Joya Gaskins, RN, Vonna Kotyk Comments User: Ellan Lambert, RN Date/Time Eilene Ghazi Time): 03/16/2022 5:04:03 PM VM not set up

## 2022-03-17 NOTE — Telephone Encounter (Signed)
Pt called back; pt said he went to [facility] and was advised if not an emergency pt could not be seen. I called [place] and spoke with Joellen Jersey and she said no available appts but they are seeing walkins. Pt notified and he will go back to [facility]  to be seen.

## 2022-03-20 ENCOUNTER — Other Ambulatory Visit: Payer: Self-pay | Admitting: Internal Medicine

## 2022-03-20 ENCOUNTER — Encounter: Payer: Self-pay | Admitting: Nurse Practitioner

## 2022-03-20 ENCOUNTER — Ambulatory Visit (INDEPENDENT_AMBULATORY_CARE_PROVIDER_SITE_OTHER): Payer: Medicare HMO | Admitting: Nurse Practitioner

## 2022-03-20 VITALS — BP 118/78 | HR 68 | Temp 97.4°F | Ht 71.0 in | Wt 218.0 lb

## 2022-03-20 DIAGNOSIS — J3489 Other specified disorders of nose and nasal sinuses: Secondary | ICD-10-CM | POA: Diagnosis not present

## 2022-03-20 DIAGNOSIS — R040 Epistaxis: Secondary | ICD-10-CM

## 2022-03-20 MED ORDER — MUPIROCIN 2 % EX OINT: 1.0000 | TOPICAL_OINTMENT | Freq: Two times a day (BID) | CUTANEOUS | 0 refills | Status: DC

## 2022-03-20 NOTE — Assessment & Plan Note (Signed)
Recurrent.  Did go over using over-the-counter nasal saline to help keep nasal passages moist with the drier air that were experiencing.  Also went over how to treat a nosebleed at home.  Information given at discharge about same

## 2022-03-20 NOTE — Assessment & Plan Note (Signed)
Will treat for comedone/abscess.  Mupirocin ointment twice daily for a week.  Nontender to palpation no signs of systemic infection or cellulitis.  Follow-up 2 weeks to make sure nasal lesion is resolved if not patient will need ENT referral.  Patient does not have ENT provider

## 2022-03-20 NOTE — Patient Instructions (Addendum)
Nice to see you today I have sent in a ointment to use for a week Also get over the counter nasal saline (like Ocean spray) use it as directed Follow up in two weeks to make sure lesion in nose has resolved

## 2022-03-20 NOTE — Progress Notes (Signed)
Acute Office Visit  Subjective:     Patient ID: Rodney Suarez, male    DOB: July 19, 1940, 81 y.o.   MRN: [id number]  Chief Complaint  Patient presents with   Epistaxis    Has not had one in a couple of days    HPI Patient is in today for nose bleeds  History of HTN, prostate cancer, HLD. He sent in a message and was seen at [facility] and was not satisfied with that. Dr. Silvio Pate mentioned to use a qtip and apply Vaseline or neosporin to the nose.   States that they started approx 1 month ago and states it got worse. States that the nose felt stuffy and he would blow his nose and would be lots of blood.   Does have a history of nosebleed as a kid but that was with nose injury States that he has had approx 6 and she would stay at home and get it to stop   States that he has a cuff at home and check his blood pressure at home and it has been normal  He is here today for same.  Patient has not had a nosebleed in the past couple days.  States he has not used any over-the-counter nasal sprays either.  Denies any trauma/injury to the nose.  Denies had sticking anything up his nose.  Of note he is on a baby aspirin daily.  Each time he has been able to get the nosebleed to stop while being at home.  Patient feels like this both sided not just one-sided.   Review of Systems  Constitutional:  Negative for chills and fever.  HENT:  Negative for ear discharge, ear pain and sore throat.   Respiratory:  Negative for cough.   Skin:        "+" epistaxis        Objective:    BP 118/78 (BP Location: Left Arm, Patient Position: Sitting)   Pulse 68   Temp (!) 97.4 F (36.3 C) (Skin)   Ht '5\' 11"'$  (1.803 m)   Wt 218 lb (98.9 kg)   SpO2 98%   BMI 30.40 kg/m    Physical Exam HENT:     Right Ear: Tympanic membrane, ear canal and external ear normal.     Left Ear: Tympanic membrane, ear canal and external ear normal.     Nose:     Comments: Lesion to left lateral interior nostril Papular in  shape. Crust on the top of lesion. Not painful to palpation  Cardiovascular:     Rate and Rhythm: Normal rate and regular rhythm.     Heart sounds: Normal heart sounds.  Pulmonary:     Effort: Pulmonary effort is normal.     Breath sounds: Normal breath sounds.     No results found for any visits on 03/20/22.      Assessment & Plan:   Problem List Items Addressed This Visit       Other   Epistaxis - Primary    Recurrent.  Did go over using over-the-counter nasal saline to help keep nasal passages moist with the drier air that were experiencing.  Also went over how to treat a nosebleed at home.  Information given at discharge about same      Nasal lesion    Will treat for comedone/abscess.  Mupirocin ointment twice daily for a week.  Nontender to palpation no signs of systemic infection or cellulitis.  Follow-up 2 weeks to make sure nasal  lesion is resolved if not patient will need ENT referral.  Patient does not have ENT provider      Relevant Medications   mupirocin ointment (BACTROBAN) 2 %    Meds ordered this encounter  Medications   mupirocin ointment (BACTROBAN) 2 %    Sig: Apply 1 Application topically 2 (two) times daily. For a week. In the nose    Dispense:  15 g    Refill:  0    Order Specific Question:   Supervising Provider    Answer:   Loura Pardon A [[id number]]    Return in about 2 weeks (around 04/03/2022) for Nasal lesion check. With me or Dr. Silvio Pate .  Romilda Garret, NP

## 2022-03-20 NOTE — Telephone Encounter (Signed)
Seen and evaluated in office

## 2022-03-20 NOTE — Telephone Encounter (Signed)
[place] Night - Client Nonclinical Telephone Record  [facility] Client Rodney Suarez Primary [facility] Night - Client Client Site [place] Provider Viviana Simpler- MD Contact Type Call Who Is Calling Patient / Member / Family / Caregiver Caller Name [place] Phone Number [phone] Patient Name Rodney Suarez Patient DOB [birth year] Call Type Message Only Information Provided Reason for Call Request for General Office Information Initial Comment Caller states he has an appointment tomorrow but need to know what time. Additional Comment Office hours provided. Disp. Time Disposition Final User 03/19/2022 11:52:22 AM General Information Provided Yes Tindall, Ashely Call Closed By: Maryelizabeth Kaufmann Transaction Date/Time: 03/19/2022 11:49:44 AM (ET   Per chart review tab pt has already been seen today by Romilda Garret NP.

## 2022-03-20 NOTE — Telephone Encounter (Signed)
Unable to reach pt by phone for update no answer on home phone and no answer on cell and no V/M set up. Pt still has appt to see Romilda Garret NP 03/20/22 at 10;20. Sending note to Romilda Garret NP and [place] pool.sending teams to [facility] LPN who is working with Romilda Garret NP today.

## 2022-03-20 NOTE — Telephone Encounter (Signed)
Patient saw Romilda Garret NP today 03/20/22.

## 2022-03-21 NOTE — Telephone Encounter (Signed)
Last filled 01-06-22 # 60 Last OV 02-16-22 Next OV 04-04-22 [facility] [address]

## 2022-03-26 ENCOUNTER — Other Ambulatory Visit: Payer: Self-pay | Admitting: Internal Medicine

## 2022-03-31 ENCOUNTER — Other Ambulatory Visit (INDEPENDENT_AMBULATORY_CARE_PROVIDER_SITE_OTHER): Payer: Medicare HMO

## 2022-03-31 DIAGNOSIS — E538 Deficiency of other specified B group vitamins: Secondary | ICD-10-CM | POA: Diagnosis not present

## 2022-03-31 LAB — VITAMIN B12: Vitamin B-12: 724 pg/mL (ref 211–911)

## 2022-04-04 ENCOUNTER — Ambulatory Visit: Payer: Medicare HMO | Admitting: Internal Medicine

## 2022-04-24 ENCOUNTER — Other Ambulatory Visit: Payer: Self-pay | Admitting: Internal Medicine

## 2022-05-03 ENCOUNTER — Ambulatory Visit: Payer: Medicare HMO | Admitting: Pulmonary Disease

## 2022-05-15 DIAGNOSIS — H209 Unspecified iridocyclitis: Secondary | ICD-10-CM | POA: Diagnosis not present

## 2022-05-15 DIAGNOSIS — H348312 Tributary (branch) retinal vein occlusion, right eye, stable: Secondary | ICD-10-CM | POA: Diagnosis not present

## 2022-05-15 DIAGNOSIS — H34831 Tributary (branch) retinal vein occlusion, right eye, with macular edema: Secondary | ICD-10-CM | POA: Diagnosis not present

## 2022-05-15 DIAGNOSIS — H2513 Age-related nuclear cataract, bilateral: Secondary | ICD-10-CM | POA: Diagnosis not present

## 2022-05-15 DIAGNOSIS — Z961 Presence of intraocular lens: Secondary | ICD-10-CM | POA: Diagnosis not present

## 2022-05-15 DIAGNOSIS — E119 Type 2 diabetes mellitus without complications: Secondary | ICD-10-CM | POA: Diagnosis not present

## 2022-05-15 DIAGNOSIS — H401134 Primary open-angle glaucoma, bilateral, indeterminate stage: Secondary | ICD-10-CM | POA: Diagnosis not present

## 2022-05-18 ENCOUNTER — Inpatient Hospital Stay: Payer: Medicare HMO | Attending: Radiation Oncology

## 2022-05-22 ENCOUNTER — Ambulatory Visit (INDEPENDENT_AMBULATORY_CARE_PROVIDER_SITE_OTHER): Payer: Medicare HMO | Admitting: Internal Medicine

## 2022-05-22 ENCOUNTER — Encounter: Payer: Self-pay | Admitting: Internal Medicine

## 2022-05-22 VITALS — BP 136/64 | HR 81 | Temp 97.7°F | Ht 71.0 in | Wt 219.0 lb

## 2022-05-22 DIAGNOSIS — J3489 Other specified disorders of nose and nasal sinuses: Secondary | ICD-10-CM

## 2022-05-22 DIAGNOSIS — R040 Epistaxis: Secondary | ICD-10-CM

## 2022-05-22 MED ORDER — MUPIROCIN 2 % EX OINT: 1.0000 | TOPICAL_OINTMENT | Freq: Two times a day (BID) | CUTANEOUS | 0 refills | Status: DC

## 2022-05-22 NOTE — Progress Notes (Signed)
Subjective:    Patient ID: Rodney Suarez, male    DOB: March 19, 1941, 82 y.o.   MRN: [id number]  HPI Here due to ongoing nosebleeds  Still having nosebleeds and can be very bad Had been just a few drops--now much more (especially if he blows nose) Had been using the cream on the irritated area by nare  Just coming out of left side No illness  Current Outpatient Medications on File Prior to Visit  Medication Sig Dispense Refill   amLODipine (NORVASC) 10 MG tablet TAKE 1 TABLET BY MOUTH EVERY DAY 90 tablet 3   aspirin EC 81 MG tablet Take 81 mg by mouth daily.     ketoconazole (NIZORAL) 2 % cream APPLY TOPICALLY 2 (TWO) TIMES DAILY AS NEEDED FOR IRRITATION. 60 g 1   KLOR-CON M20 20 MEQ tablet TAKE 1 TABLET BY MOUTH EVERY DAY 90 tablet 3   lisinopril-hydrochlorothiazide (ZESTORETIC) 20-12.5 MG tablet TAKE 2 TABLETS BY MOUTH EVERY DAY 180 tablet 3   LORazepam (ATIVAN) 0.5 MG tablet TAKE 2 TO 4 TABLETS BY MOUTH AT BEDTIME AS NEEDED FOR SLEEP 120 tablet 0   mupirocin ointment (BACTROBAN) 2 % Apply 1 Application topically 2 (two) times daily. For a week. In the nose 15 g 0   pravastatin (PRAVACHOL) 20 MG tablet TAKE 1 TABLET BY MOUTH EVERY DAY 90 tablet 3   timolol (TIMOPTIC) 0.5 % ophthalmic solution Place 1 drop into the right eye 2 (two) times daily.     No current facility-administered medications on file prior to visit.    Allergies  Allergen Reactions   Meloxicam Other (See Comments)    Cramps at night   Metformin     REACTION: diarrhea    Past Medical History:  Diagnosis Date   Allergy    BPH (benign prostatic hypertrophy)    Diabetes mellitus    ED (erectile dysfunction)    History of prostate cancer    Hx of adenomatous colonic polyps    Hyperlipidemia    Hypertension    Osteoarthritis     Past Surgical History:  Procedure Laterality Date   APPENDECTOMY  1956   FOOT FRACTURE SURGERY  1996   KNEE ARTHROPLASTY Right 03/06/2016   Procedure: COMPUTER ASSISTED TOTAL  KNEE ARTHROPLASTY;  Surgeon: Dereck Leep, MD;  Location: [facility] ORS;  Service: Orthopedics;  Laterality: Right;   KNEE ARTHROSCOPY  11/2005   right knee   PROSTATE SURGERY  01/2009   I-131 implants   SINUS SURGERY WITH INSTATRAK  09/2007    History reviewed. No pertinent family history.  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  Tobacco Use   Smoking status: Former    Packs/day: 0.50    Years: 55.00    Total pack years: 27.50    Types: Cigarettes    Quit date: 04/04/2011    Years since quitting: 11.1    Passive exposure: Never   Smokeless tobacco: Never  Vaping Use   Vaping Use: Never used  Substance and Sexual Activity   Alcohol use: No   Drug use: No   Sexual activity: Not on file  Other Topics Concern   Not on file  Social History  Narrative   No living will   Wife and daughter should be health care POAs   Discussed DNR--he requests this (done 10/14/12)   No tube feedings if cognitively unaware   Social Determinants of Health   Financial Resource Strain: Not on file  Food Insecurity: Not on file  Transportation Needs: Not on file  Physical Activity: Not on file  Stress: Not on file  Social Connections: Not on file  Intimate Partner Violence: Not on file   Review of Systems Electric heat--discussed humidifier     Objective:   Physical Exam Constitutional:      Appearance: Normal appearance.  HENT:     Nose:     Comments: No obvious bleeding spots on either side Neurological:     Mental Status: He is alert.            Assessment & Plan:

## 2022-05-22 NOTE — Assessment & Plan Note (Signed)
Having ongoing problems I don't see the spot--I wonder if it might be posterior (but doesn't seem to drain down his throat) Given the increasing severity, will try to get him in with ENT as soon as possible

## 2022-05-24 ENCOUNTER — Ambulatory Visit: Payer: Medicare HMO | Admitting: Radiation Oncology

## 2022-05-26 ENCOUNTER — Other Ambulatory Visit: Payer: Self-pay | Admitting: Family

## 2022-05-26 ENCOUNTER — Other Ambulatory Visit: Payer: Self-pay | Admitting: Internal Medicine

## 2022-05-26 DIAGNOSIS — J3489 Other specified disorders of nose and nasal sinuses: Secondary | ICD-10-CM

## 2022-05-31 ENCOUNTER — Encounter: Payer: Self-pay | Admitting: Ophthalmology

## 2022-06-01 ENCOUNTER — Telehealth: Payer: Self-pay | Admitting: Internal Medicine

## 2022-06-01 NOTE — Telephone Encounter (Signed)
Patient called to follow up, This was sent to wrong provider

## 2022-06-01 NOTE — Telephone Encounter (Signed)
I am glad we were able to facilitate the visit

## 2022-06-01 NOTE — Telephone Encounter (Addendum)
Rodney Carbon, MD  to [address]  Rodney Suarez, Rodney Suarez     06/01/22 12:40 PM  Rodney Suarez---can you push his ENT referral to urgent to try to get him in.  Rodney Suarez, let him know we are working on the ENT referral   Pt has called back and said that he has been having nose bleeds almost daily. Pt said his nose feels stuffy and when he blows nose blood just comes out for about 5 mins. Pt said now it is not just a few drops but a lot of blood. Pt does not have active bleeding now but said about 2 hrs ago had a "bad nosebleed" that last about 6 mins. Pt said he is getting worried and wants to know when he can get ENT referral in [place]. I notified pt of DR Everardo Beals note at 12:40 and pt voiced understanding and wants to know if he can find something out now. I also advised pt to try not to blow nose and should use the mupirocin cream 2 % as instructed by Dr Silvio Pate. I also advised pt about how to hold nose with his index finger and thumb like a clothes pin on his nose and pt voiced understanding.pt  is not having H/A or dizziness.pt said last 2 - 3 days bleeding has been worse.I teams Rodney Suarez and she is looking into the referral. Pt request cb at [phone] ASAP. Sending note to Rodney referral coordinating pool now and [place] pool. Thank you. UC & ED precautions given and pt voiced understanding.    Rodney Suarez teams me and she got pt an appt with ENT tomorrow and she is calling pt now to give pt the details. Pt very appreciative and we ended call so he could speak with Rodney Suarez. FYI to Dr Silvio Pate and Silvio Pate pool.

## 2022-06-01 NOTE — Telephone Encounter (Signed)
Pt is aware of the appt for tomorrow   Nothing further needed.

## 2022-06-01 NOTE — Telephone Encounter (Signed)
Patient called to follow up on nose bleeds, He was seen 05/22/22. He said the cream seemed to help at first but that it has been way worse the past 2 days. He said him and his wife are concerned with how much blood that's has came out. He said its not doing it at the moment but he wanted to know what he needs to do. Please advise. Call back is [phone]

## 2022-06-01 NOTE — Telephone Encounter (Signed)
Pt called stating he's still experiencing bad nose bleeds & needs advice on what to do. Transferred pt to [facility].

## 2022-06-01 NOTE — Telephone Encounter (Signed)
Patient is scheduled with [facility] at the [facility] office 06/02/2022  06/02/2022 Arrive by 2:15 to see Willaim Rayas, PA at the [facility], [place] [address] [place], [place] [address] Phone: [phone]   Since nosebleed is not active and bleeding is not lasting for 67mns or more at a time they do not consider it "urgent" or need for Cauterization today. [place]

## 2022-06-01 NOTE — Telephone Encounter (Signed)
Its because [facility] filled it last in Dr Alla German absence.

## 2022-06-02 DIAGNOSIS — R040 Epistaxis: Secondary | ICD-10-CM | POA: Diagnosis not present

## 2022-06-05 NOTE — Discharge Instructions (Signed)

## 2022-06-07 ENCOUNTER — Other Ambulatory Visit: Payer: Self-pay

## 2022-06-07 ENCOUNTER — Ambulatory Visit: Payer: Medicare HMO | Admitting: Anesthesiology

## 2022-06-07 ENCOUNTER — Encounter: Payer: Self-pay | Admitting: Ophthalmology

## 2022-06-07 ENCOUNTER — Ambulatory Visit
Admission: RE | Admit: 2022-06-07 | Discharge: 2022-06-07 | Disposition: A | Discharge status: Home / Self Care | Payer: Medicare HMO | Attending: Ophthalmology | Admitting: Ophthalmology

## 2022-06-07 ENCOUNTER — Encounter
Admission: RE | Disposition: A | Discharge status: Home / Self Care | Payer: Self-pay | Source: Home / Self Care | Attending: Ophthalmology

## 2022-06-07 DIAGNOSIS — E1136 Type 2 diabetes mellitus with diabetic cataract: Secondary | ICD-10-CM | POA: Insufficient documentation

## 2022-06-07 DIAGNOSIS — T7840XA Allergy, unspecified, initial encounter: Secondary | ICD-10-CM | POA: Diagnosis not present

## 2022-06-07 DIAGNOSIS — I10 Essential (primary) hypertension: Secondary | ICD-10-CM | POA: Diagnosis not present

## 2022-06-07 DIAGNOSIS — E669 Obesity, unspecified: Secondary | ICD-10-CM | POA: Diagnosis not present

## 2022-06-07 DIAGNOSIS — H2511 Age-related nuclear cataract, right eye: Secondary | ICD-10-CM | POA: Diagnosis not present

## 2022-06-07 DIAGNOSIS — Z6830 Body mass index (BMI) 30.0-30.9, adult: Secondary | ICD-10-CM | POA: Diagnosis not present

## 2022-06-07 DIAGNOSIS — Z87891 Personal history of nicotine dependence: Secondary | ICD-10-CM | POA: Insufficient documentation

## 2022-06-07 HISTORY — DX: Presence of dental prosthetic device (complete) (partial): Z97.2

## 2022-06-07 HISTORY — PX: CATARACT EXTRACTION W/PHACO: SHX586

## 2022-06-07 SURGERY — PHACOEMULSIFICATION, CATARACT, WITH IOL INSERTION
Anesthesia: Monitor Anesthesia Care | Site: Eye | Laterality: Right

## 2022-06-07 MED ORDER — SIGHTPATH DOSE#1 BSS IO SOLN
INTRAOCULAR | Status: DC | PRN
  Administered 2022-06-07: 15 mL

## 2022-06-07 MED ORDER — TETRACAINE HCL 0.5 % OP SOLN
1.0000 [drp] | OPHTHALMIC | Status: DC | PRN
  Administered 2022-06-07 (×3): 1 [drp] via OPHTHALMIC

## 2022-06-07 MED ORDER — SIGHTPATH DOSE#1 BSS IO SOLN
INTRAOCULAR | Status: DC | PRN
  Administered 2022-06-07: 65 mL via OPHTHALMIC

## 2022-06-07 MED ORDER — ARMC OPHTHALMIC DILATING DROPS
1.0000 | OPHTHALMIC | Status: DC | PRN
Start: 2022-06-07 — End: 2022-06-07
  Administered 2022-06-07 (×3): 1 via OPHTHALMIC

## 2022-06-07 MED ORDER — SIGHTPATH DOSE#1 BSS IO SOLN
INTRAOCULAR | Status: DC | PRN
  Administered 2022-06-07: 1 mL via INTRAMUSCULAR

## 2022-06-07 MED ORDER — CEFUROXIME OPHTHALMIC INJECTION 1 MG/0.1 ML
INJECTION | OPHTHALMIC | Status: DC | PRN
  Administered 2022-06-07: .1 mL via INTRACAMERAL

## 2022-06-07 MED ORDER — SIGHTPATH DOSE#1 NA HYALUR & NA CHOND-NA HYALUR IO KIT
PACK | INTRAOCULAR | Status: DC | PRN
  Administered 2022-06-07: 1 via OPHTHALMIC

## 2022-06-07 MED ORDER — LACTATED RINGERS IV SOLN: INTRAVENOUS | Status: DC

## 2022-06-07 MED ORDER — FENTANYL CITRATE (PF) 100 MCG/2ML IJ SOLN
INTRAMUSCULAR | Status: DC | PRN
  Administered 2022-06-07: 100 ug via INTRAVENOUS

## 2022-06-07 MED ORDER — MIDAZOLAM HCL 2 MG/2ML IJ SOLN
INTRAMUSCULAR | Status: DC | PRN
  Administered 2022-06-07: 2 mg via INTRAVENOUS

## 2022-06-07 MED ORDER — BRIMONIDINE TARTRATE-TIMOLOL 0.2-0.5 % OP SOLN
OPHTHALMIC | Status: DC | PRN
  Administered 2022-06-07: 1 [drp] via OPHTHALMIC

## 2022-06-07 SURGICAL SUPPLY — 10 items
CATARACT SUITE SIGHTPATH (MISCELLANEOUS) ×1 IMPLANT
FEE CATARACT SUITE SIGHTPATH (MISCELLANEOUS) ×1 IMPLANT
GLOVE SRG 8 PF TXTR STRL LF DI (GLOVE) ×1 IMPLANT
GLOVE SURG ENC TEXT LTX SZ7.5 (GLOVE) ×1 IMPLANT
GLOVE SURG UNDER POLY LF SZ8 (GLOVE) ×1
LENS IOL TECNIS EYHANCE 21.5 (Intraocular Lens) IMPLANT
NDL FILTER BLUNT 18X1 1/2 (NEEDLE) ×1 IMPLANT
NEEDLE FILTER BLUNT 18X1 1/2 (NEEDLE) ×1 IMPLANT
SYR 3ML LL SCALE MARK (SYRINGE) ×1 IMPLANT
WATER STERILE IRR 250ML POUR (IV SOLUTION) ×1 IMPLANT

## 2022-06-07 NOTE — Anesthesia Preprocedure Evaluation (Signed)
Anesthesia Evaluation  Patient identified by MRN, date of birth, ID band Patient awake    Reviewed: Allergy & Precautions, NPO status , Patient's Chart, lab work & pertinent test results  History of Anesthesia Complications Negative for: history of anesthetic complications  Airway Mallampati: III  TM Distance: >3 FB Neck ROM: full    Dental  (+) Chipped, Poor Dentition, Upper Dentures   Pulmonary neg pulmonary ROS, neg shortness of breath, former smoker   Pulmonary exam normal        Cardiovascular Exercise Tolerance: Good hypertension, (-) angina negative cardio ROS Normal cardiovascular exam     Neuro/Psych negative neurological ROS  negative psych ROS   GI/Hepatic negative GI ROS, Neg liver ROS,,,  Endo/Other  diabetes, Type 2    Renal/GU      Musculoskeletal   Abdominal   Peds  Hematology negative hematology ROS (+)   Anesthesia Other Findings Past Medical History: No date: Allergy No date: BPH (benign prostatic hypertrophy) No date: Diabetes mellitus     Comment:  no meds - diet controlled No date: ED (erectile dysfunction) No date: History of prostate cancer No date: Hx of adenomatous colonic polyps No date: Hyperlipidemia No date: Hypertension No date: Osteoarthritis No date: Wears dentures     Comment:  Partial upper  Past Surgical History: 1956: APPENDECTOMY 1996: FOOT FRACTURE SURGERY 03/06/2016: KNEE ARTHROPLASTY; Right     Comment:  Procedure: COMPUTER ASSISTED TOTAL KNEE ARTHROPLASTY;                Surgeon: Dereck Leep, MD;  Location: [facility] ORS;                Service: Orthopedics;  Laterality: Right; 11/2005: KNEE ARTHROSCOPY     Comment:  right knee 01/2009: PROSTATE SURGERY     Comment:  I-131 implants 09/2007: SINUS SURGERY WITH INSTATRAK  BMI    Body Mass Index: 30.54 kg/m      Reproductive/Obstetrics negative OB ROS                              Anesthesia Physical Anesthesia Plan  ASA: 3  Anesthesia Plan: MAC   Post-op Pain Management:    Induction: Intravenous  PONV Risk Score and Plan:   Airway Management Planned: Natural Airway and Nasal Cannula  Additional Equipment:   Intra-op Plan:   Post-operative Plan:   Informed Consent: I have reviewed the patients History and Physical, chart, labs and discussed the procedure including the risks, benefits and alternatives for the proposed anesthesia with the patient or authorized representative who has indicated his/her understanding and acceptance.     Dental Advisory Given  Plan Discussed with: Anesthesiologist, CRNA and Surgeon  Anesthesia Plan Comments: (Patient and Wife consented for risks of anesthesia including but not limited to:  - adverse reactions to medications - damage to eyes, teeth, lips or other oral mucosa - nerve damage due to positioning  - sore throat or hoarseness - Damage to heart, brain, nerves, lungs, other parts of body or loss of life  They voiced understanding.)       Anesthesia Quick Evaluation

## 2022-06-07 NOTE — Op Note (Signed)
LOCATION:  [place]   PREOPERATIVE DIAGNOSIS:    Nuclear sclerotic cataract right eye. H25.11   POSTOPERATIVE DIAGNOSIS:  Nuclear sclerotic cataract right eye.     PROCEDURE:  Phacoemusification with posterior chamber intraocular lens placement of the right eye   ULTRASOUND TIME: Procedure(s): CATARACT EXTRACTION PHACO AND INTRAOCULAR LENS PLACEMENT (IOC) RIGHT DIABETIC  9.45  00:54.9 (Right)  LENS:   Implant Name Type Inv. Item Serial No. Manufacturer Lot No. LRB No. Used Action  LENS IOL TECNIS EYHANCE 21.5 - [id number] Intraocular Lens LENS IOL TECNIS EYHANCE 21.5 [id number] [employer]  Right 1 Implanted         SURGEON:  Wyonia Hough, MD   ANESTHESIA:  Topical with tetracaine drops and 2% Xylocaine jelly, augmented with 1% preservative-free intracameral lidocaine.    COMPLICATIONS:  None.   DESCRIPTION OF PROCEDURE:  The patient was identified in the holding room and transported to the operating room and placed in the supine position under the operating microscope.  The right eye was identified as the operative eye and it was prepped and draped in the usual sterile ophthalmic fashion.   A 1 millimeter clear-corneal paracentesis was made at the 12:00 position.  0.5 ml of preservative-free 1% lidocaine was injected into the anterior chamber. The anterior chamber was filled with Viscoat viscoelastic.  A 2.4 millimeter keratome was used to make a near-clear corneal incision at the 9:00 position.  A curvilinear capsulorrhexis was made with a cystotome and capsulorrhexis forceps.  Balanced salt solution was used to hydrodissect and hydrodelineate the nucleus.   Phacoemulsification was then used in stop and chop fashion to remove the lens nucleus and epinucleus.  The remaining cortex was then removed using the irrigation and aspiration handpiece. Provisc was then placed into the capsular bag to distend it for lens placement.  A lens was then injected into the capsular  bag.  The remaining viscoelastic was aspirated.   Wounds were hydrated with balanced salt solution.  The anterior chamber was inflated to a physiologic pressure with balanced salt solution.  No wound leaks were noted. Cefuroxime 0.1 ml of a '10mg'$ /ml solution was injected into the anterior chamber for a dose of 1 mg of intracameral antibiotic at the completion of the case.   Timolol and Brimonidine drops were applied to the eye.  The patient was taken to the recovery room in stable condition without complications of anesthesia or surgery.   Ashlye Oviedo 06/07/2022, 12:02 PM

## 2022-06-07 NOTE — Anesthesia Postprocedure Evaluation (Signed)
Anesthesia Post Note  Patient: Rodney Suarez  Procedure(s) Performed: CATARACT EXTRACTION PHACO AND INTRAOCULAR LENS PLACEMENT (IOC) RIGHT DIABETIC  9.45  00:54.9 (Right: Eye)  Patient location during evaluation: PACU Anesthesia Type: MAC Level of consciousness: awake and alert Pain management: pain level controlled Vital Signs Assessment: post-procedure vital signs reviewed and stable Respiratory status: spontaneous breathing, nonlabored ventilation, respiratory function stable and patient connected to nasal cannula oxygen Cardiovascular status: blood pressure returned to baseline and stable Postop Assessment: no apparent nausea or vomiting Anesthetic complications: no   No notable events documented.   Last Vitals:  Vitals:   06/07/22 1206 06/07/22 1207  BP:  124/75  Pulse: 77   Resp: 14 18  Temp:    SpO2: 97%     Last Pain:  Vitals:   06/07/22 1207  PainSc: 0-No pain                 Precious Haws Loreena Valeri

## 2022-06-07 NOTE — Transfer of Care (Signed)
Immediate Anesthesia Transfer of Care Note  Patient: Rodney Suarez  Procedure(s) Performed: CATARACT EXTRACTION PHACO AND INTRAOCULAR LENS PLACEMENT (IOC) RIGHT DIABETIC  9.45  00:54.9 (Right: Eye)  Patient Location: PACU  Anesthesia Type: MAC  Level of Consciousness: awake, alert  and patient cooperative  Airway and Oxygen Therapy: Patient Spontanous Breathing and Patient connected to supplemental oxygen  Post-op Assessment: Post-op Vital signs reviewed, Patient's Cardiovascular Status Stable, Respiratory Function Stable, Patent Airway and No signs of Nausea or vomiting  Post-op Vital Signs: Reviewed and stable  Complications: No notable events documented.

## 2022-06-07 NOTE — H&P (Signed)
[facility]   Primary Care Physician:  Venia Carbon, MD Ophthalmologist: Dr. Leandrew Koyanagi  Pre-Procedure History & Physical: HPI:  Rodney Suarez is a 82 y.o. male here for ophthalmic surgery.   Past Medical History:  Diagnosis Date   Allergy    BPH (benign prostatic hypertrophy)    Diabetes mellitus    no meds - diet controlled   ED (erectile dysfunction)    History of prostate cancer    Hx of adenomatous colonic polyps    Hyperlipidemia    Hypertension    Osteoarthritis    Wears dentures    Partial upper    Past Surgical History:  Procedure Laterality Date   [place] ARTHROPLASTY Right 03/06/2016   Procedure: COMPUTER ASSISTED TOTAL KNEE ARTHROPLASTY;  Surgeon: Dereck Leep, MD;  Location: [facility] ORS;  Service: Orthopedics;  Laterality: Right;   KNEE ARTHROSCOPY  11/2005   right knee   PROSTATE SURGERY  01/2009   I-131 implants   SINUS SURGERY WITH INSTATRAK  09/2007    Prior to Admission medications   Medication Sig Start Date End Date Taking? Authorizing Provider  amLODipine (NORVASC) 10 MG tablet TAKE 1 TABLET BY MOUTH EVERY DAY 04/04/22  Yes Viviana Simpler I, MD  aspirin EC 81 MG tablet Take 81 mg by mouth daily.   Yes [provider]  ketoconazole (NIZORAL) 2 % cream APPLY TOPICALLY 2 (TWO) TIMES DAILY AS NEEDED FOR IRRITATION. 03/16/22  Yes Venia Carbon, MD  KLOR-CON M20 20 MEQ tablet TAKE 1 TABLET BY MOUTH EVERY DAY 12/15/21  Yes Venia Carbon, MD  lisinopril-hydrochlorothiazide (ZESTORETIC) 20-12.5 MG tablet TAKE 2 TABLETS BY MOUTH EVERY DAY 04/24/22  Yes Venia Carbon, MD  LORazepam (ATIVAN) 0.5 MG tablet TAKE 2 TO 4 TABLETS BY MOUTH AT BEDTIME AS NEEDED FOR SLEEP 06/01/22  Yes Venia Carbon, MD  mupirocin ointment (BACTROBAN) 2 % Apply 1 Application topically 2 (two) times daily. For a week. In the nose 05/22/22  Yes Viviana Simpler I, MD  pravastatin (PRAVACHOL) 20 MG  tablet TAKE 1 TABLET BY MOUTH EVERY DAY 03/21/22  Yes Venia Carbon, MD  timolol (TIMOPTIC) 0.5 % ophthalmic solution Place 1 drop into the right eye 2 (two) times daily. 01/29/21  Yes [provider]    Allergies as of 05/24/2022 - Review Complete 05/22/2022  Allergen Reaction Noted   Meloxicam Other (See Comments) 04/16/2015   Metformin  03/22/2006    History reviewed. No pertinent family history.  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  Tobacco Use   Smoking status: Former    Packs/day: 0.50    Years: 55.00    Total pack years: 27.50    Types: Cigarettes    Quit date: 04/04/2011    Years since quitting: 11.1    Passive exposure: Never   Smokeless tobacco: Never  Vaping Use   Vaping Use: Never used  Substance and Sexual Activity   Alcohol use: No   Drug use: No   Sexual activity: Not on file  Other Topics Concern   Not on file  Social History Narrative  No living will   Wife and daughter should be health care POAs   Discussed DNR--he requests this (done 10/14/12)   No tube feedings if cognitively unaware   Social Determinants of Health   Financial Resource Strain: Not on file  Food Insecurity: Not on file  Transportation Needs: Not on file  Physical Activity: Not on file  Stress: Not on file  Social Connections: Not on file  Intimate Partner Violence: Not on file    Review of Systems: See HPI, otherwise negative ROS  Physical Exam: Ht '5\' 11"'$  (1.803 m)   Wt 99.3 kg   BMI 30.54 kg/m  General:   Alert,  pleasant and cooperative in NAD Head:  Normocephalic and atraumatic. Lungs:  Clear to auscultation.    Heart:  Regular rate and rhythm.   Impression/Plan: Rodney Suarez is here for  ophthalmic surgery.  Risks, benefits, limitations, and alternatives regarding ophthalmic surgery have been reviewed with the patient.  Questions have been answered.  All parties agreeable.   Leandrew Koyanagi, MD  06/07/2022, 10:49 AM

## 2022-06-08 ENCOUNTER — Encounter: Payer: Self-pay | Admitting: Ophthalmology

## 2022-06-13 DIAGNOSIS — H34831 Tributary (branch) retinal vein occlusion, right eye, with macular edema: Secondary | ICD-10-CM | POA: Diagnosis not present

## 2022-06-13 DIAGNOSIS — H35371 Puckering of macula, right eye: Secondary | ICD-10-CM | POA: Diagnosis not present

## 2022-06-13 DIAGNOSIS — H348312 Tributary (branch) retinal vein occlusion, right eye, stable: Secondary | ICD-10-CM | POA: Diagnosis not present

## 2022-06-15 DIAGNOSIS — R040 Epistaxis: Secondary | ICD-10-CM | POA: Diagnosis not present

## 2022-06-27 ENCOUNTER — Other Ambulatory Visit: Payer: Medicare HMO

## 2022-06-27 DIAGNOSIS — C61 Malignant neoplasm of prostate: Secondary | ICD-10-CM | POA: Diagnosis not present

## 2022-06-28 LAB — PSA: Prostate Specific Ag, Serum: <0.1 ng/mL (ref 0.0–4.0)

## 2022-06-29 ENCOUNTER — Encounter: Payer: Self-pay | Admitting: Radiation Oncology

## 2022-06-29 ENCOUNTER — Ambulatory Visit
Admission: RE | Admit: 2022-06-29 | Discharge: 2022-06-29 | Disposition: A | Discharge status: Home / Self Care | Payer: Medicare HMO | Source: Ambulatory Visit | Attending: Radiation Oncology | Admitting: Radiation Oncology

## 2022-06-29 ENCOUNTER — Other Ambulatory Visit: Payer: Self-pay | Admitting: *Deleted

## 2022-06-29 VITALS — BP 135/76 | HR 72 | Temp 97.0°F | Resp 18 | Wt 220.0 lb

## 2022-06-29 DIAGNOSIS — Z923 Personal history of irradiation: Secondary | ICD-10-CM | POA: Diagnosis not present

## 2022-06-29 DIAGNOSIS — C61 Malignant neoplasm of prostate: Secondary | ICD-10-CM

## 2022-06-29 DIAGNOSIS — Z191 Hormone sensitive malignancy status: Secondary | ICD-10-CM | POA: Diagnosis not present

## 2022-06-29 NOTE — Progress Notes (Signed)
Radiation Oncology Follow up Note  Name: Rodney Suarez   Date:   06/29/2022 MRN:  [id number] DOB: 1940/08/01    This 82 y.o. male presents to the clinic today for 47-month follow-up status post salvage IMRT radiation therapy 12 years out from I-125 interstitial implant with biochemical failure.  REFERRING PROVIDER: Venia Carbon, MD  HPI: Patient is a 82 year old male now out over 16 months having completed salvage radiation therapy status post I-125 interstitial implant with biochemical failure 12 years prior.  Seen today in routine follow-up he is doing well.  He specifically denies any increased lower urinary tract symptoms diarrhea or fatigue.  His most recent PSA is less than 1.1..  COMPLICATIONS OF TREATMENT: none  FOLLOW UP COMPLIANCE: keeps appointments   PHYSICAL EXAM:  BP 135/76 (Patient Position: Sitting)   Pulse 72   Temp (!) 97 F (36.1 C)   Resp 18   Wt 220 lb (99.8 kg)   SpO2 100%   BMI 30.68 kg/m  Well-developed well-nourished patient in NAD. HEENT reveals PERLA, EOMI, discs not visualized.  Oral cavity is clear. No oral mucosal lesions are identified. Neck is clear without evidence of cervical or supraclavicular adenopathy. Lungs are clear to A&P. Cardiac examination is essentially unremarkable with regular rate and rhythm without murmur rub or thrill. Abdomen is benign with no organomegaly or masses noted. Motor sensory and DTR levels are equal and symmetric in the upper and lower extremities. Cranial nerves II through XII are grossly intact. Proprioception is intact. No peripheral adenopathy or edema is identified. No motor or sensory levels are noted. Crude visual fields are within normal range.  RADIOLOGY RESULTS: No current films for review  PLAN: Present time patient is under excellent biochemical control of his prostate cancer thinking about a whole year and repeat his PSA.  Patient is to call with any concerns.  I would like to take this opportunity to  thank you for allowing me to participate in the care of your patient.[name], MD

## 2022-07-04 DIAGNOSIS — H2513 Age-related nuclear cataract, bilateral: Secondary | ICD-10-CM | POA: Diagnosis not present

## 2022-07-04 DIAGNOSIS — H209 Unspecified iridocyclitis: Secondary | ICD-10-CM | POA: Diagnosis not present

## 2022-07-04 DIAGNOSIS — H348312 Tributary (branch) retinal vein occlusion, right eye, stable: Secondary | ICD-10-CM | POA: Diagnosis not present

## 2022-07-04 DIAGNOSIS — E119 Type 2 diabetes mellitus without complications: Secondary | ICD-10-CM | POA: Diagnosis not present

## 2022-07-04 DIAGNOSIS — H401134 Primary open-angle glaucoma, bilateral, indeterminate stage: Secondary | ICD-10-CM | POA: Diagnosis not present

## 2022-07-04 DIAGNOSIS — H35371 Puckering of macula, right eye: Secondary | ICD-10-CM | POA: Diagnosis not present

## 2022-07-04 DIAGNOSIS — H34831 Tributary (branch) retinal vein occlusion, right eye, with macular edema: Secondary | ICD-10-CM | POA: Diagnosis not present

## 2022-07-04 DIAGNOSIS — Z961 Presence of intraocular lens: Secondary | ICD-10-CM | POA: Diagnosis not present

## 2022-07-05 ENCOUNTER — Telehealth: Payer: Self-pay | Admitting: Family Medicine

## 2022-07-05 NOTE — Telephone Encounter (Signed)
Attempted to reach patient, No voicemail. 1st attempt

## 2022-07-05 NOTE — Telephone Encounter (Signed)
-----   Message from Hollice Espy, MD sent at 07/03/2022  3:44 PM EDT ----- PSA is excellent, undetectable.  Follow-up as scheduled.  Hollice Espy, MD

## 2022-07-06 ENCOUNTER — Telehealth: Payer: Self-pay

## 2022-07-06 NOTE — Telephone Encounter (Signed)
Spoke with pt. Pt. Given PSA results and verbalized understanding.

## 2022-07-06 NOTE — Telephone Encounter (Signed)
Spoke with pt. Pt. Advised of all results and verbalized understanding.  ?

## 2022-07-06 NOTE — Telephone Encounter (Signed)
Left message on Rodney Suarez's voicemail asking to call us back to be advised, ok per DPR on file

## 2022-07-27 ENCOUNTER — Other Ambulatory Visit: Payer: Self-pay | Admitting: Internal Medicine

## 2022-07-28 NOTE — Telephone Encounter (Signed)
Last filled 06-01-22 #120 Last OV 05-22-22 No Future OV [facility] [address]

## 2022-08-11 DIAGNOSIS — Z961 Presence of intraocular lens: Secondary | ICD-10-CM | POA: Diagnosis not present

## 2022-08-11 DIAGNOSIS — H401134 Primary open-angle glaucoma, bilateral, indeterminate stage: Secondary | ICD-10-CM | POA: Diagnosis not present

## 2022-08-11 DIAGNOSIS — H209 Unspecified iridocyclitis: Secondary | ICD-10-CM | POA: Diagnosis not present

## 2022-08-11 DIAGNOSIS — H2513 Age-related nuclear cataract, bilateral: Secondary | ICD-10-CM | POA: Diagnosis not present

## 2022-08-11 DIAGNOSIS — H348312 Tributary (branch) retinal vein occlusion, right eye, stable: Secondary | ICD-10-CM | POA: Diagnosis not present

## 2022-08-11 DIAGNOSIS — H35371 Puckering of macula, right eye: Secondary | ICD-10-CM | POA: Diagnosis not present

## 2022-08-11 DIAGNOSIS — E119 Type 2 diabetes mellitus without complications: Secondary | ICD-10-CM | POA: Diagnosis not present

## 2022-08-11 DIAGNOSIS — H34831 Tributary (branch) retinal vein occlusion, right eye, with macular edema: Secondary | ICD-10-CM | POA: Diagnosis not present

## 2022-08-29 DIAGNOSIS — H34831 Tributary (branch) retinal vein occlusion, right eye, with macular edema: Secondary | ICD-10-CM | POA: Diagnosis not present

## 2022-08-29 DIAGNOSIS — Z961 Presence of intraocular lens: Secondary | ICD-10-CM | POA: Diagnosis not present

## 2022-08-29 DIAGNOSIS — H401134 Primary open-angle glaucoma, bilateral, indeterminate stage: Secondary | ICD-10-CM | POA: Diagnosis not present

## 2022-08-29 DIAGNOSIS — H2512 Age-related nuclear cataract, left eye: Secondary | ICD-10-CM | POA: Diagnosis not present

## 2022-09-11 DIAGNOSIS — H209 Unspecified iridocyclitis: Secondary | ICD-10-CM | POA: Diagnosis not present

## 2022-09-11 DIAGNOSIS — H2513 Age-related nuclear cataract, bilateral: Secondary | ICD-10-CM | POA: Diagnosis not present

## 2022-09-11 DIAGNOSIS — H348312 Tributary (branch) retinal vein occlusion, right eye, stable: Secondary | ICD-10-CM | POA: Diagnosis not present

## 2022-09-11 DIAGNOSIS — H401134 Primary open-angle glaucoma, bilateral, indeterminate stage: Secondary | ICD-10-CM | POA: Diagnosis not present

## 2022-09-11 DIAGNOSIS — H35371 Puckering of macula, right eye: Secondary | ICD-10-CM | POA: Diagnosis not present

## 2022-09-11 DIAGNOSIS — E119 Type 2 diabetes mellitus without complications: Secondary | ICD-10-CM | POA: Diagnosis not present

## 2022-09-11 DIAGNOSIS — H34831 Tributary (branch) retinal vein occlusion, right eye, with macular edema: Secondary | ICD-10-CM | POA: Diagnosis not present

## 2022-09-11 DIAGNOSIS — Z961 Presence of intraocular lens: Secondary | ICD-10-CM | POA: Diagnosis not present

## 2022-09-12 DIAGNOSIS — H209 Unspecified iridocyclitis: Secondary | ICD-10-CM | POA: Diagnosis not present

## 2022-09-12 DIAGNOSIS — E119 Type 2 diabetes mellitus without complications: Secondary | ICD-10-CM | POA: Diagnosis not present

## 2022-09-12 DIAGNOSIS — Z961 Presence of intraocular lens: Secondary | ICD-10-CM | POA: Diagnosis not present

## 2022-09-12 DIAGNOSIS — H348312 Tributary (branch) retinal vein occlusion, right eye, stable: Secondary | ICD-10-CM | POA: Diagnosis not present

## 2022-09-12 DIAGNOSIS — H2513 Age-related nuclear cataract, bilateral: Secondary | ICD-10-CM | POA: Diagnosis not present

## 2022-09-12 DIAGNOSIS — H401134 Primary open-angle glaucoma, bilateral, indeterminate stage: Secondary | ICD-10-CM | POA: Diagnosis not present

## 2022-09-12 DIAGNOSIS — H34831 Tributary (branch) retinal vein occlusion, right eye, with macular edema: Secondary | ICD-10-CM | POA: Diagnosis not present

## 2022-09-12 DIAGNOSIS — H35371 Puckering of macula, right eye: Secondary | ICD-10-CM | POA: Diagnosis not present

## 2022-09-19 DIAGNOSIS — H401134 Primary open-angle glaucoma, bilateral, indeterminate stage: Secondary | ICD-10-CM | POA: Diagnosis not present

## 2022-09-19 DIAGNOSIS — H348312 Tributary (branch) retinal vein occlusion, right eye, stable: Secondary | ICD-10-CM | POA: Diagnosis not present

## 2022-09-19 DIAGNOSIS — E119 Type 2 diabetes mellitus without complications: Secondary | ICD-10-CM | POA: Diagnosis not present

## 2022-09-19 DIAGNOSIS — H34831 Tributary (branch) retinal vein occlusion, right eye, with macular edema: Secondary | ICD-10-CM | POA: Diagnosis not present

## 2022-09-19 DIAGNOSIS — H35371 Puckering of macula, right eye: Secondary | ICD-10-CM | POA: Diagnosis not present

## 2022-09-19 DIAGNOSIS — H2513 Age-related nuclear cataract, bilateral: Secondary | ICD-10-CM | POA: Diagnosis not present

## 2022-09-19 DIAGNOSIS — H209 Unspecified iridocyclitis: Secondary | ICD-10-CM | POA: Diagnosis not present

## 2022-09-19 DIAGNOSIS — Z961 Presence of intraocular lens: Secondary | ICD-10-CM | POA: Diagnosis not present

## 2022-09-29 IMAGING — US US CAROTID DUPLEX BILAT
1 series · 13 of 24 positions shown · non-contrast
Comparison: None.

CLINICAL DATA: Amaurosis fugax

EXAM:
BILATERAL CAROTID DUPLEX ULTRASOUND
TECHNIQUE: Gray scale imaging, color Doppler and duplex ultrasound were
performed of bilateral carotid and vertebral arteries in the neck.

[Series 1: us carotid bilateral · 13 of 62 slices shown]
[im 1/62]
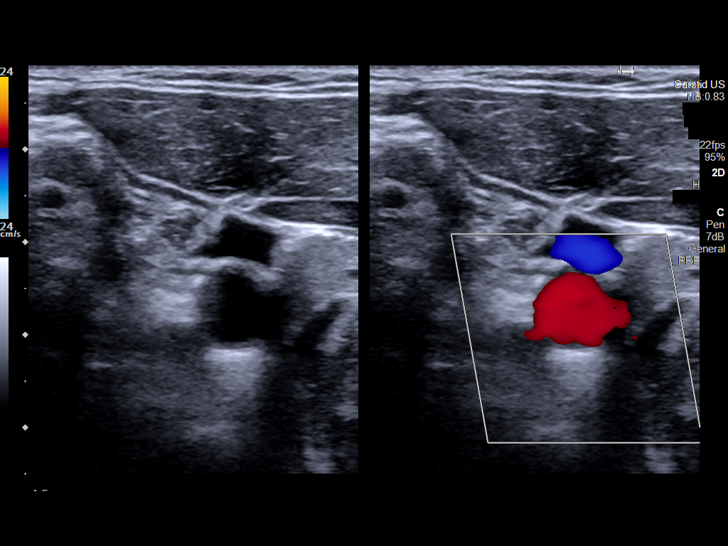
[im 6/62]
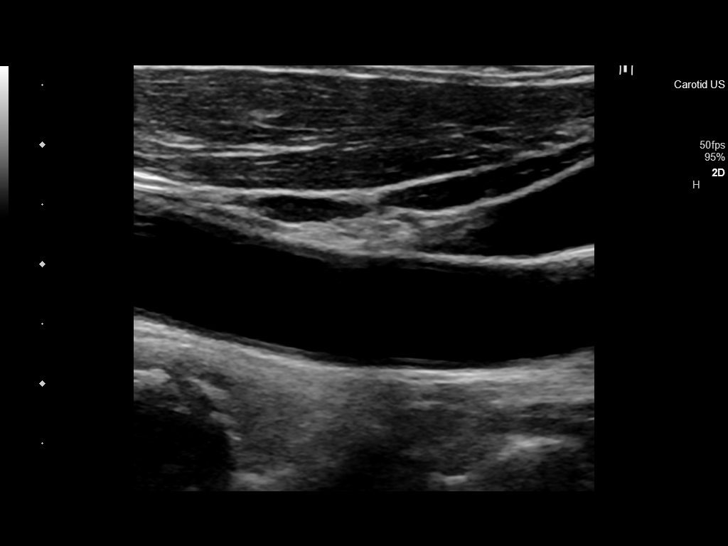
[im 11/62]
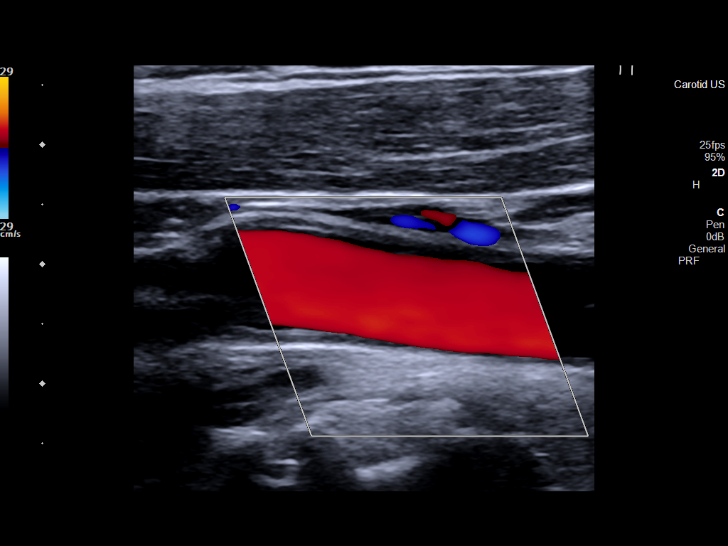
[im 16/62]
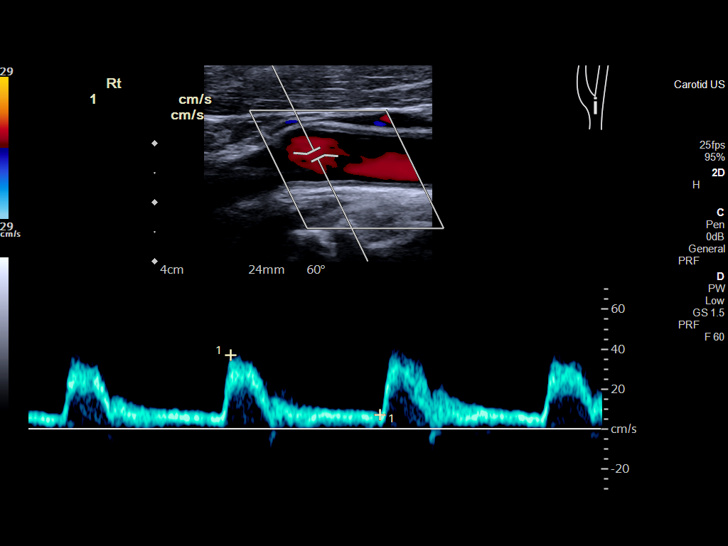
[im 22/62]
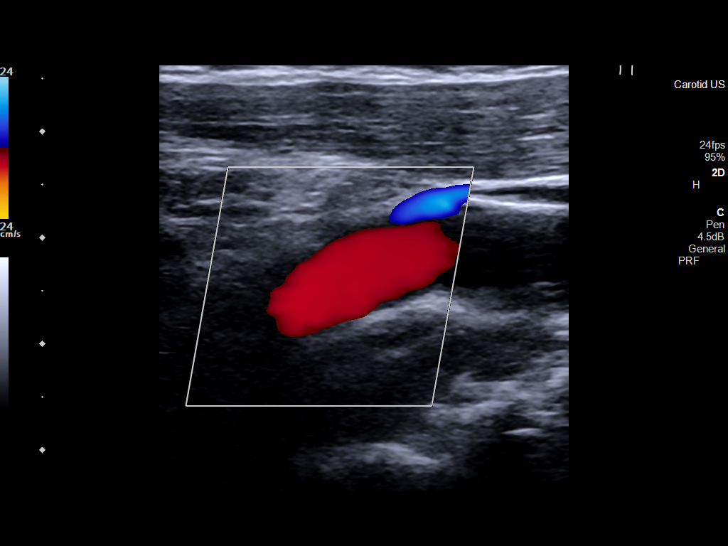
[im 27/62]
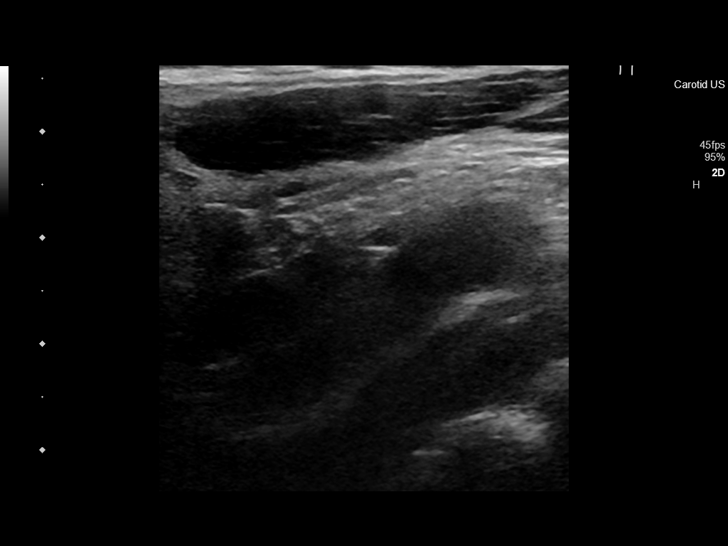
[im 32/62]
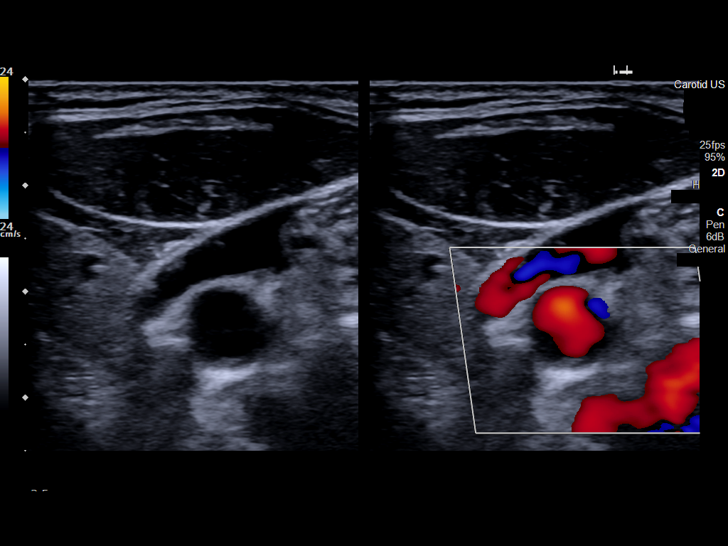
[im 35/62]
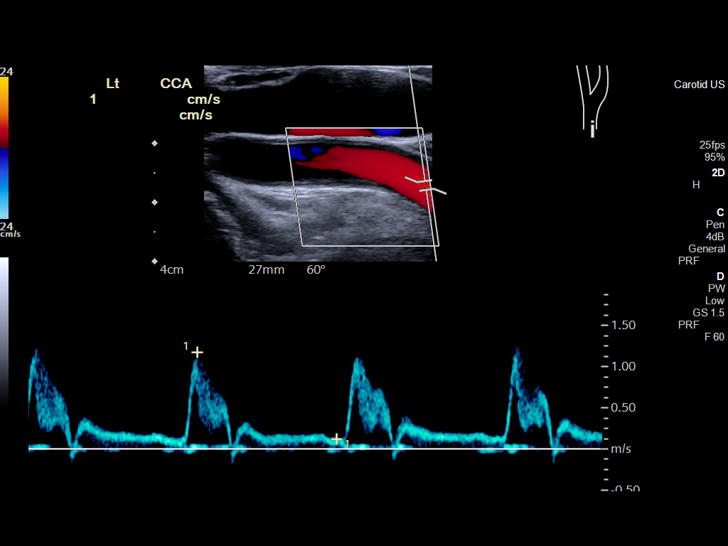
[im 40/62]
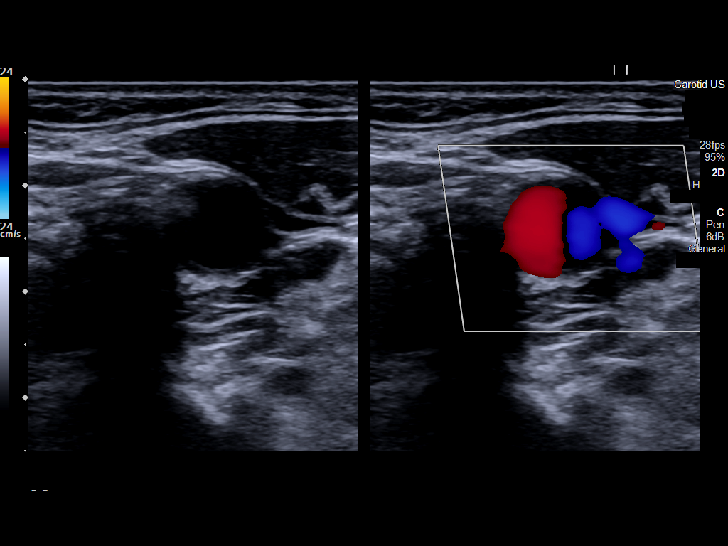
[im 46/62]
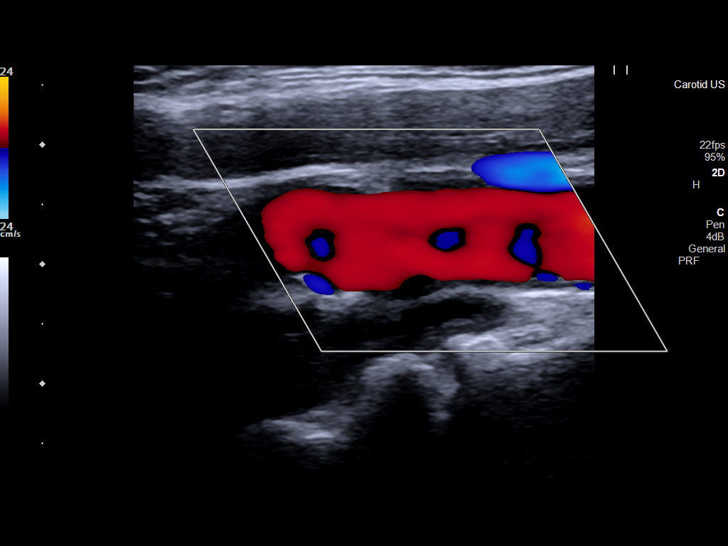
[im 51/62]
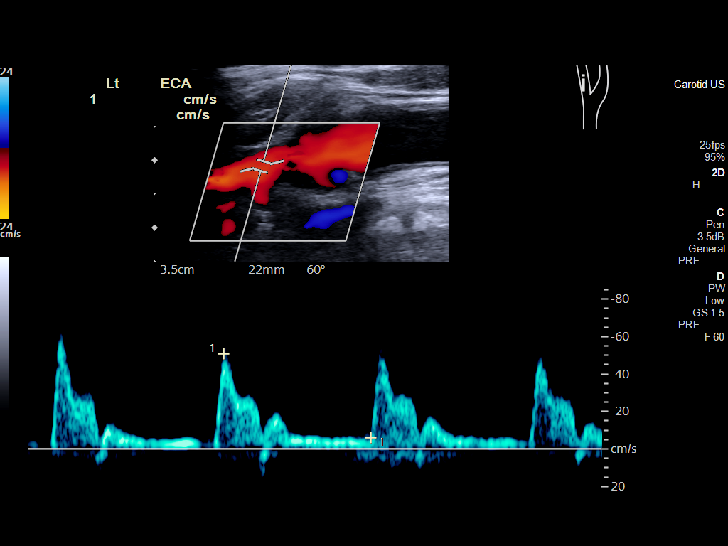
[im 56/62]
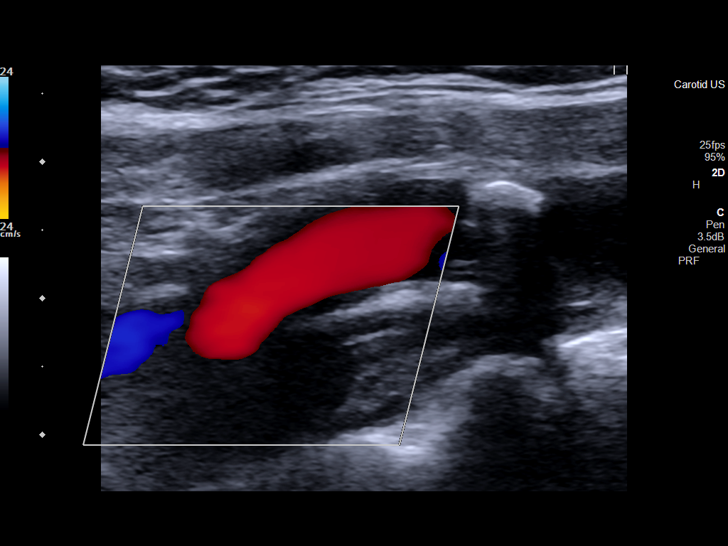
[im 62/62]
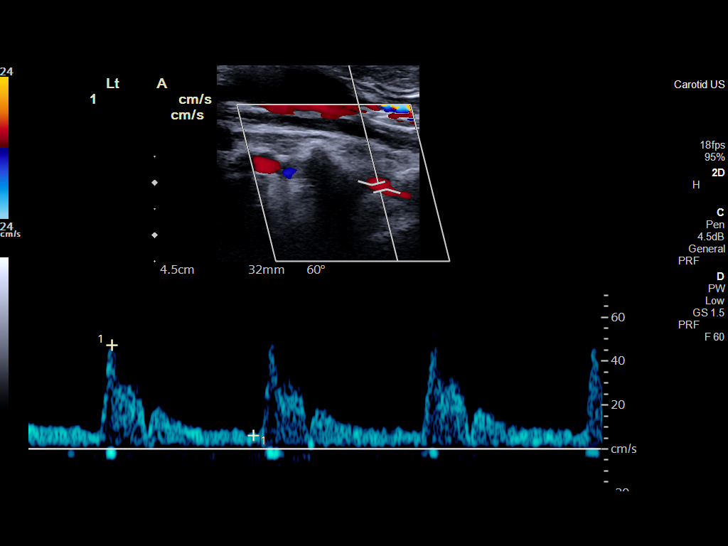

[13 of 24 positions shown; findings below may reference images not displayed]

FINDINGS: Criteria: Quantification of carotid stenosis is based on velocity
parameters that correlate the residual internal carotid diameter
with NASCET-based stenosis levels, using the diameter of the distal
internal carotid lumen as the denominator for stenosis measurement.

The following velocity measurements were obtained:

RIGHT
ICA: 46/14 cm/sec
CCA: 51/8 cm/sec

SYSTOLIC ICA/CCA RATIO:

ECA:  61 cm/sec

LEFT

ICA: 53/12 cm/sec

CCA: 62/5 cm/sec

SYSTOLIC ICA/CCA RATIO:

ECA:  51 cm/sec

RIGHT CAROTID ARTERY: Trace heterogeneous atherosclerotic plaque in
the proximal internal carotid artery. By peak systolic velocity
criteria, the estimated stenosis is less than 50%.

RIGHT VERTEBRAL ARTERY:  Patent with normal antegrade flow.

LEFT CAROTID ARTERY: Trace heterogeneous atherosclerotic plaque in
the proximal internal carotid artery. By peak systolic velocity
criteria, the estimated stenosis is less than 50%.

LEFT VERTEBRAL ARTERY:  Patent with normal antegrade flow.
IMPRESSION: 1. Mild (1-49%) stenosis proximal right internal carotid artery
secondary to heterogenous atherosclerotic plaque.
2. Mild (1-49%) stenosis proximal left internal carotid artery
secondary to heterogenous atherosclerotic plaque.
3. Vertebral arteries are patent with normal antegrade flow.

## 2022-10-02 ENCOUNTER — Other Ambulatory Visit: Payer: Self-pay | Admitting: Internal Medicine

## 2022-10-02 DIAGNOSIS — H348312 Tributary (branch) retinal vein occlusion, right eye, stable: Secondary | ICD-10-CM | POA: Diagnosis not present

## 2022-10-02 DIAGNOSIS — H35371 Puckering of macula, right eye: Secondary | ICD-10-CM | POA: Diagnosis not present

## 2022-10-02 DIAGNOSIS — H401134 Primary open-angle glaucoma, bilateral, indeterminate stage: Secondary | ICD-10-CM | POA: Diagnosis not present

## 2022-10-02 DIAGNOSIS — Z961 Presence of intraocular lens: Secondary | ICD-10-CM | POA: Diagnosis not present

## 2022-10-02 DIAGNOSIS — H34831 Tributary (branch) retinal vein occlusion, right eye, with macular edema: Secondary | ICD-10-CM | POA: Diagnosis not present

## 2022-10-02 DIAGNOSIS — H209 Unspecified iridocyclitis: Secondary | ICD-10-CM | POA: Diagnosis not present

## 2022-10-03 NOTE — Telephone Encounter (Signed)
Last filled 07-28-22 #120 Last OV 05-22-22 No Future OV [facility] [address]

## 2022-10-13 DIAGNOSIS — H40003 Preglaucoma, unspecified, bilateral: Secondary | ICD-10-CM | POA: Diagnosis not present

## 2022-10-23 DIAGNOSIS — H4051X4 Glaucoma secondary to other eye disorders, right eye, indeterminate stage: Secondary | ICD-10-CM | POA: Diagnosis not present

## 2022-10-23 DIAGNOSIS — H34831 Tributary (branch) retinal vein occlusion, right eye, with macular edema: Secondary | ICD-10-CM | POA: Diagnosis not present

## 2022-10-30 DIAGNOSIS — E119 Type 2 diabetes mellitus without complications: Secondary | ICD-10-CM | POA: Diagnosis not present

## 2022-10-30 DIAGNOSIS — H409 Unspecified glaucoma: Secondary | ICD-10-CM | POA: Diagnosis not present

## 2022-10-30 DIAGNOSIS — E785 Hyperlipidemia, unspecified: Secondary | ICD-10-CM | POA: Diagnosis not present

## 2022-10-30 DIAGNOSIS — Z8546 Personal history of malignant neoplasm of prostate: Secondary | ICD-10-CM | POA: Diagnosis not present

## 2022-10-30 DIAGNOSIS — Z79899 Other long term (current) drug therapy: Secondary | ICD-10-CM | POA: Diagnosis not present

## 2022-10-30 DIAGNOSIS — H4089 Other specified glaucoma: Secondary | ICD-10-CM | POA: Diagnosis not present

## 2022-10-30 DIAGNOSIS — E1139 Type 2 diabetes mellitus with other diabetic ophthalmic complication: Secondary | ICD-10-CM | POA: Diagnosis not present

## 2022-10-30 DIAGNOSIS — H4051X3 Glaucoma secondary to other eye disorders, right eye, severe stage: Secondary | ICD-10-CM | POA: Diagnosis not present

## 2022-10-30 DIAGNOSIS — I10 Essential (primary) hypertension: Secondary | ICD-10-CM | POA: Diagnosis not present

## 2022-11-17 DIAGNOSIS — H4051X4 Glaucoma secondary to other eye disorders, right eye, indeterminate stage: Secondary | ICD-10-CM | POA: Diagnosis not present

## 2022-11-17 DIAGNOSIS — H34831 Tributary (branch) retinal vein occlusion, right eye, with macular edema: Secondary | ICD-10-CM | POA: Diagnosis not present

## 2022-11-29 ENCOUNTER — Other Ambulatory Visit: Payer: Self-pay | Admitting: Internal Medicine

## 2022-12-14 DIAGNOSIS — H34831 Tributary (branch) retinal vein occlusion, right eye, with macular edema: Secondary | ICD-10-CM | POA: Diagnosis not present

## 2022-12-14 DIAGNOSIS — H4051X4 Glaucoma secondary to other eye disorders, right eye, indeterminate stage: Secondary | ICD-10-CM | POA: Diagnosis not present

## 2022-12-14 DIAGNOSIS — H401134 Primary open-angle glaucoma, bilateral, indeterminate stage: Secondary | ICD-10-CM | POA: Diagnosis not present

## 2022-12-14 DIAGNOSIS — H209 Unspecified iridocyclitis: Secondary | ICD-10-CM | POA: Diagnosis not present

## 2022-12-18 DIAGNOSIS — H34831 Tributary (branch) retinal vein occlusion, right eye, with macular edema: Secondary | ICD-10-CM | POA: Diagnosis not present

## 2022-12-18 DIAGNOSIS — H4051X4 Glaucoma secondary to other eye disorders, right eye, indeterminate stage: Secondary | ICD-10-CM | POA: Diagnosis not present

## 2022-12-20 ENCOUNTER — Other Ambulatory Visit: Payer: Self-pay | Admitting: Internal Medicine

## 2022-12-20 DIAGNOSIS — J3489 Other specified disorders of nose and nasal sinuses: Secondary | ICD-10-CM

## 2022-12-28 ENCOUNTER — Other Ambulatory Visit: Payer: Self-pay

## 2022-12-28 ENCOUNTER — Other Ambulatory Visit: Payer: Self-pay | Admitting: Emergency Medicine

## 2022-12-28 ENCOUNTER — Other Ambulatory Visit: Payer: Medicare HMO

## 2022-12-28 DIAGNOSIS — C61 Malignant neoplasm of prostate: Secondary | ICD-10-CM

## 2022-12-29 LAB — PSA: Prostate Specific Ag, Serum: <0.1 ng/mL (ref 0.0–4.0)

## 2023-01-01 ENCOUNTER — Ambulatory Visit: Payer: Medicare HMO | Admitting: Internal Medicine

## 2023-01-02 ENCOUNTER — Ambulatory Visit: Payer: Medicare HMO | Admitting: Urology

## 2023-01-02 VITALS — BP 161/84 | HR 85 | Ht 73.0 in | Wt 206.5 lb

## 2023-01-02 DIAGNOSIS — C61 Malignant neoplasm of prostate: Secondary | ICD-10-CM

## 2023-01-02 DIAGNOSIS — Z8546 Personal history of malignant neoplasm of prostate: Secondary | ICD-10-CM | POA: Diagnosis not present

## 2023-01-02 DIAGNOSIS — Z08 Encounter for follow-up examination after completed treatment for malignant neoplasm: Secondary | ICD-10-CM

## 2023-01-02 NOTE — Progress Notes (Signed)
I,Amy L Pierron,acting as a scribe for Vanna Scotland, MD.,have documented all relevant documentation on the behalf of Vanna Scotland, MD,as directed by  Vanna Scotland, MD while in the presence of Vanna Scotland, MD.  01/02/2023 12:31 PM   Rodney Suarez 1940-07-01 [id number]  Referring provider: Karie Schwalbe, MD [address] [place],  [place] [address]  Chief Complaint  Patient presents with   Prostate Cancer    HPI: 82 year-old male with a personal history of prostate cancer presents today for annual follow-up.  He is status post brachytherapy in 2010.   In the interim, he is undergone PSMA PET scan on 09/08/2020.  This shows concern for a focal local recurrence within the prostate at the right apex. There is no associated pelvic or metastatic disease.  There is an incidental left 4 mm intramuscular node of unclear significance.   He underwent a MRI of pelvis on 11/13/2020 that visualized a 0.5 cm in short axis left obturator lymph node appears to correspond most closely to the region of focal hypermetabolic activity along the left obturator fossa seen on recent Pylarify PET CT. This raises the concern of nodal involvement despite the small size of the lymph node   He started salvage radiation 12/02/2020.  He is not having any urinary symptoms or issues. However, he has recently been told his eyesight is failing.   PMH: Past Medical History:  Diagnosis Date   Allergy    BPH (benign prostatic hypertrophy)    Diabetes mellitus    no meds - diet controlled   ED (erectile dysfunction)    History of prostate cancer    Hx of adenomatous colonic polyps    Hyperlipidemia    Hypertension    Osteoarthritis    Wears dentures    Partial upper    Surgical History: Past Surgical History:  Procedure Laterality Date   APPENDECTOMY  1956   CATARACT EXTRACTION W/PHACO Right 06/07/2022   Procedure: CATARACT EXTRACTION PHACO AND INTRAOCULAR LENS PLACEMENT (IOC) RIGHT DIABETIC   9.45  00:54.9;  Surgeon: Lockie Mola, MD;  Location: [facility];  Service: Ophthalmology;  Laterality: Right;   FOOT FRACTURE SURGERY  1996   KNEE ARTHROPLASTY Right 03/06/2016   Procedure: COMPUTER ASSISTED TOTAL KNEE ARTHROPLASTY;  Surgeon: Donato Heinz, MD;  Location: [facility];  Service: Orthopedics;  Laterality: Right;   KNEE ARTHROSCOPY  11/2005   right knee   PROSTATE SURGERY  01/2009   I-131 implants   SINUS SURGERY WITH INSTATRAK  09/2007    Home Medications:  Allergies as of 01/02/2023       Reactions   Meloxicam Other (See Comments)   Cramps at night   Metformin    REACTION: diarrhea        Medication List        Accurate as of January 02, 2023 12:31 PM. If you have any questions, ask your nurse or doctor.          amLODipine 10 MG tablet Commonly known as: NORVASC TAKE 1 TABLET BY MOUTH EVERY DAY   aspirin EC 81 MG tablet Take 81 mg by mouth daily.   ketoconazole 2 % cream Commonly known as: NIZORAL APPLY TO AFFECTED AREA TWICE DAILY AS NEEDED FOR IRRITATION   Klor-Con M20 20 MEQ tablet Generic drug: potassium chloride SA TAKE 1 TABLET BY MOUTH EVERY DAY   lisinopril-hydrochlorothiazide 20-12.5 MG tablet Commonly known as: ZESTORETIC TAKE 2 TABLETS BY MOUTH EVERY DAY   LORazepam  0.5 MG tablet Commonly known as: ATIVAN TAKE 2 TO 4 TABLETS BY MOUTH AT BEDTIME AS NEEDED FOR SLEEP   mupirocin ointment 2 % Commonly known as: BACTROBAN APPLY 1 APPLICATION TOPICALLY 2 (TWO) TIMES DAILY. FOR A WEEK. IN THE NOSE   pravastatin 20 MG tablet Commonly known as: PRAVACHOL TAKE 1 TABLET BY MOUTH EVERY DAY   timolol 0.5 % ophthalmic solution Commonly known as: TIMOPTIC Place 1 drop into the right eye 2 (two) times daily.        Allergies:  Allergies  Allergen Reactions   Meloxicam Other (See Comments)    Cramps at night   Metformin     REACTION: diarrhea    Social History:  reports that he quit smoking about 11 years ago.  His smoking use included cigarettes. He started smoking about 66 years ago. He has a 27.5 pack-year smoking history. He has never been exposed to tobacco smoke. He has never used smokeless tobacco. He reports that he does not drink alcohol and does not use drugs.   Physical Exam: BP (!) 161/84   Pulse 85   Ht 6\' 1"  (1.854 m)   Wt 206 lb 8 oz (93.7 kg)   BMI 27.24 kg/m   Constitutional:  Alert and oriented, No acute distress. HEENT: [place] AT, moist mucus membranes.  Trachea midline, no masses. Neurologic: Grossly intact, no focal deficits, moving all 4 extremities. Psychiatric: Normal mood and affect.   Assessment & Plan:    1. Prostate cancer  - Recurrent, oligometastatic status post salvage IMRT and 6 months of ADT.    - Hist most recent PSA remains undetectable, down from 15 at the time of his recurrence.   - No evidence of disease and will continue to check PSA every 6 months.   Return in about 1 year (around 01/02/2024) for PSA.  I have reviewed the above documentation for accuracy and completeness, and I agree with the above.   Vanna Scotland, MD   [facility] [address] [place], [place] [address] [phone]

## 2023-02-19 ENCOUNTER — Other Ambulatory Visit: Payer: Self-pay | Admitting: Internal Medicine

## 2023-02-20 NOTE — Telephone Encounter (Signed)
Spoke to pt, scheduled mwv for 03/08/23

## 2023-02-20 NOTE — Telephone Encounter (Signed)
Pt is dues for a Medicare Wellness with Dr Alphonsus Sias. Please help him get scheduled. Thank you.

## 2023-03-08 ENCOUNTER — Encounter: Payer: Medicare HMO | Admitting: Internal Medicine

## 2023-03-09 ENCOUNTER — Other Ambulatory Visit: Payer: Self-pay | Admitting: Internal Medicine

## 2023-03-09 NOTE — Telephone Encounter (Signed)
Pt is past due for a Medicare Wellness Exam with Dr Alphonsus Sias. Medication was sent in for 90 days. Please help pt get scheduled with Dr Alphonsus Sias. Thank you.

## 2023-03-09 NOTE — Telephone Encounter (Signed)
 Attempted to call patient, unable to leave VM.

## 2023-03-16 DIAGNOSIS — H401134 Primary open-angle glaucoma, bilateral, indeterminate stage: Secondary | ICD-10-CM | POA: Diagnosis not present

## 2023-04-27 ENCOUNTER — Other Ambulatory Visit: Payer: Self-pay | Admitting: Internal Medicine

## 2023-05-21 ENCOUNTER — Other Ambulatory Visit: Payer: Self-pay | Admitting: Internal Medicine

## 2023-05-21 MED ORDER — LISINOPRIL-HYDROCHLOROTHIAZIDE 20-12.5 MG PO TABS: 2.0000 | ORAL_TABLET | Freq: Every day | ORAL | 0 refills | Status: DC

## 2023-05-21 NOTE — Telephone Encounter (Signed)
Spoke to pt, scheduled cpe for 06/18/23

## 2023-05-21 NOTE — Telephone Encounter (Signed)
Medication sent for 90 days. Past due for Medicare Wellness Exam. Please help him get scheduled. Thanks

## 2023-06-13 ENCOUNTER — Other Ambulatory Visit: Payer: Self-pay | Admitting: Internal Medicine

## 2023-06-14 ENCOUNTER — Other Ambulatory Visit: Payer: Self-pay | Admitting: *Deleted

## 2023-06-14 DIAGNOSIS — C61 Malignant neoplasm of prostate: Secondary | ICD-10-CM

## 2023-06-18 ENCOUNTER — Telehealth: Payer: Self-pay | Admitting: Internal Medicine

## 2023-06-18 ENCOUNTER — Ambulatory Visit: Payer: Medicare HMO | Admitting: Internal Medicine

## 2023-06-18 ENCOUNTER — Encounter: Payer: Self-pay | Admitting: Internal Medicine

## 2023-06-18 VITALS — BP 138/88 | HR 75 | Temp 98.6°F | Ht 71.0 in | Wt 202.0 lb

## 2023-06-18 DIAGNOSIS — I10 Essential (primary) hypertension: Secondary | ICD-10-CM

## 2023-06-18 DIAGNOSIS — E1159 Type 2 diabetes mellitus with other circulatory complications: Secondary | ICD-10-CM | POA: Diagnosis not present

## 2023-06-18 DIAGNOSIS — Z8546 Personal history of malignant neoplasm of prostate: Secondary | ICD-10-CM | POA: Diagnosis not present

## 2023-06-18 DIAGNOSIS — Z0000 Encounter for general adult medical examination without abnormal findings: Secondary | ICD-10-CM

## 2023-06-18 DIAGNOSIS — R413 Other amnesia: Secondary | ICD-10-CM

## 2023-06-18 LAB — HM DIABETES FOOT EXAM

## 2023-06-18 MED ORDER — KETOCONAZOLE 2 % EX CREA: TOPICAL_CREAM | CUTANEOUS | 1 refills | Status: AC

## 2023-06-18 NOTE — Assessment & Plan Note (Signed)
 Worsening considerably Likely early vascular dementia Will check labs and brain MRI

## 2023-06-18 NOTE — Assessment & Plan Note (Signed)
 Will recheck PSA

## 2023-06-18 NOTE — Progress Notes (Signed)
Hearing Screening - Comments:: Passed whisper test Vision Screening - Comments:: September 2024

## 2023-06-18 NOTE — Progress Notes (Signed)
 Subjective:    Patient ID: Rodney Suarez., male    DOB: 12-24-1940, 83 y.o.   MRN: [id number]  HPI Here for Medicare wellness visit and follow up of chronic health conditions With wife Reviewed advanced directives Reviewed other doctors---Dr Brandon--urology, Dr Josie Saunders, Dr Chrystal--radiation oncology, Dr Deno Lunger, Dr Wyn Quaker, [facility] Did lose sight in left eye and several surgeries--but has lost vision in that eye (some done at [facility]). Right eye is okay. No longer drives much  No alcohol or tobacco Not really exercising--but stays active in the yard No falls Wife does the shopping and housework  Only checks sugars sporadically--not for months He is doing a better job with eating right Has lost 15#--was working on it No foot burning or numbness  No recent labs but 2023 GFR 52  Has had some depression---after losing eyesight on left Had a rough time---and now some better Functionally better now Some degree of anhedonia--not able to travel like he could. Some improvement.  Wife notes that his memory has worsened Now having functional issues--but hard to tell what is visual or cognitive (may be more visual)  No chest pain or SOB No dizziness or syncope No palpitations No edema No headaches  Current Outpatient Medications on File Prior to Visit  Medication Sig Dispense Refill   amLODipine (NORVASC) 10 MG tablet TAKE 1 TABLET BY MOUTH EVERY DAY 90 tablet 0   aspirin EC 81 MG tablet Take 81 mg by mouth daily.     ketoconazole (NIZORAL) 2 % cream APPLY TO AFFECTED AREA TWICE DAILY AS NEEDED FOR IRRITATION 60 g 1   KLOR-CON M20 20 MEQ tablet TAKE 1 TABLET BY MOUTH EVERY DAY 90 tablet 3   lisinopril-hydrochlorothiazide (ZESTORETIC) 20-12.5 MG tablet Take 2 tablets by mouth daily. NEEDS OFFICE VISIT 180 tablet 0   pravastatin (PRAVACHOL) 20 MG tablet TAKE 1 TABLET BY MOUTH EVERY DAY 90 tablet 0   timolol (TIMOPTIC) 0.5 % ophthalmic solution  Place 1 drop into the right eye 2 (two) times daily.     No current facility-administered medications on file prior to visit.    Allergies  Allergen Reactions   Meloxicam Other (See Comments)    Cramps at night   Metformin     REACTION: diarrhea    Past Medical History:  Diagnosis Date   Allergy    BPH (benign prostatic hypertrophy)    Diabetes mellitus    no meds - diet controlled   ED (erectile dysfunction)    History of prostate cancer    Hx of adenomatous colonic polyps    Hyperlipidemia    Hypertension    Osteoarthritis    Wears dentures    Partial upper    Past Surgical History:  Procedure Laterality Date   APPENDECTOMY  1956   CATARACT EXTRACTION W/PHACO Right 06/07/2022   Procedure: CATARACT EXTRACTION PHACO AND INTRAOCULAR LENS PLACEMENT (IOC) RIGHT DIABETIC  9.45  00:54.9;  Surgeon: Lockie Mola, MD;  Location: [facility];  Service: Ophthalmology;  Laterality: Right;   FOOT FRACTURE SURGERY  1996   KNEE ARTHROPLASTY Right 03/06/2016   Procedure: COMPUTER ASSISTED TOTAL KNEE ARTHROPLASTY;  Surgeon: Donato Heinz, MD;  Location: [facility];  Service: Orthopedics;  Laterality: Right;   KNEE ARTHROSCOPY  11/2005   right knee   PROSTATE SURGERY  01/2009   I-131 implants   SINUS SURGERY WITH INSTATRAK  09/2007    History reviewed. No pertinent family history.  Social History   Socioeconomic History  Marital status: Married    Spouse name: Not on file   Number of children: 2   Years of education: Not on file   Highest education level: Not on file  Occupational History   Occupation: retired    Associate Professor: RETIRED    Comment: Publishing copy  Tobacco Use   Smoking status: Former    Current packs/day: 0.00    Average packs/day: 0.5 packs/day for 55.0 years (27.5 ttl pk-yrs)    Types: Cigarettes    Start date: 04/03/1956    Quit date: 04/04/2011    Years since quitting: 12.2    Passive exposure: Never   Smokeless tobacco: Never   Vaping Use   Vaping status: Never Used  Substance and Sexual Activity   Alcohol use: No   Drug use: No   Sexual activity: Not on file  Other Topics Concern   Not on file  Social History Narrative   No living will   Wife and daughter should be health care POAs   Discussed DNR--he requests this (done 10/14/12)   No tube feedings if cognitively unaware   Social Drivers of Corporate investment banker Strain: Not on file  Food Insecurity: Not on file  Transportation Needs: Not on file  Physical Activity: Not on file  Stress: Not on file  Social Connections: Not on file  Intimate Partner Violence: Not on file   Review of Systems Appetite is okay Sleeps okay--stopped sleep meds Wears seat belt Ongoing teeth problems---started with dentist recently No heartburn or dysphagia Bowels move okay---no blood Voids okay.  No suspicious skin lesions---just dry skin. Uses moisturizer No sig back or joint pains now     Objective:   Physical Exam Constitutional:      Appearance: Normal appearance.  HENT:     Mouth/Throat:     Pharynx: No oropharyngeal exudate or posterior oropharyngeal erythema.  Eyes:     Conjunctiva/sclera: Conjunctivae normal.     Pupils: Pupils are equal, round, and reactive to light.  Cardiovascular:     Rate and Rhythm: Normal rate and regular rhythm.     Pulses: Normal pulses.     Heart sounds: No murmur heard.    No gallop.  Pulmonary:     Effort: Pulmonary effort is normal.     Breath sounds: Normal breath sounds. No wheezing or rales.  Abdominal:     Palpations: Abdomen is soft.     Tenderness: There is no abdominal tenderness.  Musculoskeletal:     Cervical back: Neck supple.     Right lower leg: No edema.     Left lower leg: No edema.  Lymphadenopathy:     Cervical: No cervical adenopathy.  Skin:    Findings: No lesion or rash.  Neurological:     General: No focal deficit present.     Mental Status: He is alert and oriented to person,  place, and time.     Comments: Word naming 7/1 minute Recall 0/3 Sensation in feet fairly normal  Psychiatric:        Mood and Affect: Mood normal.        Behavior: Behavior normal.            Assessment & Plan:

## 2023-06-18 NOTE — Assessment & Plan Note (Signed)
 BP Readings from Last 3 Encounters:  06/18/23 138/88  01/02/23 (!) 161/84  06/29/22 135/76   Controlled on lisinopril/hydrochlorothiazide 40/25

## 2023-06-18 NOTE — Assessment & Plan Note (Signed)
 I have personally reviewed the Medicare Annual Wellness questionnaire and have noted 1. The patient's medical and social history 2. Their use of alcohol, tobacco or illicit drugs 3. Their current medications and supplements 4. The patient's functional ability including ADL's, fall risks, home safety risks and hearing or visual             impairment. 5. Diet and physical activities 6. Evidence for depression or mood disorders  The patients weight, height, BMI and visual acuity have been recorded in the chart I have made referrals, counseling and provided education to the patient based review of the above and I have provided the pt with a written personalized care plan for preventive services.  I have provided you with a copy of your personalized plan for preventive services. Please take the time to review along with your updated medication list.  Done with cancer screening Discussed staying active Flu, COVID and RSV vaccines for next fall Consider shingrix at pharmacy Done with cancer screening

## 2023-06-18 NOTE — Telephone Encounter (Signed)
 Called to obtain insurance information.  Need the ID # From the [employer]

## 2023-06-18 NOTE — Addendum Note (Signed)
 Addended by: Tillman Abide I on: 06/18/2023 03:25 PM   Modules accepted: Orders

## 2023-06-18 NOTE — Assessment & Plan Note (Signed)
 Hasn't been checking lately Hopefully still okay without meds

## 2023-06-19 LAB — HEPATIC FUNCTION PANEL
ALT: 14 U/L (ref 0–53)
AST: 24 U/L (ref 0–37)
Albumin: 4.6 g/dL (ref 3.5–5.2)
Alkaline Phosphatase: 65 U/L (ref 39–117)
Bilirubin, Direct: 0.3 mg/dL (ref 0.0–0.3)
Total Bilirubin: 1.7 mg/dL — ABNORMAL HIGH (ref 0.2–1.2)
Total Protein: 7.3 g/dL (ref 6.0–8.3)

## 2023-06-19 LAB — CBC
HCT: 41.8 % (ref 39.0–52.0)
Hemoglobin: 14.4 g/dL (ref 13.0–17.0)
MCHC: 34.4 g/dL (ref 30.0–36.0)
MCV: 98 fl (ref 78.0–100.0)
Platelets: 191 10*3/uL (ref 150.0–400.0)
RBC: 4.27 Mil/uL (ref 4.22–5.81)
RDW: 14.3 % (ref 11.5–15.5)
WBC: 4.2 10*3/uL (ref 4.0–10.5)

## 2023-06-19 LAB — RENAL FUNCTION PANEL
Albumin: 4.6 g/dL (ref 3.5–5.2)
BUN: 14 mg/dL (ref 6–23)
CO2: 29 meq/L (ref 19–32)
Calcium: 9.4 mg/dL (ref 8.4–10.5)
Chloride: 101 meq/L (ref 96–112)
Creatinine, Ser: 1.14 mg/dL (ref 0.40–1.50)
GFR: 59.06 mL/min — ABNORMAL LOW (ref >60.00)
Glucose, Bld: 87 mg/dL (ref 70–99)
Phosphorus: 2.8 mg/dL (ref 2.3–4.6)
Potassium: 3.7 meq/L (ref 3.5–5.1)
Sodium: 139 meq/L (ref 135–145)

## 2023-06-19 LAB — MICROALBUMIN / CREATININE URINE RATIO
Creatinine,U: 172.3 mg/dL
Microalb Creat Ratio: 41.9 mg/g — ABNORMAL HIGH (ref 0.0–30.0)
Microalb, Ur: 7.2 mg/dL — ABNORMAL HIGH (ref 0.0–1.9)

## 2023-06-19 LAB — LIPID PANEL
Cholesterol: 209 mg/dL — ABNORMAL HIGH (ref 0–200)
HDL: 62.4 mg/dL (ref >39.00)
LDL Cholesterol: 134 mg/dL — ABNORMAL HIGH (ref 0–99)
NonHDL: 146.47
Total CHOL/HDL Ratio: 3
Triglycerides: 60 mg/dL (ref 0.0–149.0)
VLDL: 12 mg/dL (ref 0.0–40.0)

## 2023-06-19 LAB — HEMOGLOBIN A1C: Hgb A1c MFr Bld: 6.7 % — ABNORMAL HIGH (ref 4.6–6.5)

## 2023-06-19 LAB — TSH: TSH: 3.18 u[IU]/mL (ref 0.35–5.50)

## 2023-06-19 LAB — PSA: PSA: 0.18 ng/mL (ref 0.10–4.00)

## 2023-06-19 LAB — VITAMIN B12: Vitamin B-12: 253 pg/mL (ref 211–911)

## 2023-06-21 ENCOUNTER — Inpatient Hospital Stay: Payer: Medicare HMO | Attending: Radiation Oncology

## 2023-06-28 ENCOUNTER — Ambulatory Visit: Payer: Medicare HMO | Attending: Radiation Oncology | Admitting: Radiation Oncology

## 2023-06-29 ENCOUNTER — Other Ambulatory Visit: Payer: Self-pay

## 2023-07-13 ENCOUNTER — Other Ambulatory Visit: Payer: Self-pay | Admitting: Internal Medicine

## 2023-07-17 ENCOUNTER — Ambulatory Visit

## 2023-07-20 ENCOUNTER — Ambulatory Visit: Admission: RE | Admit: 2023-07-20 | Source: Ambulatory Visit

## 2023-08-06 DIAGNOSIS — N1831 Chronic kidney disease, stage 3a: Secondary | ICD-10-CM | POA: Diagnosis not present

## 2023-08-06 DIAGNOSIS — Z008 Encounter for other general examination: Secondary | ICD-10-CM | POA: Diagnosis not present

## 2023-08-06 DIAGNOSIS — E663 Overweight: Secondary | ICD-10-CM | POA: Diagnosis not present

## 2023-08-06 DIAGNOSIS — E1169 Type 2 diabetes mellitus with other specified complication: Secondary | ICD-10-CM | POA: Diagnosis not present

## 2023-08-06 DIAGNOSIS — H4051X3 Glaucoma secondary to other eye disorders, right eye, severe stage: Secondary | ICD-10-CM | POA: Diagnosis not present

## 2023-08-06 DIAGNOSIS — E785 Hyperlipidemia, unspecified: Secondary | ICD-10-CM | POA: Diagnosis not present

## 2023-08-06 DIAGNOSIS — Z8546 Personal history of malignant neoplasm of prostate: Secondary | ICD-10-CM | POA: Diagnosis not present

## 2023-08-06 DIAGNOSIS — E1122 Type 2 diabetes mellitus with diabetic chronic kidney disease: Secondary | ICD-10-CM | POA: Diagnosis not present

## 2023-08-06 DIAGNOSIS — Z6829 Body mass index (BMI) 29.0-29.9, adult: Secondary | ICD-10-CM | POA: Diagnosis not present

## 2023-08-15 ENCOUNTER — Other Ambulatory Visit: Payer: Self-pay | Admitting: Internal Medicine

## 2023-09-11 ENCOUNTER — Other Ambulatory Visit: Payer: Self-pay | Admitting: Internal Medicine

## 2023-10-26 ENCOUNTER — Encounter: Payer: Self-pay | Admitting: Internal Medicine

## 2023-11-15 ENCOUNTER — Encounter: Payer: Self-pay | Admitting: Urology

## 2023-11-20 ENCOUNTER — Encounter: Payer: Self-pay | Admitting: Urology

## 2023-12-25 NOTE — Addendum Note (Signed)
 Addended byBETHA CORIE PLATER on: 12/25/2023 03:24 PM   Modules accepted: Orders

## 2023-12-28 ENCOUNTER — Other Ambulatory Visit: Payer: Self-pay

## 2023-12-31 NOTE — Progress Notes (Unsigned)
   01/04/2024 3:38 PM   Rodney Suarez. 04/19/1940 [id number]  Reason for visit: Follow up prostate Ca   HPI: Initial follow up with me today, previously seen by Dr. Penne in Oct 2024  Prior HPI: Status post brachytherapy in 2010.  Initial pathology unknown   PSMA/PET (09/08/2020) - concern for a focal local recurrence within the prostate at the right apex. There is no associated pelvic or metastatic disease.  There is an incidental left 4 mm intramuscular node of unclear significance.   MRIpelvis (11/13/2020) - ~0.5 cm in short axis left obturator lymph node appears to correspond most closely to the region of focal hypermetabolic activity along the left obturator fossa seen on recent Pylarify  PET CT. This raises the concern of nodal involvement despite the small size of the lymph node   S/p salvage radiation 12/02/2020  Physical Exam: There were no vitals taken for this visit.   Constitutional:  Alert and oriented, No acute distress.   Laboratory Data:  Latest Reference Range & Units 04/01/12 12:23 05/12/20 11:28 06/29/20 09:15 10/05/20 16:34 05/11/21 10:57 11/08/21 14:12 06/27/22 08:45 12/28/22 14:51 06/18/23 15:24  PSA 0.10 - 4.00 ng/mL 0.67        0.18  Prostate Specific Ag, Serum 0.0 - 4.0 ng/mL  12.7 (H) 11.3 (H) 15.0 (H)   <0.1 <0.1   Prostatic Specific Antigen 0.00 - 4.00 ng/mL     0.02 0.02     (H): Data is abnormally high  Pertinent Imaging: N/A   Assessment & Plan:    Prostate cancer [facility]) Assessment & Plan: Recurrent oligometastatic HSPC    - S/p brachytherapy in 2010.    - Initial pathology unknown   - Regional Left obturator node recurrence (2022), w/ PSA ~15   - s/p salvage IMRT (12/02/2020) + 6 mo ADT  PSAs undetectable after salvage  Last PSA 0.18 (Mar 2025)   - now w/ marginally detectable PSA since salvage in 2022 - considering overall health status /comorbidity--        Penne JONELLE Skye, MD  [facility] [address]  [address] [place], [place] [address] [phone]

## 2023-12-31 NOTE — Assessment & Plan Note (Addendum)
 Recurrent oligometastatic HSPC    - S/p brachytherapy in 2010.    - Initial pathology unknown   - Regional Left obturator node recurrence (2022), w/ PSA ~15   - s/p salvage IMRT (12/02/2020) + 6 mo ADT  PSAs undetectable after salvage  Last PSA 0.18 (Mar 2025)   - now w/ marginally detectable PSA since salvage in 2022 - considering overall health status /comorbidity--

## 2024-01-01 ENCOUNTER — Ambulatory Visit: Payer: Self-pay | Admitting: Urology

## 2024-01-03 ENCOUNTER — Ambulatory Visit: Admitting: Urology

## 2024-01-04 ENCOUNTER — Ambulatory Visit (INDEPENDENT_AMBULATORY_CARE_PROVIDER_SITE_OTHER): Admitting: Urology

## 2024-01-04 VITALS — BP 186/92 | HR 73 | Ht 73.0 in | Wt 193.2 lb

## 2024-01-04 DIAGNOSIS — C61 Malignant neoplasm of prostate: Secondary | ICD-10-CM | POA: Diagnosis not present

## 2024-01-05 LAB — PSA: Prostate Specific Ag, Serum: 0.3 ng/mL (ref 0.0–4.0)

## 2024-04-30 ENCOUNTER — Ambulatory Visit: Admitting: Family Medicine

## 2024-05-02 ENCOUNTER — Other Ambulatory Visit

## 2024-06-02 ENCOUNTER — Other Ambulatory Visit

## 2024-06-05 ENCOUNTER — Ambulatory Visit: Admitting: Urology
